# Patient Record
Sex: Male | Born: 1960 | Race: White | Hispanic: No | Marital: Married | Smoking: Never smoker
Health system: Southern US, Community
[De-identification: ages and names within clinical notes are randomized; demographics above are authoritative.]

## PROBLEM LIST (undated history)

## (undated) DIAGNOSIS — I10 Essential (primary) hypertension: Secondary | ICD-10-CM

## (undated) DIAGNOSIS — N200 Calculus of kidney: Secondary | ICD-10-CM

## (undated) DIAGNOSIS — C4491 Basal cell carcinoma of skin, unspecified: Secondary | ICD-10-CM

## (undated) HISTORY — DX: Basal cell carcinoma of skin, unspecified: C44.91

## (undated) HISTORY — PX: ROTATOR CUFF REPAIR: SHX139

---

## 2008-08-04 ENCOUNTER — Ambulatory Visit: Payer: Self-pay | Admitting: Orthopedic Surgery

## 2008-08-04 ENCOUNTER — Emergency Department (HOSPITAL_COMMUNITY): Admission: EM | Admit: 2008-08-04 | Discharge: 2008-08-04 | Payer: Self-pay | Admitting: Orthopedic Surgery

## 2008-08-08 ENCOUNTER — Ambulatory Visit: Payer: Self-pay | Admitting: Orthopedic Surgery

## 2008-08-08 DIAGNOSIS — S61409A Unspecified open wound of unspecified hand, initial encounter: Secondary | ICD-10-CM | POA: Insufficient documentation

## 2008-08-11 ENCOUNTER — Encounter: Payer: Self-pay | Admitting: Orthopedic Surgery

## 2008-08-22 ENCOUNTER — Telehealth: Payer: Self-pay | Admitting: Orthopedic Surgery

## 2008-08-30 ENCOUNTER — Encounter: Payer: Self-pay | Admitting: Orthopedic Surgery

## 2008-09-01 ENCOUNTER — Encounter: Payer: Self-pay | Admitting: Orthopedic Surgery

## 2011-02-11 NOTE — Consult Note (Signed)
NAME:  Steve Smith, Steve Smith NO.:  [id number]   MEDICAL RECORD NO.:  [id number]          PATIENT TYPE:  EMS   LOCATION:  ED                            FACILITY:  [facility]   PHYSICIAN:  Vickki Hearing, M.D.DATE OF BIRTH:  1961-05-02   DATE OF CONSULTATION:  08/04/2008  DATE OF DISCHARGE:  08/04/2008                                 CONSULTATION   CHIEF COMPLAINT:  Laceration right hand.   HISTORY:  This is a 50 year old male who was referred to me by Dr.  Regino Schultze.  The patient's wife works for Dr. Regino Schultze. He lacerated his  hand working with a printer at work and presented to Dr. Regino Schultze and Dr.  Regino Schultze  felt the laceration was too deep for closed in the office.  He  called me and I did not have office hours so I sent the patient to the  emergency room for me to evaluate.   He is essentially healthy with no major medical problems.  No recent  surgery.   REVIEW OF SYSTEMS:  Negative.   ALLERGIES:  No known drug allergies.   PHYSICAL EXAMINATION:  VITAL SIGNS: Stable.  GENERAL:  Appearance was normal.  CARDIOVASCULAR:  Intact.  SKIN:  Was noted for a laceration over the right thenar eminence.  It  was approximately 5 cm.  It was jagged.  It was angular in a triangular  shape.  MUSCULOSKELETAL: It did not appear to violate any tendons,  just muscle.  He had intact opposition and flexion at the IP joint of the right thumb.  There were no sensory deficits.  NEUROLOGIC: The patient was awake and alert, oriented x3.  Mood and  affect were normal.   IMPRESSION:  Laceration, right hand, thenar eminence.   PLAN:  Irrigation and closure.   The patient's tetanus was updated. Antibiotics were given.      Vickki Hearing, M.D.  Electronically Signed     SEH/MEDQ  D:  09/01/2008  T:  09/01/2008  Job:  [id number]

## 2011-02-14 NOTE — Op Note (Signed)
NAME:  LUCCA, BALLO NO.:  [id number]   MEDICAL RECORD NO.:  [id number]          PATIENT TYPE:  EMS   LOCATION:  ED                            FACILITY:  [facility]   PHYSICIAN:  Vickki Hearing, M.D.DATE OF BIRTH:  September 17, 1961   DATE OF PROCEDURE:  09/01/2008  DATE OF DISCHARGE:  08/04/2008                               OPERATIVE REPORT   PREPROCEDURE DIAGNOSIS:  Laceration of right hand.   POSTPROCEDURE DIAGNOSIS:  Laceration of right hand.   PROCEDURE:  Closure and irrigation or right hand laceration.   SURGEON:  Vickki Hearing, M.D.   ASSISTANT:  None.   ANESTHESIA:  Local 1% lidocaine block.   DESCRIPTION OF PROCEDURE:  Informed consent was obtained verbally.  The  wound was irrigated with saline and Betadine.  The hand was prepped, the  skin edges were injected with 1% lidocaine plain and then 3-0 nylon  suture was used to close the wound.  It was dressed sterilely, the  patient was discharged home on p.o. antibiotics with a followup  scheduled in my office to check the wound.      Vickki Hearing, M.D.  Electronically Signed     SEH/MEDQ  D:  09/01/2008  T:  09/01/2008  Job:  [id number]

## 2012-01-03 ENCOUNTER — Encounter (HOSPITAL_COMMUNITY): Payer: Self-pay | Admitting: Emergency Medicine

## 2012-01-03 ENCOUNTER — Emergency Department (HOSPITAL_COMMUNITY)
Admission: EM | Admit: 2012-01-03 | Discharge: 2012-01-03 | Disposition: A | Discharge status: Home / Self Care | Payer: 59 | Attending: Emergency Medicine | Admitting: Emergency Medicine

## 2012-01-03 ENCOUNTER — Emergency Department (HOSPITAL_COMMUNITY): Payer: 59

## 2012-01-03 DIAGNOSIS — R109 Unspecified abdominal pain: Secondary | ICD-10-CM | POA: Insufficient documentation

## 2012-01-03 DIAGNOSIS — N200 Calculus of kidney: Secondary | ICD-10-CM | POA: Insufficient documentation

## 2012-01-03 DIAGNOSIS — I10 Essential (primary) hypertension: Secondary | ICD-10-CM | POA: Insufficient documentation

## 2012-01-03 DIAGNOSIS — R112 Nausea with vomiting, unspecified: Secondary | ICD-10-CM | POA: Insufficient documentation

## 2012-01-03 HISTORY — DX: Calculus of kidney: N20.0

## 2012-01-03 HISTORY — DX: Essential (primary) hypertension: I10

## 2012-01-03 LAB — CBC
Hemoglobin: 15.4 g/dL (ref 13.0–17.0)
MCH: 30.7 pg (ref 26.0–34.0)
MCHC: 34.3 g/dL (ref 30.0–36.0)
MCV: 89.4 fL (ref 78.0–100.0)
Platelets: 227 10*3/uL (ref 150–400)

## 2012-01-03 LAB — BASIC METABOLIC PANEL
BUN: 19 mg/dL (ref 6–23)
Calcium: 9.5 mg/dL (ref 8.4–10.5)
GFR calc Af Amer: 70 mL/min — ABNORMAL LOW (ref >90)
GFR calc non Af Amer: 61 mL/min — ABNORMAL LOW (ref >90)
Potassium: 4.1 mEq/L (ref 3.5–5.1)

## 2012-01-03 LAB — URINALYSIS, ROUTINE W REFLEX MICROSCOPIC
Bilirubin Urine: NEGATIVE
Ketones, ur: NEGATIVE mg/dL
Nitrite: NEGATIVE
Urobilinogen, UA: 0.2 mg/dL (ref 0.0–1.0)

## 2012-01-03 LAB — DIFFERENTIAL
Basophils Relative: 0 % (ref 0–1)
Eosinophils Absolute: 0 10*3/uL (ref 0.0–0.7)
Eosinophils Relative: 1 % (ref 0–5)
Monocytes Relative: 5 % (ref 3–12)
Neutrophils Relative %: 78 % — ABNORMAL HIGH (ref 43–77)

## 2012-01-03 LAB — URINE MICROSCOPIC-ADD ON

## 2012-01-03 MED ORDER — OXYCODONE-ACETAMINOPHEN 5-325 MG PO TABS: 1.0000 | ORAL_TABLET | ORAL | Status: AC | PRN

## 2012-01-03 MED ORDER — HYDROMORPHONE HCL PF 1 MG/ML IJ SOLN
1.0000 mg | Freq: Once | INTRAMUSCULAR | Status: AC
  Administered 2012-01-03: 1 mg via INTRAVENOUS
  Filled 2012-01-03: qty 1

## 2012-01-03 MED ORDER — ONDANSETRON HCL 4 MG/2ML IJ SOLN
4.0000 mg | Freq: Once | INTRAMUSCULAR | Status: AC
  Administered 2012-01-03: 4 mg via INTRAVENOUS
  Filled 2012-01-03: qty 2

## 2012-01-03 MED ORDER — ONDANSETRON HCL 4 MG PO TABS: 4.0000 mg | ORAL_TABLET | Freq: Four times a day (QID) | ORAL | Status: AC

## 2012-01-03 NOTE — ED Provider Notes (Signed)
History     CSN: [id number]  Arrival date & time 01/03/12  4098   First MD Initiated Contact with Patient 01/03/12 1005      Chief Complaint  Patient presents with  . Flank Pain    (Consider location/radiation/quality/duration/timing/severity/associated sxs/prior treatment) HPI Comments: Patient complains of sudden onset of left flank pain that began earlier this morning. States that the pain is sharp and radiates to his left groin. He also reports nausea this morning and 1 episode of vomiting. Patient states that he has had previous kidney stones in his pain this morning feels similar to previous stones. He denies difficulty urinating or hematuria. He also denies fever and abdominal pain.  Patient is a 51 y.o. male presenting with flank pain. The history is provided by the patient.  Flank Pain This is a new problem. The current episode started today. The problem occurs constantly. The problem has been unchanged. Associated symptoms include nausea and vomiting. Pertinent negatives include no abdominal pain, arthralgias, chest pain, coughing, fever, headaches, neck pain, numbness, urinary symptoms or weakness. The symptoms are aggravated by nothing. He has tried nothing for the symptoms. The treatment provided no relief.    Past Medical History  Diagnosis Date  . Hypertension   . Kidney stone     Past Surgical History  Procedure Date  . Rotator cuff repair     Left    History reviewed. No pertinent family history.  History  Substance Use Topics  . Smoking status: Never Smoker   . Smokeless tobacco: Not on file  . Alcohol Use: No      Review of Systems  Constitutional: Negative for fever, activity change and appetite change.  HENT: Negative for neck pain.   Respiratory: Negative for cough and shortness of breath.   Cardiovascular: Negative for chest pain.  Gastrointestinal: Positive for nausea and vomiting. Negative for abdominal pain and diarrhea.  Genitourinary:  Positive for flank pain. Negative for dysuria, hematuria, decreased urine volume, scrotal swelling and difficulty urinating.  Musculoskeletal: Negative for arthralgias.  Skin: Negative.   Neurological: Negative for weakness, numbness and headaches.  All other systems reviewed and are negative.    Allergies  Review of patient's allergies indicates no known allergies.  Home Medications  No current outpatient prescriptions on file.  BP 157/88  Pulse 65  Temp(Src) 98.4 F (36.9 C) (Oral)  Resp 18  Ht 6\' 4"  (1.93 m)  Wt 260 lb (117.935 kg)  BMI 31.65 kg/m2  SpO2 98%  Physical Exam  Nursing note and vitals reviewed. Constitutional: He is oriented to person, place, and time. He appears well-developed and well-nourished. No distress.  HENT:  Head: Normocephalic and atraumatic.  Mouth/Throat: Oropharynx is clear and moist.  Cardiovascular: Normal rate, regular rhythm, normal heart sounds and intact distal pulses.   No murmur heard. Pulmonary/Chest: Effort normal and breath sounds normal. No respiratory distress.  Abdominal: Soft. Normal appearance. He exhibits no distension and no mass. There is tenderness. There is CVA tenderness. There is no rebound and no guarding.  Musculoskeletal: Normal range of motion. He exhibits no edema and no tenderness.  Neurological: He is alert and oriented to person, place, and time. He exhibits normal muscle tone. Coordination normal.  Skin: Skin is warm and dry.    ED Course  Procedures (including critical care time)  Labs Reviewed  URINALYSIS, ROUTINE W REFLEX MICROSCOPIC - Abnormal; Notable for the following:    APPearance HAZY (*)    Specific Gravity, Urine >1.030 (*)  Hgb urine dipstick LARGE (*)    Protein, ur TRACE (*)    All other components within normal limits  DIFFERENTIAL - Abnormal; Notable for the following:    Neutrophils Relative 78 (*)    All other components within normal limits  BASIC METABOLIC PANEL - Abnormal; Notable  for the following:    Glucose, Bld 136 (*)    GFR calc non Af Amer 61 (*)    GFR calc Af Amer 70 (*)    All other components within normal limits  URINE MICROSCOPIC-ADD ON - Abnormal; Notable for the following:    Bacteria, UA FEW (*)    All other components within normal limits  CBC    Ct Abdomen Pelvis Wo Contrast  01/03/2012  *RADIOLOGY REPORT*  Clinical Data: Left-sided flank pain and hematuria.  Nausea and vomiting.  History of renal calculi.  CT ABDOMEN AND PELVIS WITHOUT CONTRAST  Technique:  Multidetector CT imaging of the abdomen and pelvis was performed following the standard protocol without intravenous contrast.  Comparison: None.  Findings: Mild left hydronephrosis and hydroureter present.  There is a calculus located either at the UVJ or just beyond the UVJ into the bladder.  This small calculus measures approximately 3 mm in diameter.  No other renal calculi.  Calcified granuloma noted at the posterior left lung base.  Several small calcified granulomata are also present in the spleen.  Other unenhanced solid organs are unremarkable.  Bowel loops are normal in caliber and show no evidence of obstruction or inflammation. The appendix is normal in appearance.  Small benign mesenteric calcification noted in the anterior pelvis.  No evidence of hernias, masses or enlarged lymph nodes.  Mild degenerative changes of the lower thoracic spine.  IMPRESSION: Mild left hydronephrosis and hydroureter due to a 3 mm calculus located either at the ureterovesicle junction or just in the lumen of the bladder.  Original Report Authenticated By: Reola Calkins, M.D.      MDM   Vital signs are stable. Patient appears uncomfortable. Left flank pain that began suddenly at 3 AM this morning patient has a history of kidney stones and states his pain is similar to previous.  I will treat with IV Dilaudid and Zofran.  Patient has received IVF's, Dilaudid, and zofran.  He is feeling better.  Vitals remain  stable, pain improved.  No vomiting during ed stay.  Pt has a 3mm  Stone at UVJ that he will likely pass.  I will also give referal for urology , he also agrees to return here if sx's do not improve  Patient / Family / Caregiver understand and agree with initial ED impression and plan with expectations set for ED visit. Pt stable in ED with no significant deterioration in condition. Pt feels improved after observation and/or treatment in ED.       Adlyn Fife L. [place], [place] 01/06/12 1633

## 2012-01-03 NOTE — ED Notes (Signed)
Patient with c/o left flank pain starting 0300 today. History of Kidney Stones. +nausea/vomiting

## 2012-01-03 NOTE — Discharge Instructions (Signed)

## 2012-01-06 NOTE — ED Provider Notes (Signed)
Medical screening examination/treatment/procedure(s) were performed by non-physician practitioner and as supervising physician I was immediately available for consultation/collaboration.   Wilhemenia Camba W Arcadia Gorgas, MD 01/06/12 2246 

## 2016-02-13 ENCOUNTER — Encounter (HOSPITAL_COMMUNITY): Payer: Self-pay | Admitting: *Deleted

## 2016-02-13 ENCOUNTER — Emergency Department (HOSPITAL_COMMUNITY)
Admission: EM | Admit: 2016-02-13 | Discharge: 2016-02-13 | Disposition: A | Discharge status: Home / Self Care | Payer: 59 | Attending: Emergency Medicine | Admitting: Emergency Medicine

## 2016-02-13 DIAGNOSIS — R109 Unspecified abdominal pain: Secondary | ICD-10-CM | POA: Diagnosis present

## 2016-02-13 DIAGNOSIS — N200 Calculus of kidney: Secondary | ICD-10-CM

## 2016-02-13 DIAGNOSIS — I10 Essential (primary) hypertension: Secondary | ICD-10-CM | POA: Insufficient documentation

## 2016-02-13 DIAGNOSIS — R112 Nausea with vomiting, unspecified: Secondary | ICD-10-CM | POA: Insufficient documentation

## 2016-02-13 LAB — URINALYSIS, ROUTINE W REFLEX MICROSCOPIC
BILIRUBIN URINE: NEGATIVE
Glucose, UA: NEGATIVE mg/dL
KETONES UR: NEGATIVE mg/dL
LEUKOCYTES UA: NEGATIVE
NITRITE: NEGATIVE
PROTEIN: NEGATIVE mg/dL
Specific Gravity, Urine: 1.025 (ref 1.005–1.030)
pH: 5.5 (ref 5.0–8.0)

## 2016-02-13 LAB — BASIC METABOLIC PANEL
Anion gap: 9 (ref 5–15)
BUN: 24 mg/dL — ABNORMAL HIGH (ref 6–20)
CALCIUM: 9 mg/dL (ref 8.9–10.3)
CHLORIDE: 105 mmol/L (ref 101–111)
CO2: 24 mmol/L (ref 22–32)
CREATININE: 1.33 mg/dL — AB (ref 0.61–1.24)
GFR, EST NON AFRICAN AMERICAN: 60 mL/min — AB (ref >60)
Glucose, Bld: 149 mg/dL — ABNORMAL HIGH (ref 65–99)
Potassium: 3.8 mmol/L (ref 3.5–5.1)
Sodium: 138 mmol/L (ref 135–145)

## 2016-02-13 LAB — URINE MICROSCOPIC-ADD ON
Squamous Epithelial / LPF: NONE SEEN
WBC UA: NONE SEEN WBC/hpf (ref 0–5)

## 2016-02-13 LAB — CBC WITH DIFFERENTIAL/PLATELET
BASOS PCT: 0 %
Basophils Absolute: 0 10*3/uL (ref 0.0–0.1)
Eosinophils Absolute: 0.1 10*3/uL (ref 0.0–0.7)
Eosinophils Relative: 1 %
HEMATOCRIT: 43.9 % (ref 39.0–52.0)
HEMOGLOBIN: 14.8 g/dL (ref 13.0–17.0)
LYMPHS ABS: 2 10*3/uL (ref 0.7–4.0)
LYMPHS PCT: 26 %
MCH: 30.3 pg (ref 26.0–34.0)
MCHC: 33.7 g/dL (ref 30.0–36.0)
MCV: 90 fL (ref 78.0–100.0)
MONOS PCT: 5 %
Monocytes Absolute: 0.4 10*3/uL (ref 0.1–1.0)
NEUTROS ABS: 5.3 10*3/uL (ref 1.7–7.7)
NEUTROS PCT: 68 %
Platelets: 209 10*3/uL (ref 150–400)
RBC: 4.88 MIL/uL (ref 4.22–5.81)
RDW: 13.4 % (ref 11.5–15.5)
WBC: 7.7 10*3/uL (ref 4.0–10.5)

## 2016-02-13 MED ORDER — KETOROLAC TROMETHAMINE 30 MG/ML IJ SOLN
30.0000 mg | Freq: Once | INTRAMUSCULAR | Status: AC
  Administered 2016-02-13: 30 mg via INTRAVENOUS
  Filled 2016-02-13: qty 1

## 2016-02-13 MED ORDER — ONDANSETRON HCL 4 MG/2ML IJ SOLN
4.0000 mg | Freq: Once | INTRAMUSCULAR | Status: AC
  Administered 2016-02-13: 4 mg via INTRAVENOUS

## 2016-02-13 MED ORDER — HYDROMORPHONE HCL 1 MG/ML IJ SOLN
1.0000 mg | Freq: Once | INTRAMUSCULAR | Status: AC
  Administered 2016-02-13: 1 mg via INTRAVENOUS

## 2016-02-13 MED ORDER — ONDANSETRON 4 MG PO TBDP: 4.0000 mg | ORAL_TABLET | Freq: Three times a day (TID) | ORAL | Status: DC | PRN

## 2016-02-13 MED ORDER — OXYCODONE-ACETAMINOPHEN 5-325 MG PO TABS
1.0000 | ORAL_TABLET | Freq: Once | ORAL | Status: AC
  Administered 2016-02-13: 1 via ORAL
  Filled 2016-02-13: qty 1

## 2016-02-13 MED ORDER — TAMSULOSIN HCL 0.4 MG PO CAPS: 0.4000 mg | ORAL_CAPSULE | Freq: Every day | ORAL | Status: DC

## 2016-02-13 MED ORDER — OXYCODONE-ACETAMINOPHEN 5-325 MG PO TABS: 1.0000 | ORAL_TABLET | Freq: Four times a day (QID) | ORAL | Status: DC | PRN

## 2016-02-13 NOTE — ED Provider Notes (Signed)
CSN: [id number]     Arrival date & time 02/13/16  0115 History   None    Chief Complaint  Patient presents with  . Flank Pain     (Consider location/radiation/quality/duration/timing/severity/associated sxs/prior Treatment) HPI  This is a 55 year old male with a history of hypertension and kidney stones who presents with right flank pain. Onset of symptoms tonight. He reports pain that originates in his right flank and radiates into his right groin. He states that it is sharp. It is currently 10 out of 10. Nothing seems to make it better or worse. He denies any hematuria or dysuria. This feels like his prior kidney stones. Denies any fevers. Does report vomiting. No diarrhea or other symptoms.  Past Medical History  Diagnosis Date  . Hypertension   . Kidney stone    Past Surgical History  Procedure Laterality Date  . Rotator cuff repair      Left   History reviewed. No pertinent family history. Social History  Substance Use Topics  . Smoking status: Never Smoker   . Smokeless tobacco: None  . Alcohol Use: No    Review of Systems  Constitutional: Negative.  Negative for fever.  Respiratory: Negative.  Negative for chest tightness and shortness of breath.   Cardiovascular: Negative.  Negative for chest pain.  Gastrointestinal: Positive for nausea and vomiting. Negative for abdominal pain.  Genitourinary: Positive for flank pain. Negative for dysuria and hematuria.  All other systems reviewed and are negative.     Allergies  Review of patient's allergies indicates no known allergies.  Home Medications   Prior to Admission medications   Medication Sig Start Date End Date Taking? Authorizing Provider  fish oil-omega-3 fatty acids 1000 MG capsule Take 1 g by mouth daily.    Historical Provider, MD  olmesartan (BENICAR) 20 MG tablet Take 20 mg by mouth daily.    Historical Provider, MD  ondansetron (ZOFRAN ODT) 4 MG disintegrating tablet Take 1 tablet (4 mg total) by  mouth every 8 (eight) hours as needed for nausea or vomiting. 02/13/16   Merryl Hacker, MD  oxyCODONE-acetaminophen (PERCOCET/ROXICET) 5-325 MG tablet Take 1 tablet by mouth every 6 (six) hours as needed for severe pain. 02/13/16   Merryl Hacker, MD  tamsulosin (FLOMAX) 0.4 MG CAPS capsule Take 1 capsule (0.4 mg total) by mouth daily. 02/13/16   Merryl Hacker, MD   BP 153/91 mmHg  Pulse 53  Temp(Src) 97.7 F (36.5 C) (Oral)  Resp 20  SpO2 98% Physical Exam  Constitutional: He is oriented to person, place, and time.  Uncomfortable appearing, diaphoretic  HENT:  Head: Normocephalic and atraumatic.  Cardiovascular: Normal rate, regular rhythm and normal heart sounds.   No murmur heard. Pulmonary/Chest: Effort normal and breath sounds normal. No respiratory distress. He has no wheezes.  Abdominal: Soft. Bowel sounds are normal. There is no tenderness. There is no rebound.  Musculoskeletal: He exhibits no edema.  Neurological: He is alert and oriented to person, place, and time.  Skin: Skin is warm and dry.  Psychiatric: He has a normal mood and affect.  Nursing note and vitals reviewed.   ED Course  Procedures (including critical care time) Labs Review Labs Reviewed  BASIC METABOLIC PANEL - Abnormal; Notable for the following:    Glucose, Bld 149 (*)    BUN 24 (*)    Creatinine, Ser 1.33 (*)    GFR calc non Af Amer 60 (*)    All other components within normal  limits  CBC WITH DIFFERENTIAL/PLATELET  URINALYSIS, ROUTINE W REFLEX MICROSCOPIC (NOT AT [facility])    Imaging Review No results found. I have personally reviewed and evaluated these images and lab results as part of my medical decision-making.   EKG Interpretation None      MDM   Final diagnoses:  Kidney stone    This 55 year old male who presents with right flank pain. Uncomfortable appearing on exam but nontoxic. ABCs intact. Vital signs reassuring. No CVA tenderness. Exam is otherwise unremarkable.  History of stones. Patient was given fluids and pain medication. Lab work is largely reassuring. We'll defer imaging at this time given known history of stones. On recheck, patient is more comfortable appearing after one dose of Toradol. Will discharge home with expectant management and urology follow-up.  After history, exam, and medical workup I feel the patient has been appropriately medically screened and is safe for discharge home. Pertinent diagnoses were discussed with the patient. Patient was given return precautions.     Merryl Hacker, MD 02/13/16 [phone]

## 2016-02-13 NOTE — Discharge Instructions (Signed)
You were seen today and likely have a kidney stone. You will be treated for a kidney stone. If you develop fever, worsening symptoms she needs to be reevaluated and may need further imaging.  Kidney Stones Kidney stones (urolithiasis) are deposits that form inside your kidneys. The intense pain is caused by the stone moving through the urinary tract. When the stone moves, the ureter goes into spasm around the stone. The stone is usually passed in the urine.  CAUSES   A disorder that makes certain neck glands produce too much parathyroid hormone (primary hyperparathyroidism).  A buildup of uric acid crystals, similar to gout in your joints.  Narrowing (stricture) of the ureter.  A kidney obstruction present at birth (congenital obstruction).  Previous surgery on the kidney or ureters.  Numerous kidney infections. SYMPTOMS   Feeling sick to your stomach (nauseous).  Throwing up (vomiting).  Blood in the urine (hematuria).  Pain that usually spreads (radiates) to the groin.  Frequency or urgency of urination. DIAGNOSIS   Taking a history and physical exam.  Blood or urine tests.  CT scan.  Occasionally, an examination of the inside of the urinary bladder (cystoscopy) is performed. TREATMENT   Observation.  Increasing your fluid intake.  Extracorporeal shock wave lithotripsy--This is a noninvasive procedure that uses shock waves to break up kidney stones.  Surgery may be needed if you have severe pain or persistent obstruction. There are various surgical procedures. Most of the procedures are performed with the use of small instruments. Only small incisions are needed to accommodate these instruments, so recovery time is minimized. The size, location, and chemical composition are all important variables that will determine the proper choice of action for you. Talk to your health care provider to better understand your situation so that you will minimize the risk of injury to  yourself and your kidney.  HOME CARE INSTRUCTIONS   Drink enough water and fluids to keep your urine clear or pale yellow. This will help you to pass the stone or stone fragments.  Strain all urine through the provided strainer. Keep all particulate matter and stones for your health care provider to see. The stone causing the pain may be as small as a grain of salt. It is very important to use the strainer each and every time you pass your urine. The collection of your stone will allow your health care provider to analyze it and verify that a stone has actually passed. The stone analysis will often identify what you can do to reduce the incidence of recurrences.  Only take over-the-counter or prescription medicines for pain, discomfort, or fever as directed by your health care provider.  Keep all follow-up visits as told by your health care provider. This is important.  Get follow-up X-rays if required. The absence of pain does not always mean that the stone has passed. It may have only stopped moving. If the urine remains completely obstructed, it can cause loss of kidney function or even complete destruction of the kidney. It is your responsibility to make sure X-rays and follow-ups are completed. Ultrasounds of the kidney can show blockages and the status of the kidney. Ultrasounds are not associated with any radiation and can be performed easily in a matter of minutes.  Make changes to your daily diet as told by your health care provider. You may be told to:  Limit the amount of salt that you eat.  Eat 5 or more servings of fruits and vegetables each day.  Limit the amount of meat, poultry, fish, and eggs that you eat.  Collect a 24-hour urine sample as told by your health care provider.You may need to collect another urine sample every 6-12 months. SEEK MEDICAL CARE IF:  You experience pain that is progressive and unresponsive to any pain medicine you have been prescribed. SEEK  IMMEDIATE MEDICAL CARE IF:   Pain cannot be controlled with the prescribed medicine.  You have a fever or shaking chills.  The severity or intensity of pain increases over 18 hours and is not relieved by pain medicine.  You develop a new onset of abdominal pain.  You feel faint or pass out.  You are unable to urinate.   This information is not intended to replace advice given to you by your health care provider. Make sure you discuss any questions you have with your health care provider.   Document Released: 09/15/2005 Document Revised: 06/06/2015 Document Reviewed: 02/16/2013 Elsevier Interactive Patient Education [employer].

## 2016-02-13 NOTE — ED Notes (Signed)
Pt c/o severe right flank pain with vomiting; pt has hx of kidney stones

## 2016-10-10 DIAGNOSIS — D1801 Hemangioma of skin and subcutaneous tissue: Secondary | ICD-10-CM | POA: Diagnosis not present

## 2016-10-10 DIAGNOSIS — Z85828 Personal history of other malignant neoplasm of skin: Secondary | ICD-10-CM | POA: Diagnosis not present

## 2016-10-10 DIAGNOSIS — D2261 Melanocytic nevi of right upper limb, including shoulder: Secondary | ICD-10-CM | POA: Diagnosis not present

## 2017-03-20 DIAGNOSIS — Z23 Encounter for immunization: Secondary | ICD-10-CM | POA: Diagnosis not present

## 2017-03-20 DIAGNOSIS — S91341A Puncture wound with foreign body, right foot, initial encounter: Secondary | ICD-10-CM | POA: Diagnosis not present

## 2017-03-27 DIAGNOSIS — Z1211 Encounter for screening for malignant neoplasm of colon: Secondary | ICD-10-CM | POA: Diagnosis not present

## 2017-07-03 DIAGNOSIS — I10 Essential (primary) hypertension: Secondary | ICD-10-CM | POA: Diagnosis not present

## 2017-11-06 DIAGNOSIS — C4441 Basal cell carcinoma of skin of scalp and neck: Secondary | ICD-10-CM | POA: Diagnosis not present

## 2017-11-06 DIAGNOSIS — D225 Melanocytic nevi of trunk: Secondary | ICD-10-CM | POA: Diagnosis not present

## 2017-11-06 DIAGNOSIS — L821 Other seborrheic keratosis: Secondary | ICD-10-CM | POA: Diagnosis not present

## 2017-11-06 DIAGNOSIS — L570 Actinic keratosis: Secondary | ICD-10-CM | POA: Diagnosis not present

## 2017-11-06 DIAGNOSIS — Z85828 Personal history of other malignant neoplasm of skin: Secondary | ICD-10-CM | POA: Diagnosis not present

## 2018-06-04 DIAGNOSIS — N401 Enlarged prostate with lower urinary tract symptoms: Secondary | ICD-10-CM | POA: Diagnosis not present

## 2018-06-04 DIAGNOSIS — E6609 Other obesity due to excess calories: Secondary | ICD-10-CM | POA: Diagnosis not present

## 2018-06-04 DIAGNOSIS — Z0001 Encounter for general adult medical examination with abnormal findings: Secondary | ICD-10-CM | POA: Diagnosis not present

## 2018-10-22 DIAGNOSIS — Z6835 Body mass index (BMI) 35.0-35.9, adult: Secondary | ICD-10-CM | POA: Diagnosis not present

## 2018-10-22 DIAGNOSIS — E6609 Other obesity due to excess calories: Secondary | ICD-10-CM | POA: Diagnosis not present

## 2018-10-22 DIAGNOSIS — J209 Acute bronchitis, unspecified: Secondary | ICD-10-CM | POA: Diagnosis not present

## 2018-11-12 DIAGNOSIS — Z85828 Personal history of other malignant neoplasm of skin: Secondary | ICD-10-CM | POA: Diagnosis not present

## 2018-11-12 DIAGNOSIS — D2262 Melanocytic nevi of left upper limb, including shoulder: Secondary | ICD-10-CM | POA: Diagnosis not present

## 2018-11-12 DIAGNOSIS — L570 Actinic keratosis: Secondary | ICD-10-CM | POA: Diagnosis not present

## 2018-11-12 DIAGNOSIS — C44319 Basal cell carcinoma of skin of other parts of face: Secondary | ICD-10-CM | POA: Diagnosis not present

## 2018-11-12 DIAGNOSIS — D2261 Melanocytic nevi of right upper limb, including shoulder: Secondary | ICD-10-CM | POA: Diagnosis not present

## 2020-12-14 ENCOUNTER — Other Ambulatory Visit: Payer: Self-pay | Admitting: Internal Medicine

## 2020-12-14 DIAGNOSIS — R1904 Left lower quadrant abdominal swelling, mass and lump: Secondary | ICD-10-CM

## 2020-12-20 ENCOUNTER — Other Ambulatory Visit: Payer: Self-pay

## 2020-12-20 ENCOUNTER — Encounter: Payer: Self-pay | Admitting: General Surgery

## 2020-12-20 ENCOUNTER — Ambulatory Visit: Payer: 59 | Admitting: General Surgery

## 2020-12-20 ENCOUNTER — Encounter (INDEPENDENT_AMBULATORY_CARE_PROVIDER_SITE_OTHER): Payer: Self-pay

## 2020-12-20 VITALS — BP 142/90 | HR 84 | Temp 96.9°F | Resp 12 | Ht 75.0 in | Wt 267.0 lb

## 2020-12-20 DIAGNOSIS — R590 Localized enlarged lymph nodes: Secondary | ICD-10-CM | POA: Insufficient documentation

## 2020-12-20 NOTE — Patient Instructions (Signed)
Open Lymph Node Biopsy An open lymph node biopsy is a procedure to remove a lymph node so that it can be checked for disease. Lymph nodes are part of the body's disease-fighting system (immune system). The immune system protects the body from infections, germs, and diseases. An open lymph node biopsy may be done to:  Look for germs or cancer cells in your lymph node.  Find out why your lymph node is swollen.  Find out more about a condition you have. Lymph nodes are found in many locations in the body. Biopsies are often done on lymph nodes in the head, neck, armpit, or groin. Tell a health care provider about:  Any allergies you have.  All medicines you are taking, including vitamins, herbs, eye drops, creams, and over-the-counter medicines.  Any problems you or family members have had with anesthetic medicines.  Any blood disorders you have.  Any surgeries you have had.  Any medical conditions you have or have had.  Whether you are pregnant or may be pregnant. What are the risks? Generally, this is a safe procedure. However, problems may occur, including:  Infection.  Bleeding.  Allergic reactions to medicines.  Damage to surrounding structures or organs, such as a nerve.  Scarring. What happens before the procedure? Medicines Ask your health care provider about:  Changing or stopping your regular medicines. This is especially important if you are taking diabetes medicines or blood thinners.  Taking medicines such as aspirin and ibuprofen. These medicines can thin your blood. Do not take these medicines before the procedure unless your health care provider tells you to take them.  Taking over-the-counter medicines, vitamins, herbs, and supplements. Surgery safety Ask your health care provider:  How your surgery site will be marked.  What steps will be taken to help prevent infection. These steps may include: ? Removing hair at the surgery site. ? Washing skin  with a germ-killing soap. ? Receiving antibiotic medicine. General instructions  Follow instructions from your health care provider about eating or drinking restrictions.  You may have an exam or testing.  You may have a blood or urine sample taken.  If you will be going home right after the procedure, plan to have a responsible adult care for you for the time you are told. This is important. What happens during the procedure?  An IV will be inserted into one of your veins.  You will be given one or more of the following: ? A medicine to help you relax (sedative). ? A medicine to numb the area (local anesthetic).  An incision will be made in the area where your lymph node is located.  Your lymph node will be removed.  Your incision will be closed with stitches (sutures).  An antibiotic ointment may be applied to your incision.  A bandage (dressing) will be placed over your incision. The procedure may vary among health care providers and hospitals.   What happens after the procedure?  Your blood pressure, heart rate, breathing rate, and blood oxygen level will be monitored until you leave the hospital or clinic.  Do not drive for 24 hours if you were given a sedative during your procedure.  It is up to you to get the results of your procedure. Ask your health care provider, or the department that is doing the procedure, when your results will be ready. Summary  An open lymph node biopsy is a procedure to remove a lymph node so that it can be checked for   disease.  Generally, this is a safe procedure. However, problems may occur, including bleeding, infection, allergic reaction to medicines, and damage to other structures or organs.  During the procedure, an incision will be made in the area of the lymph node, the lymph node will be removed, and the incision will be closed with sutures.  You will be monitored after the procedure. Do not drive for 24 hours if you were given a  sedative during your procedure. This information is not intended to replace advice given to you by your health care provider. Make sure you discuss any questions you have with your health care provider. Document Revised: 06/28/2020 Document Reviewed: 06/28/2020 Elsevier Patient Education  2021 Elsevier Inc.  

## 2020-12-20 NOTE — Progress Notes (Signed)
[facility] History and Physical  Reason for Referral: Left inguinal adenopathy  Referring Physician:  Redmond School, Steve Smith  Chief Complaint    New Patient (Initial Visit)      Steve Smith is a 60 y.o. male.  HPI:  Steve Smith is a 60 yo who has noticed a lump in the left groin since about January and it as getting larger. He first noted it while he was bathing. He has been worked up by his PCP and a CT was obtained that demonstrates a 3cm lymph node in addition to splenic changes concerning for lymphoma. He was sent for urgent lymph node excision.   He is otherwise healthy and takes minimal medications. He is here today with his wife.   He denies any night sweats or B type symptoms.   Past Medical History:  Diagnosis Date  . Basal cell carcinoma   . Hypertension   . Kidney stone     Past Surgical History:  Procedure Laterality Date  . ROTATOR CUFF REPAIR     Left    No family history on file.  Social History   Tobacco Use  . Smoking status: Never Smoker  Substance Use Topics  . Alcohol use: No  . Drug use: No    Medications: I have reviewed the patient's current medications. Allergies as of 12/20/2020   No Known Allergies     Medication List       Accurate as of December 20, 2020  1:30 PM. If you have any questions, ask your nurse or doctor.        STOP taking these medications   fish oil-omega-3 fatty acids 1000 MG capsule Stopped by: Steve Cagey, Steve Smith   ondansetron 4 MG disintegrating tablet Commonly known as: Zofran ODT Stopped by: Steve Cagey, Steve Smith   oxyCODONE-acetaminophen 5-325 MG tablet Commonly known as: PERCOCET/ROXICET Stopped by: Steve Cagey, Steve Smith   tamsulosin 0.4 MG Caps capsule Commonly known as: Flomax Stopped by: Steve Cagey, Steve Smith     TAKE these medications   olmesartan 20 MG tablet Commonly known as: BENICAR Take 20 mg by mouth daily.        ROS:  A comprehensive review of systems was  negative except for: left inguinal adenopathy  Blood pressure (!) 142/90, pulse 84, temperature (!) 96.9 F (36.1 C), temperature source Other (Comment), resp. rate 12, height 6\' 3"  (1.905 m), weight 267 lb (121.1 kg), SpO2 96 %. Physical Exam Constitutional:      Appearance: Normal appearance.  HENT:     Head: Normocephalic.     Nose: Nose normal.  Eyes:     Extraocular Movements: Extraocular movements intact.  Cardiovascular:     Rate and Rhythm: Normal rate and regular rhythm.  Pulmonary:     Effort: Pulmonary effort is normal.     Breath sounds: Normal breath sounds.  Abdominal:     General: There is no distension.     Palpations: Abdomen is soft.     Tenderness: There is no abdominal tenderness.  Musculoskeletal:        General: Normal range of motion.     Cervical back: Normal range of motion.  Lymphadenopathy:     Lower Body: Left inguinal adenopathy present.     Comments: Superficial above the inguinal crease towards mons area  Skin:    General: Skin is warm.  Neurological:     General: No focal deficit present.     Mental Status: He  is alert and oriented to person, place, and time.  Psychiatric:        Mood and Affect: Mood normal.        Behavior: Behavior normal.        Thought Content: Thought content normal.        Judgment: Judgment normal.     Results: OSH imaging     Assessment & Plan:  Steve Smith is a 60 y.o. male with left inguinal adenopathy confirmed on exam and CT. Plans for excisional biopsy. Discussed the risk of bleeding, infection, nondiagnostic findings. Discussed seroma risk. Discussed COVID testing preop.    All questions were answered to the satisfaction of the patient and family.     Steve Smith 12/20/2020, 1:30 PM

## 2020-12-24 NOTE — Patient Instructions (Signed)
Steve Smith  12/24/2020     @PREFPERIOPPHARMACY @   Your procedure is scheduled on  12/26/2020.   Report to [facility] at  0730  A.M.   Call this number if you have problems the morning of surgery:  [phone]   Remember:  Do not eat or drink after midnight.                         Take these medicines the morning of surgery with A SIP OF WATER  None.  Place clean sheets on your bed the night before your procedure and DO NOT sleep with pets this night,  Shower with CHG the night before and the morning of your procedure. DO NOT put CHG on your face, hair or genitals.  After each shower, dry off with a clean towel, put on clean, comfortable clothes and brush your teeth.      Do not wear jewelry, make-up or nail polish.  Do not wear lotions, powders, or perfumes, or deodorant.  Do not shave 48 hours prior to surgery.  Men may shave face and neck.  Do not bring valuables to the hospital.  [facility] is not responsible for any belongings or valuables.   Contacts, dentures or bridgework may not be worn into surgery.  Leave your suitcase in the car.  After surgery it may be brought to your room.  For patients admitted to the hospital, discharge time will be determined by your treatment team.  Patients discharged the day of surgery will not be allowed to drive home and must have someone with them for 24 hours.   Special instructions:  DO NOT smoke tobacco or vape the morning of your procedure.   Please read over the following fact sheets that you were given. Coughing and Deep Breathing, Surgical Site Infection Prevention, Anesthesia Post-op Instructions and Care and Recovery After Surgery       Open Lymph Node Biopsy, Care After The following information offers guidance on how to care for yourself after your procedure. Your health care provider may also give you more specific instructions. If you have problems or questions, contact your health care  provider. What can I expect after the procedure? After the procedure, it is common to have:  Bruising.  Soreness.  Mild swelling. Follow these instructions at home: Medicines  Take over-the-counter and prescription medicines only as told by your health care provider.  If you were prescribed an antibiotic medicine, take or use it as told by your health care provider. Do not stop taking the antibiotic even if you start to feel better.  Do not drive or use heavy machinery while taking prescription pain medicine. Incision care  Follow instructions from your health care provider about how to take care of your incision. Make sure you: ? Wash your hands with soap and water for at least 20 seconds before and after you change your bandage (dressing). If soap and water are not available, use hand sanitizer. ? Change your dressing as told by your health care provider. ? Leave stitches (sutures), skin glue, or adhesive strips in place. These skin closures may need to stay in place for 2 weeks or longer. If adhesive strip edges start to loosen and curl up, you may trim the loose edges. Do not remove adhesive strips completely unless your health care provider tells you to do that.  Check your incision area every day for signs of  infection. Check for: ? More redness, swelling, or pain. ? Fluid or blood. ? Warmth. ? Pus or a bad smell.   General instructions  Do not take baths, swim, or use a hot tub until your health care provider approves. Ask your health care provider if you may take showers. You may only be allowed to take sponge baths.  If you were given a sedative during the procedure, it can affect you for several hours. Do not drive or operate machinery until your health care provider says that it is safe.  Return to your normal activities as told by your health care provider. Ask your health care provider what activities are safe for you.  Keep all follow-up visits. This is  important. Contact a health care provider if:  You have a fever.  You have increased redness, swelling, or pain around your incision.  You have fluid or blood coming from your incision.  Your incision feels warm to the touch.  You have pus or a bad smell coming from your incision.  You have pain or numbness that gets worse or lasts longer than a few days. Summary  After a lymph node biopsy, it is common to have bruising, soreness, and mild swelling.  Follow your health care provider's instructions about taking care of yourself at home. You will be told how to take medicines, take care of your incision, and check for infection.  Return to your normal activities as told by your health care provider. Ask your health care provider what activities are safe for you.  Contact a health care provider if you have increased redness, swelling, or pain around your incision, you have a fever, or you have worsening pain or numbness. This information is not intended to replace advice given to you by your health care provider. Make sure you discuss any questions you have with your health care provider. Document Revised: 06/28/2020 Document Reviewed: 06/28/2020 Elsevier Patient Education  2021 [employer] Anesthesia, Adult, Care After This sheet gives you information about how to care for yourself after your procedure. Your health care provider may also give you more specific instructions. If you have problems or questions, contact your health care provider. What can I expect after the procedure? After the procedure, the following side effects are common:  Pain or discomfort at the IV site.  Nausea.  Vomiting.  Sore throat.  Trouble concentrating.  Feeling cold or chills.  Feeling weak or tired.  Sleepiness and fatigue.  Soreness and body aches. These side effects can affect parts of the body that were not involved in surgery. Follow these instructions at home: For the time  period you were told by your health care provider:  Rest.  Do not participate in activities where you could fall or become injured.  Do not drive or use machinery.  Do not drink alcohol.  Do not take sleeping pills or medicines that cause drowsiness.  Do not make important decisions or sign legal documents.  Do not take care of children on your own.   Eating and drinking  Follow any instructions from your health care provider about eating or drinking restrictions.  When you feel hungry, start by eating small amounts of foods that are soft and easy to digest (bland), such as toast. Gradually return to your regular diet.  Drink enough fluid to keep your urine pale yellow.  If you vomit, rehydrate by drinking water, juice, or clear broth. General instructions  If you have sleep apnea,  surgery and certain medicines can increase your risk for breathing problems. Follow instructions from your health care provider about wearing your sleep device: ? Anytime you are sleeping, including during daytime naps. ? While taking prescription pain medicines, sleeping medicines, or medicines that make you drowsy.  Have a responsible adult stay with you for the time you are told. It is important to have someone help care for you until you are awake and alert.  Return to your normal activities as told by your health care provider. Ask your health care provider what activities are safe for you.  Take over-the-counter and prescription medicines only as told by your health care provider.  If you smoke, do not smoke without supervision.  Keep all follow-up visits as told by your health care provider. This is important. Contact a health care provider if:  You have nausea or vomiting that does not get better with medicine.  You cannot eat or drink without vomiting.  You have pain that does not get better with medicine.  You are unable to pass urine.  You develop a skin rash.  You have a  fever.  You have redness around your IV site that gets worse. Get help right away if:  You have difficulty breathing.  You have chest pain.  You have blood in your urine or stool, or you vomit blood. Summary  After the procedure, it is common to have a sore throat or nausea. It is also common to feel tired.  Have a responsible adult stay with you for the time you are told. It is important to have someone help care for you until you are awake and alert.  When you feel hungry, start by eating small amounts of foods that are soft and easy to digest (bland), such as toast. Gradually return to your regular diet.  Drink enough fluid to keep your urine pale yellow.  Return to your normal activities as told by your health care provider. Ask your health care provider what activities are safe for you. This information is not intended to replace advice given to you by your health care provider. Make sure you discuss any questions you have with your health care provider. Document Revised: 05/31/2020 Document Reviewed: 12/29/2019 Elsevier Patient Education  2021 [employer].

## 2020-12-24 NOTE — H&P (Signed)
[facility] History and Physical  Reason for Referral: Left inguinal adenopathy  Referring Physician:  Redmond School, MD  Chief Complaint    New Patient (Initial Visit)      Steve Smith is a 60 y.o. male.  HPI:  Steve Smith is a 60 yo who has noticed a lump in the left groin since about January and it as getting larger. He first noted it while he was bathing. He has been worked up by his PCP and a CT was obtained that demonstrates a 3cm lymph node in addition to splenic changes concerning for lymphoma. He was sent for urgent lymph node excision.   He is otherwise healthy and takes minimal medications. He is here today with his wife.   He denies any night sweats or B type symptoms.   Past Medical History:  Diagnosis Date  . Basal cell carcinoma   . Hypertension   . Kidney stone     Past Surgical History:  Procedure Laterality Date  . ROTATOR CUFF REPAIR     Left    No family history on file.  Social History   Tobacco Use  . Smoking status: Never Smoker  Substance Use Topics  . Alcohol use: No  . Drug use: No    Medications: I have reviewed the patient's current medications. Allergies as of 12/20/2020   No Known Allergies     Medication List       Accurate as of December 20, 2020  1:30 PM. If you have any questions, ask your nurse or doctor.        STOP taking these medications   fish oil-omega-3 fatty acids 1000 MG capsule Stopped by: Virl Cagey, MD   ondansetron 4 MG disintegrating tablet Commonly known as: Zofran ODT Stopped by: Virl Cagey, MD   oxyCODONE-acetaminophen 5-325 MG tablet Commonly known as: PERCOCET/ROXICET Stopped by: Virl Cagey, MD   tamsulosin 0.4 MG Caps capsule Commonly known as: Flomax Stopped by: Virl Cagey, MD     TAKE these medications   olmesartan 20 MG tablet Commonly known as: BENICAR Take 20 mg by mouth daily.        ROS:  A comprehensive review of systems was  negative except for: left inguinal adenopathy  Blood pressure (!) 142/90, pulse 84, temperature (!) 96.9 F (36.1 C), temperature source Other (Comment), resp. rate 12, height 6\' 3"  (1.905 m), weight 267 lb (121.1 kg), SpO2 96 %. Physical Exam Constitutional:      Appearance: Normal appearance.  HENT:     Head: Normocephalic.     Nose: Nose normal.  Eyes:     Extraocular Movements: Extraocular movements intact.  Cardiovascular:     Rate and Rhythm: Normal rate and regular rhythm.  Pulmonary:     Effort: Pulmonary effort is normal.     Breath sounds: Normal breath sounds.  Abdominal:     General: There is no distension.     Palpations: Abdomen is soft.     Tenderness: There is no abdominal tenderness.  Musculoskeletal:        General: Normal range of motion.     Cervical back: Normal range of motion.  Lymphadenopathy:     Lower Body: Left inguinal adenopathy present.     Comments: Superficial above the inguinal crease towards mons area  Skin:    General: Skin is warm.  Neurological:     General: No focal deficit present.     Mental Status: He  is alert and oriented to person, place, and time.  Psychiatric:        Mood and Affect: Mood normal.        Behavior: Behavior normal.        Thought Content: Thought content normal.        Judgment: Judgment normal.     Results: OSH imaging     Assessment & Plan:  [name] is a 60 y.o. male with left inguinal adenopathy confirmed on exam and CT. Plans for excisional biopsy. Discussed the risk of bleeding, infection, nondiagnostic findings. Discussed seroma risk. Discussed COVID testing preop.    All questions were answered to the satisfaction of the patient and family.     Virl Cagey 12/20/2020, 1:30 PM

## 2020-12-25 ENCOUNTER — Other Ambulatory Visit: Payer: Self-pay

## 2020-12-25 ENCOUNTER — Encounter (HOSPITAL_COMMUNITY)
Admission: RE | Admit: 2020-12-25 | Discharge: 2020-12-25 | Disposition: A | Discharge status: Home / Self Care | Payer: 59 | Source: Ambulatory Visit | Attending: General Surgery | Admitting: General Surgery

## 2020-12-25 ENCOUNTER — Other Ambulatory Visit (HOSPITAL_COMMUNITY)
Admission: RE | Admit: 2020-12-25 | Discharge: 2020-12-25 | Disposition: A | Discharge status: Home / Self Care | Payer: 59 | Source: Ambulatory Visit | Attending: General Surgery | Admitting: General Surgery

## 2020-12-25 ENCOUNTER — Encounter (HOSPITAL_COMMUNITY): Payer: Self-pay

## 2020-12-25 DIAGNOSIS — Z20822 Contact with and (suspected) exposure to covid-19: Secondary | ICD-10-CM | POA: Diagnosis not present

## 2020-12-25 DIAGNOSIS — Z01818 Encounter for other preprocedural examination: Secondary | ICD-10-CM | POA: Insufficient documentation

## 2020-12-25 LAB — CBC WITH DIFFERENTIAL/PLATELET
Abs Immature Granulocytes: 0.01 10*3/uL (ref 0.00–0.07)
Basophils Absolute: 0 10*3/uL (ref 0.0–0.1)
Basophils Relative: 1 %
Eosinophils Absolute: 0.3 10*3/uL (ref 0.0–0.5)
Eosinophils Relative: 5 %
HCT: 46.6 % (ref 39.0–52.0)
Hemoglobin: 15.6 g/dL (ref 13.0–17.0)
Immature Granulocytes: 0 %
Lymphocytes Relative: 30 %
Lymphs Abs: 1.5 10*3/uL (ref 0.7–4.0)
MCH: 30.2 pg (ref 26.0–34.0)
MCHC: 33.5 g/dL (ref 30.0–36.0)
MCV: 90.1 fL (ref 80.0–100.0)
Monocytes Absolute: 0.3 10*3/uL (ref 0.1–1.0)
Monocytes Relative: 6 %
Neutro Abs: 3 10*3/uL (ref 1.7–7.7)
Neutrophils Relative %: 58 %
Platelets: 210 10*3/uL (ref 150–400)
RBC: 5.17 MIL/uL (ref 4.22–5.81)
RDW: 12.6 % (ref 11.5–15.5)
WBC: 5.2 10*3/uL (ref 4.0–10.5)
nRBC: 0 % (ref 0.0–0.2)

## 2020-12-25 LAB — BASIC METABOLIC PANEL
Anion gap: 10 (ref 5–15)
BUN: 16 mg/dL (ref 6–20)
CO2: 22 mmol/L (ref 22–32)
Calcium: 9.3 mg/dL (ref 8.9–10.3)
Chloride: 108 mmol/L (ref 98–111)
Creatinine, Ser: 1.11 mg/dL (ref 0.61–1.24)
GFR, Estimated: >60 mL/min (ref >60)
Glucose, Bld: 97 mg/dL (ref 70–99)
Potassium: 3.9 mmol/L (ref 3.5–5.1)
Sodium: 140 mmol/L (ref 135–145)

## 2020-12-25 LAB — SARS CORONAVIRUS 2 (TAT 6-24 HRS): SARS Coronavirus 2: NEGATIVE

## 2020-12-26 ENCOUNTER — Ambulatory Visit (HOSPITAL_COMMUNITY)
Admission: RE | Admit: 2020-12-26 | Discharge: 2020-12-26 | Disposition: A | Discharge status: Home / Self Care | Payer: 59 | Attending: General Surgery | Admitting: General Surgery

## 2020-12-26 ENCOUNTER — Ambulatory Visit (HOSPITAL_COMMUNITY): Payer: 59 | Admitting: Anesthesiology

## 2020-12-26 ENCOUNTER — Encounter (HOSPITAL_COMMUNITY)
Admission: RE | Disposition: A | Discharge status: Home / Self Care | Payer: Self-pay | Source: Home / Self Care | Attending: General Surgery

## 2020-12-26 ENCOUNTER — Encounter (HOSPITAL_COMMUNITY): Payer: Self-pay | Admitting: General Surgery

## 2020-12-26 DIAGNOSIS — I10 Essential (primary) hypertension: Secondary | ICD-10-CM | POA: Insufficient documentation

## 2020-12-26 DIAGNOSIS — R590 Localized enlarged lymph nodes: Secondary | ICD-10-CM | POA: Diagnosis not present

## 2020-12-26 DIAGNOSIS — Z85828 Personal history of other malignant neoplasm of skin: Secondary | ICD-10-CM | POA: Diagnosis not present

## 2020-12-26 DIAGNOSIS — C8335 Diffuse large B-cell lymphoma, lymph nodes of inguinal region and lower limb: Secondary | ICD-10-CM | POA: Insufficient documentation

## 2020-12-26 DIAGNOSIS — Z87442 Personal history of urinary calculi: Secondary | ICD-10-CM | POA: Insufficient documentation

## 2020-12-26 HISTORY — PX: INGUINAL LYMPH NODE BIOPSY: SHX5865

## 2020-12-26 SURGERY — BIOPSY, LYMPH NODE, INGUINAL, OPEN
Anesthesia: General | Site: Inguinal | Laterality: Left

## 2020-12-26 MED ORDER — CEFAZOLIN SODIUM-DEXTROSE 2-4 GM/100ML-% IV SOLN
INTRAVENOUS | Status: AC
  Filled 2020-12-26: qty 100

## 2020-12-26 MED ORDER — BUPIVACAINE HCL (PF) 0.5 % IJ SOLN
INTRAMUSCULAR | Status: AC
  Filled 2020-12-26: qty 30

## 2020-12-26 MED ORDER — CHLORHEXIDINE GLUCONATE 0.12 % MT SOLN
15.0000 mL | Freq: Once | OROMUCOSAL | Status: AC
  Administered 2020-12-26: 15 mL via OROMUCOSAL

## 2020-12-26 MED ORDER — 0.9 % SODIUM CHLORIDE (POUR BTL) OPTIME
TOPICAL | Status: DC | PRN
  Administered 2020-12-26: 1000 mL

## 2020-12-26 MED ORDER — LIDOCAINE HCL (CARDIAC) PF 50 MG/5ML IV SOSY
PREFILLED_SYRINGE | INTRAVENOUS | Status: DC | PRN
  Administered 2020-12-26: 80 mg via INTRAVENOUS

## 2020-12-26 MED ORDER — MIDAZOLAM HCL 5 MG/5ML IJ SOLN
INTRAMUSCULAR | Status: DC | PRN
  Administered 2020-12-26: 2 mg via INTRAVENOUS

## 2020-12-26 MED ORDER — BUPIVACAINE HCL (PF) 0.5 % IJ SOLN: INTRAMUSCULAR | Status: DC | PRN

## 2020-12-26 MED ORDER — FENTANYL CITRATE (PF) 100 MCG/2ML IJ SOLN
INTRAMUSCULAR | Status: DC | PRN
  Administered 2020-12-26 (×2): 25 ug via INTRAVENOUS
  Administered 2020-12-26: 50 ug via INTRAVENOUS
  Administered 2020-12-26: 25 ug via INTRAVENOUS

## 2020-12-26 MED ORDER — ONDANSETRON HCL 4 MG PO TABS: 4.0000 mg | ORAL_TABLET | Freq: Three times a day (TID) | ORAL | 0 refills | Status: DC | PRN

## 2020-12-26 MED ORDER — EPHEDRINE 5 MG/ML INJ
INTRAVENOUS | Status: AC
  Filled 2020-12-26: qty 10

## 2020-12-26 MED ORDER — ONDANSETRON HCL 4 MG/2ML IJ SOLN: 4.0000 mg | Freq: Once | INTRAMUSCULAR | Status: DC | PRN

## 2020-12-26 MED ORDER — LACTATED RINGERS IV SOLN: INTRAVENOUS | Status: DC

## 2020-12-26 MED ORDER — KETOROLAC TROMETHAMINE 30 MG/ML IJ SOLN
30.0000 mg | Freq: Once | INTRAMUSCULAR | Status: AC
  Administered 2020-12-26: 30 mg via INTRAVENOUS
  Filled 2020-12-26: qty 1

## 2020-12-26 MED ORDER — FENTANYL CITRATE (PF) 250 MCG/5ML IJ SOLN
INTRAMUSCULAR | Status: AC
  Filled 2020-12-26: qty 5

## 2020-12-26 MED ORDER — CHLORHEXIDINE GLUCONATE CLOTH 2 % EX PADS: 6.0000 | MEDICATED_PAD | Freq: Once | CUTANEOUS | Status: DC

## 2020-12-26 MED ORDER — MIDAZOLAM HCL 2 MG/2ML IJ SOLN
INTRAMUSCULAR | Status: AC
  Filled 2020-12-26: qty 2

## 2020-12-26 MED ORDER — PROPOFOL 10 MG/ML IV BOLUS
INTRAVENOUS | Status: AC
  Filled 2020-12-26: qty 20

## 2020-12-26 MED ORDER — BUPIVACAINE HCL (PF) 0.5 % IJ SOLN
INTRAMUSCULAR | Status: DC | PRN
  Administered 2020-12-26: 10 mL

## 2020-12-26 MED ORDER — MEPERIDINE HCL 50 MG/ML IJ SOLN: 6.2500 mg | INTRAMUSCULAR | Status: DC | PRN

## 2020-12-26 MED ORDER — CEFAZOLIN SODIUM-DEXTROSE 2-4 GM/100ML-% IV SOLN
2.0000 g | INTRAVENOUS | Status: AC
  Administered 2020-12-26: 2 g via INTRAVENOUS

## 2020-12-26 MED ORDER — BUPIVACAINE-MELOXICAM ER 200-6 MG/7ML IJ SOLN
INTRAMUSCULAR | Status: DC | PRN
  Administered 2020-12-26: 2 mL
  Administered 2020-12-26: 1.5 mL

## 2020-12-26 MED ORDER — FENTANYL CITRATE (PF) 100 MCG/2ML IJ SOLN: 25.0000 ug | INTRAMUSCULAR | Status: DC | PRN

## 2020-12-26 MED ORDER — ONDANSETRON HCL 4 MG/2ML IJ SOLN
INTRAMUSCULAR | Status: DC | PRN
  Administered 2020-12-26: 4 mg via INTRAVENOUS

## 2020-12-26 MED ORDER — OXYCODONE HCL 5 MG PO TABS: 5.0000 mg | ORAL_TABLET | ORAL | 0 refills | Status: DC | PRN

## 2020-12-26 MED ORDER — EPHEDRINE SULFATE 50 MG/ML IJ SOLN
INTRAMUSCULAR | Status: DC | PRN
  Administered 2020-12-26: 10 mg via INTRAVENOUS
  Administered 2020-12-26: 5 mg via INTRAVENOUS

## 2020-12-26 MED ORDER — PROPOFOL 10 MG/ML IV BOLUS
INTRAVENOUS | Status: DC | PRN
  Administered 2020-12-26: 200 mg via INTRAVENOUS

## 2020-12-26 MED ORDER — ORAL CARE MOUTH RINSE: 15.0000 mL | Freq: Once | OROMUCOSAL | Status: AC

## 2020-12-26 SURGICAL SUPPLY — 28 items
ADH SKN CLS APL DERMABOND .7 (GAUZE/BANDAGES/DRESSINGS) ×1
CLOTH BEACON ORANGE TIMEOUT ST (SAFETY) ×2 IMPLANT
CNTNR URN SCR LID CUP LEK RST (MISCELLANEOUS) ×1 IMPLANT
CONT SPEC 4OZ STRL OR WHT (MISCELLANEOUS) ×2
COVER LIGHT HANDLE STERIS (MISCELLANEOUS) ×4 IMPLANT
COVER WAND RF STERILE (DRAPES) ×2 IMPLANT
DECANTER SPIKE VIAL GLASS SM (MISCELLANEOUS) ×2 IMPLANT
DERMABOND ADVANCED (GAUZE/BANDAGES/DRESSINGS) ×1
DERMABOND ADVANCED .7 DNX12 (GAUZE/BANDAGES/DRESSINGS) ×1 IMPLANT
ELECT REM PT RETURN 9FT ADLT (ELECTROSURGICAL) ×2
ELECTRODE REM PT RTRN 9FT ADLT (ELECTROSURGICAL) ×1 IMPLANT
GLOVE SURG ENC MOIS LTX SZ6.5 (GLOVE) ×2 IMPLANT
GLOVE SURG UNDER POLY LF SZ6.5 (GLOVE) ×2 IMPLANT
GLOVE SURG UNDER POLY LF SZ7 (GLOVE) ×4 IMPLANT
GOWN STRL REUS W/TWL LRG LVL3 (GOWN DISPOSABLE) ×4 IMPLANT
KIT TURNOVER KIT A (KITS) ×2 IMPLANT
MANIFOLD NEPTUNE II (INSTRUMENTS) ×2 IMPLANT
NDL HYPO 25X1 1.5 SAFETY (NEEDLE) ×1 IMPLANT
NEEDLE HYPO 25X1 1.5 SAFETY (NEEDLE) ×2 IMPLANT
NS IRRIG 1000ML POUR BTL (IV SOLUTION) ×2 IMPLANT
PACK MINOR (CUSTOM PROCEDURE TRAY) ×2 IMPLANT
PAD ARMBOARD 7.5X6 YLW CONV (MISCELLANEOUS) ×2 IMPLANT
SET BASIN LINEN APH (SET/KITS/TRAYS/PACK) ×2 IMPLANT
SHEARS HARMONIC 9CM CVD (BLADE) ×1 IMPLANT
SUT MNCRL AB 4-0 PS2 18 (SUTURE) ×2 IMPLANT
SUT VIC AB 3-0 SH 27 (SUTURE) ×2
SUT VIC AB 3-0 SH 27X BRD (SUTURE) ×1 IMPLANT
SYR CONTROL 10ML LL (SYRINGE) ×2 IMPLANT

## 2020-12-26 NOTE — Anesthesia Postprocedure Evaluation (Addendum)
Anesthesia Post Note  Patient: Steve Smith  Procedure(s) Performed: LEFT INGUINAL EXCISIONAL  LYMPH NODE BIOPSY (Left Inguinal)  Patient location during evaluation: PACU Anesthesia Type: General Level of consciousness: awake and alert and oriented Pain management: pain level controlled Vital Signs Assessment: post-procedure vital signs reviewed and stable Respiratory status: spontaneous breathing Cardiovascular status: blood pressure returned to baseline and stable Postop Assessment: no apparent nausea or vomiting Anesthetic complications: no Comments: Late entry   No complications documented.   Last Vitals:  Vitals:   12/26/20 1130 12/26/20 1145  BP: 128/80 (!) 140/96  Pulse: 69 69  Resp: 12 16  Temp:  36.4 C  SpO2: 93% 97%    Last Pain:  Vitals:   12/26/20 1145  TempSrc: Axillary  PainSc: 3                  Jernard Reiber

## 2020-12-26 NOTE — Anesthesia Preprocedure Evaluation (Signed)
Anesthesia Evaluation  Patient identified by MRN, date of birth, ID band Patient awake    Reviewed: Allergy & Precautions, NPO status , Patient's Chart, lab work & pertinent test results  History of Anesthesia Complications Negative for: history of anesthetic complications  Airway Mallampati: II  TM Distance: >3 FB Neck ROM: Full    Dental  (+) Dental Advisory Given, Teeth Intact   Pulmonary neg pulmonary ROS,    Pulmonary exam normal breath sounds clear to auscultation       Cardiovascular Exercise Tolerance: Good hypertension, Pt. on medications Normal cardiovascular exam Rhythm:Regular Rate:Normal     Neuro/Psych negative neurological ROS  negative psych ROS   GI/Hepatic negative GI ROS, Neg liver ROS,   Endo/Other  negative endocrine ROS  Renal/GU Renal disease (stone)     Musculoskeletal negative musculoskeletal ROS (+)   Abdominal   Peds  Hematology negative hematology ROS (+)   Anesthesia Other Findings Basal cell carcinoma   Reproductive/Obstetrics negative OB ROS                             Anesthesia Physical Anesthesia Plan  ASA: II  Anesthesia Plan: General   Post-op Pain Management:    Induction: Intravenous  PONV Risk Score and Plan: 3 and Ondansetron  Airway Management Planned: LMA  Additional Equipment:   Intra-op Plan:   Post-operative Plan: Extubation in OR  Informed Consent: I have reviewed the patients History and Physical, chart, labs and discussed the procedure including the risks, benefits and alternatives for the proposed anesthesia with the patient or authorized representative who has indicated his/her understanding and acceptance.     Dental advisory given  Plan Discussed with: CRNA and Surgeon  Anesthesia Plan Comments:         Anesthesia Quick Evaluation

## 2020-12-26 NOTE — Transfer of Care (Signed)
Immediate Anesthesia Transfer of Care Note  Patient: Steve Smith  Procedure(s) Performed: LEFT INGUINAL EXCISIONAL  LYMPH NODE BIOPSY (Left Inguinal)  Patient Location: PACU  Anesthesia Type:General  Level of Consciousness: awake  Airway & Oxygen Therapy: Patient Spontanous Breathing  Post-op Assessment: Report given to RN  Post vital signs: Reviewed and stable  Last Vitals:  Vitals Value Taken Time  BP 152/84 12/26/20 1058  Temp 36.6 C 12/26/20 1100  Pulse 89 12/26/20 1100  Resp 18 12/26/20 1100  SpO2 96 % 12/26/20 1100  Vitals shown include unvalidated device data.  Last Pain:  Vitals:   12/26/20 0747  TempSrc: Oral  PainSc: 0-No pain      Patients Stated Pain Goal: 5 (93/40/68 [id number])  Complications: No complications documented.

## 2020-12-26 NOTE — Anesthesia Procedure Notes (Signed)
Procedure Name: LMA Insertion Date/Time: 12/26/2020 9:35 AM Performed by: Ollen Bowl, CRNA Pre-anesthesia Checklist: Patient identified, Patient being monitored, Emergency Drugs available, Timeout performed and Suction available Patient Re-evaluated:Patient Re-evaluated prior to induction Oxygen Delivery Method: Circle System Utilized Preoxygenation: Pre-oxygenation with 100% oxygen Induction Type: IV induction Ventilation: Mask ventilation without difficulty LMA: LMA inserted LMA Size: 5.0 Number of attempts: 1 Placement Confirmation: positive ETCO2 and breath sounds checked- equal and bilateral

## 2020-12-26 NOTE — Discharge Instructions (Signed)
Discharge Instructions:  Common Complaints: Pain at the incision site is common.  Some nausea is common and poor appetite after anesthesia. The main goal is to stay hydrated the first few days after surgery.   Diet/ Activity: Diet as tolerated. You may not have an appetite, but it is important to stay hydrated. Drink 64 ounces of water a day. Your appetite will return with time.  Shower per your regular routine daily.  Do not take hot showers. Take warm showers that are less than 10 minutes. Do not submerge for at least 3- 4 weeks.  Walk everyday for at least 15-20 minutes. Deep cough and move around every 1-2 hours in the first few days after surgery.  Limit excessive movement, lifting > 10 lbs, stretching with the limb if there is an incision on your arm/armpit or leg.   Limit stretching, pulling on your incision if it is located on other parts of your body.  Do not pick at the dermabond glue on your incision sites. This glue film will remain in place for 1-2 weeks and will start to peel off.  Do not place lotions or balms on your incision unless instructed to specifically by Dr. Constance Haw.   Medication: Take tylenol and ibuprofen as needed for pain control, alternating every 4-6 hours.  Example:  Tylenol 1000mg  @ 6am, 12noon, 6pm, 54midnight (Do not exceed 4000mg  of tylenol a day). Ibuprofen 800mg  @ 9am, 3pm, 9pm, 3am (Do not exceed 3600mg  of ibuprofen a day).  Take Roxicodone for breakthrough pain every 4 hours.  Take Colace for constipation related to narcotic pain medication. If you do not have a bowel movement in 2 days, take Miralax over the counter.  Drink plenty of water to also prevent constipation.   Contact Information: If you have questions or concerns, please call our office, [phone], Monday- Thursday 8AM-5PM and Friday 8AM-12Noon.  If it is after hours or on the weekend, please call [facility], [phone], and ask to speak to the surgeon on call for Dr. Constance Haw  at [facility]    Open Lymph Node Biopsy, Care After The following information offers guidance on how to care for yourself after your procedure. Your health care provider may also give you more specific instructions. If you have problems or questions, contact your health care provider. What can I expect after the procedure? After the procedure, it is common to have:  Bruising.  Soreness.  Mild swelling. Follow these instructions at home: Medicines  Take over-the-counter and prescription medicines only as told by your health care provider.  If you were prescribed an antibiotic medicine, take or use it as told by your health care provider. Do not stop taking the antibiotic even if you start to feel better.  Do not drive or use heavy machinery while taking prescription pain medicine. Incision care  Follow instructions from your health care provider about how to take care of your incision. Make sure you: ? Wash your hands with soap and water for at least 20 seconds before and after you change your bandage (dressing). If soap and water are not available, use hand sanitizer. ? Change your dressing as told by your health care provider. ? Leave stitches (sutures), skin glue, or adhesive strips in place. These skin closures may need to stay in place for 2 weeks or longer. If adhesive strip edges start to loosen and curl up, you may trim the loose edges. Do not remove adhesive strips completely unless your health care provider  tells you to do that.  Check your incision area every day for signs of infection. Check for: ? More redness, swelling, or pain. ? Fluid or blood. ? Warmth. ? Pus or a bad smell.   General instructions  Do not take baths, swim, or use a hot tub until your health care provider approves. Ask your health care provider if you may take showers. You may only be allowed to take sponge baths.  If you were given a sedative during the procedure, it can affect you for several  hours. Do not drive or operate machinery until your health care provider says that it is safe.  Return to your normal activities as told by your health care provider. Ask your health care provider what activities are safe for you.  Keep all follow-up visits. This is important. Contact a health care provider if:  You have a fever.  You have increased redness, swelling, or pain around your incision.  You have fluid or blood coming from your incision.  Your incision feels warm to the touch.  You have pus or a bad smell coming from your incision.  You have pain or numbness that gets worse or lasts longer than a few days. Summary  After a lymph node biopsy, it is common to have bruising, soreness, and mild swelling.  Follow your health care provider's instructions about taking care of yourself at home. You will be told how to take medicines, take care of your incision, and check for infection.  Return to your normal activities as told by your health care provider. Ask your health care provider what activities are safe for you.  Contact a health care provider if you have increased redness, swelling, or pain around your incision, you have a fever, or you have worsening pain or numbness. This information is not intended to replace advice given to you by your health care provider. Make sure you discuss any questions you have with your health care provider. Document Revised: 06/28/2020 Document Reviewed: 06/28/2020 Elsevier Patient Education  2021 [place] Anesthesia, Adult, Care After This sheet gives you information about how to care for yourself after your procedure. Your health care provider may also give you more specific instructions. If you have problems or questions, contact your health care provider. What can I expect after the procedure? After the procedure, the following side effects are common:  Pain or discomfort at the IV  site.  Nausea.  Vomiting.  Sore throat.  Trouble concentrating.  Feeling cold or chills.  Feeling weak or tired.  Sleepiness and fatigue.  Soreness and body aches. These side effects can affect parts of the body that were not involved in surgery. Follow these instructions at home: For the time period you were told by your health care provider:  Rest.  Do not participate in activities where you could fall or become injured.  Do not drive or use machinery.  Do not drink alcohol.  Do not take sleeping pills or medicines that cause drowsiness.  Do not make important decisions or sign legal documents.  Do not take care of children on your own.   Eating and drinking  Follow any instructions from your health care provider about eating or drinking restrictions.  When you feel hungry, start by eating small amounts of foods that are soft and easy to digest (bland), such as toast. Gradually return to your regular diet.  Drink enough fluid to keep your urine pale yellow.  If you vomit, rehydrate by drinking water, juice, or clear broth. General instructions  If you have sleep apnea, surgery and certain medicines can increase your risk for breathing problems. Follow instructions from your health care provider about wearing your sleep device: ? Anytime you are sleeping, including during daytime naps. ? While taking prescription pain medicines, sleeping medicines, or medicines that make you drowsy.  Have a responsible adult stay with you for the time you are told. It is important to have someone help care for you until you are awake and alert.  Return to your normal activities as told by your health care provider. Ask your health care provider what activities are safe for you.  Take over-the-counter and prescription medicines only as told by your health care provider.  If you smoke, do not smoke without supervision.  Keep all follow-up visits as told by your health care  provider. This is important. Contact a health care provider if:  You have nausea or vomiting that does not get better with medicine.  You cannot eat or drink without vomiting.  You have pain that does not get better with medicine.  You are unable to pass urine.  You develop a skin rash.  You have a fever.  You have redness around your IV site that gets worse. Get help right away if:  You have difficulty breathing.  You have chest pain.  You have blood in your urine or stool, or you vomit blood. Summary  After the procedure, it is common to have a sore throat or nausea. It is also common to feel tired.  Have a responsible adult stay with you for the time you are told. It is important to have someone help care for you until you are awake and alert.  When you feel hungry, start by eating small amounts of foods that are soft and easy to digest (bland), such as toast. Gradually return to your regular diet.  Drink enough fluid to keep your urine pale yellow.  Return to your normal activities as told by your health care provider. Ask your health care provider what activities are safe for you. This information is not intended to replace advice given to you by your health care provider. Make sure you discuss any questions you have with your health care provider. Document Revised: 05/31/2020 Document Reviewed: 12/29/2019 Elsevier Patient Education  2021 [place] UNTIL Saturday December 29, 2020. DO NOT USE ADDITIONAL NUMBING MEDICATIONS WITHOUT CONSULTING A PHYSICIAN UNTIL AFTER SATURDAY

## 2020-12-26 NOTE — Op Note (Signed)
[facility] Operative Note  12/26/20  Preoperative Diagnosis:  Left inguinal adenopathy    Postoperative Diagnosis: Same   Procedure(s) Performed:  Excision lymph node biopsy, left inguinal (5cm)   Surgeon: Lanell Matar. Constance Haw, MD   Assistants: No qualified resident was available    Anesthesia: General endotracheal   Anesthesiologist: Denese Killings, MD    Specimens: Left inguinal node    Estimated Blood Loss: Minimal   Blood Replacement: None    Complications: None   Wound Class: Clean    Operative Indications:  Mr. Pettengill is a 60 yo with adenopathy in the groin on the left side. We discussed excisional biopsy and the risk of bleeding, infection, seroma, lymph leak, failure to diagnose with this specimen, and he opted to proceed.   Findings: 5cm left inguinal node inferior to the inguinal ligament    Procedure: The patient was taken to the operating room and placed supine. General endotracheal anesthesia was induced. Intravenous antibiotics were administered per protocol.  The left groin was prepared and draped in the usual sterile fashion.   An incision was made over the enlarged node which was on the mons area just below the inguinal ligament area.  This was carried down through the subcutaneous tissue to the lymph node and then then large 5cm + lymph node was excised using care and a Harmonic scalpel device. Care was taken to excise the node which was inferior to the inguinal ligament. Once the node was removed it was sent as a fresh specimen to pathology.    The cavity was made hemostatic. Marcaine 10cc was injected into the cavity and Zynrelef was placed into the cavity using the applicator.  The deep space was closed with 3-0 Vicryl interrupted, and additional Zynrelef was placed in the wound.  The dermis was closed with 3-0 Vicryl interrupted and the skin was closed with 4-0 Monocryl subcuticular and dermabond.   Final inspection revealed  acceptable hemostasis. All counts were correct at the end of the case. The patient was awakened from anesthesia and extubated without complication.  The patient went to the PACU in stable condition.   Curlene Labrum, MD [facility] [address] [place], [place] [address] [phone] (office)

## 2020-12-26 NOTE — Interval H&P Note (Signed)
History and Physical Interval Note:  12/26/2020 8:36 AM  Steve Smith  has presented today for surgery, with the diagnosis of Lymphadenopathy.  The various methods of treatment have been discussed with the patient and family. After consideration of risks, benefits and other options for treatment, the patient has consented to  Procedure(s): INGUINAL LYMPH NODE BIOPSY (Left) as a surgical intervention.  The patient's history has been reviewed, patient examined, no change in status, stable for surgery.  I have reviewed the patient's chart and labs.  Questions were answered to the patient's satisfaction.    Marked.   Virl Cagey

## 2020-12-26 NOTE — Progress Notes (Signed)
[facility]  Notified wife surgery completed. Lymph node sent. Will call with results. Will check on him in the 2 weeks.  Curlene Labrum, MD [facility] [address] [place], [place] [address] [phone] (office)

## 2020-12-27 ENCOUNTER — Encounter (HOSPITAL_COMMUNITY): Payer: Self-pay | Admitting: General Surgery

## 2020-12-31 ENCOUNTER — Telehealth (INDEPENDENT_AMBULATORY_CARE_PROVIDER_SITE_OTHER): Payer: 59 | Admitting: General Surgery

## 2020-12-31 ENCOUNTER — Telehealth: Payer: Self-pay | Admitting: Hematology and Oncology

## 2020-12-31 DIAGNOSIS — R590 Localized enlarged lymph nodes: Secondary | ICD-10-CM

## 2020-12-31 LAB — SURGICAL PATHOLOGY

## 2020-12-31 NOTE — Telephone Encounter (Signed)
[facility]  Notify patient of the pathology results.   FINAL MICROSCOPIC DIAGNOSIS:   A. LYMPH NODE, INGUINAL LEFT, BIOPSY:  - Large B-cell lymphoma, see comment  He reports his incision is doing well.   Can place port as soon as possible. Office will get the patient the Oncology appt at [facility].   Curlene Labrum, MD [facility] [address] [place], [place] [address] [phone] (office)

## 2020-12-31 NOTE — Telephone Encounter (Signed)
Received a new pt referral from Dr. Constance Haw for dx of large b-cell lymphoma. I received a call from [place] at [facility] to schedule an appt. Steve Smith has been scheduled to see Dr. Lorenso Courier on 4/8 at [place] will notify the pt of the appt date and time and for him to arrive 20 minutes early.

## 2021-01-03 ENCOUNTER — Other Ambulatory Visit: Payer: Self-pay

## 2021-01-03 ENCOUNTER — Ambulatory Visit (INDEPENDENT_AMBULATORY_CARE_PROVIDER_SITE_OTHER): Payer: 59 | Admitting: General Surgery

## 2021-01-03 ENCOUNTER — Encounter: Payer: Self-pay | Admitting: General Surgery

## 2021-01-03 VITALS — BP 117/81 | HR 82 | Temp 98.2°F | Resp 14 | Ht 75.0 in | Wt 264.0 lb

## 2021-01-03 DIAGNOSIS — T8131XA Disruption of external operation (surgical) wound, not elsewhere classified, initial encounter: Secondary | ICD-10-CM | POA: Insufficient documentation

## 2021-01-03 DIAGNOSIS — R590 Localized enlarged lymph nodes: Secondary | ICD-10-CM

## 2021-01-03 MED ORDER — SULFAMETHOXAZOLE-TRIMETHOPRIM 800-160 MG PO TABS: 1.0000 | ORAL_TABLET | Freq: Two times a day (BID) | ORAL | 0 refills | Status: AC

## 2021-01-03 NOTE — Patient Instructions (Signed)
Keep area dry. Take antibiotic. Let us know if it opens up or drains.

## 2021-01-04 ENCOUNTER — Inpatient Hospital Stay: Payer: 59 | Attending: Hematology and Oncology | Admitting: Hematology and Oncology

## 2021-01-04 ENCOUNTER — Inpatient Hospital Stay: Payer: 59

## 2021-01-04 VITALS — BP 136/93 | HR 80 | Temp 98.0°F | Ht 75.0 in | Wt 266.5 lb

## 2021-01-04 DIAGNOSIS — C8338 Diffuse large B-cell lymphoma, lymph nodes of multiple sites: Secondary | ICD-10-CM | POA: Diagnosis present

## 2021-01-04 DIAGNOSIS — Z79899 Other long term (current) drug therapy: Secondary | ICD-10-CM | POA: Diagnosis not present

## 2021-01-04 DIAGNOSIS — C8335 Diffuse large B-cell lymphoma, lymph nodes of inguinal region and lower limb: Secondary | ICD-10-CM | POA: Diagnosis not present

## 2021-01-04 DIAGNOSIS — I10 Essential (primary) hypertension: Secondary | ICD-10-CM | POA: Insufficient documentation

## 2021-01-04 DIAGNOSIS — Z5189 Encounter for other specified aftercare: Secondary | ICD-10-CM | POA: Diagnosis not present

## 2021-01-04 DIAGNOSIS — Z5111 Encounter for antineoplastic chemotherapy: Secondary | ICD-10-CM | POA: Insufficient documentation

## 2021-01-04 DIAGNOSIS — Z5112 Encounter for antineoplastic immunotherapy: Secondary | ICD-10-CM | POA: Diagnosis not present

## 2021-01-04 DIAGNOSIS — Z7952 Long term (current) use of systemic steroids: Secondary | ICD-10-CM | POA: Diagnosis not present

## 2021-01-04 LAB — CMP (CANCER CENTER ONLY)
ALT: 19 U/L (ref 0–44)
AST: 18 U/L (ref 15–41)
Albumin: 4.3 g/dL (ref 3.5–5.0)
Alkaline Phosphatase: 103 U/L (ref 38–126)
Anion gap: 15 (ref 5–15)
BUN: 19 mg/dL (ref 6–20)
CO2: 22 mmol/L (ref 22–32)
Calcium: 9.6 mg/dL (ref 8.9–10.3)
Chloride: 104 mmol/L (ref 98–111)
Creatinine: 1.47 mg/dL — ABNORMAL HIGH (ref 0.61–1.24)
GFR, Estimated: 55 mL/min — ABNORMAL LOW (ref >60)
Glucose, Bld: 114 mg/dL — ABNORMAL HIGH (ref 70–99)
Potassium: 4.1 mmol/L (ref 3.5–5.1)
Sodium: 141 mmol/L (ref 135–145)
Total Bilirubin: 0.8 mg/dL (ref 0.3–1.2)
Total Protein: 7.9 g/dL (ref 6.5–8.1)

## 2021-01-04 LAB — HEPATITIS B SURFACE ANTIBODY,QUALITATIVE: Hep B S Ab: NONREACTIVE

## 2021-01-04 LAB — CBC WITH DIFFERENTIAL (CANCER CENTER ONLY)
Abs Immature Granulocytes: 0.01 10*3/uL (ref 0.00–0.07)
Basophils Absolute: 0 10*3/uL (ref 0.0–0.1)
Basophils Relative: 1 %
Eosinophils Absolute: 0.4 10*3/uL (ref 0.0–0.5)
Eosinophils Relative: 7 %
HCT: 49.4 % (ref 39.0–52.0)
Hemoglobin: 16.9 g/dL (ref 13.0–17.0)
Immature Granulocytes: 0 %
Lymphocytes Relative: 24 %
Lymphs Abs: 1.5 10*3/uL (ref 0.7–4.0)
MCH: 30.1 pg (ref 26.0–34.0)
MCHC: 34.2 g/dL (ref 30.0–36.0)
MCV: 88.1 fL (ref 80.0–100.0)
Monocytes Absolute: 0.4 10*3/uL (ref 0.1–1.0)
Monocytes Relative: 6 %
Neutro Abs: 3.9 10*3/uL (ref 1.7–7.7)
Neutrophils Relative %: 62 %
Platelet Count: 239 10*3/uL (ref 150–400)
RBC: 5.61 MIL/uL (ref 4.22–5.81)
RDW: 12.9 % (ref 11.5–15.5)
WBC Count: 6.3 10*3/uL (ref 4.0–10.5)
nRBC: 0 % (ref 0.0–0.2)

## 2021-01-04 LAB — URIC ACID: Uric Acid, Serum: 6.8 mg/dL (ref 3.7–8.6)

## 2021-01-04 LAB — HEPATITIS B SURFACE ANTIGEN: Hepatitis B Surface Ag: NONREACTIVE

## 2021-01-04 LAB — HEPATITIS C ANTIBODY: HCV Ab: NONREACTIVE

## 2021-01-04 LAB — HIV ANTIBODY (ROUTINE TESTING W REFLEX): HIV Screen 4th Generation wRfx: NONREACTIVE

## 2021-01-04 LAB — HEPATITIS B CORE ANTIBODY, TOTAL: Hep B Core Total Ab: NONREACTIVE

## 2021-01-04 LAB — LACTATE DEHYDROGENASE: LDH: 212 U/L — ABNORMAL HIGH (ref 98–192)

## 2021-01-04 NOTE — Progress Notes (Signed)
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  01/04/2021: establish care with Dr. Lorenso Courier   CHIEF COMPLAINTS/PURPOSE OF CONSULTATION:  "Diffuse Large B Cell Lymphoma "  HISTORY OF PRESENTING ILLNESS:  Steve Smith 60 y.o. male with medical history significant for HTN and basal cell carcinoma who presents for evaluation of a new diagnosis of DLBCL.   On review of the previous records Mr. Steve Smith developed a lump in his groin in January 2022.  Reportedly underwent a CT scan which showed a 3 cm lymph node and splenic changes concerning for lymphoma.  He was referred to surgery for consideration of a lymph node excision.  The lymph node was excised on 12/26/2020 and the pathology confirmed a diffuse large B-cell lymphoma.  The patient was referred to oncology for further evaluation and management.  On exam today Steve Smith is accompanied by his wife.  He reports that his symptoms began in October 2021 at which time he began having some pain/burning in his left groin area.  They became an area of swelling which progressively got bigger.  He notes that he did not have any symptoms of fevers, chills, sweats and did not notice any bumps or lumps elsewhere on his body.  He notes that he did not have any swelling in his lower extremities and no changes in his skin.  On further discussion he notes that he is a never smoker and does not drink.  He notes that his mother has a history of basal cell cancer and his father unfortunately passed away of bile duct cancer.  He notes he has 2  children who are currently in good health.  He notes that his appetite has been good and he has not suffered from any weight loss.  A full 10 point ROS is listed below.  MEDICAL HISTORY:  Past Medical History:  Diagnosis Date  . Basal cell carcinoma   . Hypertension   . Kidney stone     SURGICAL HISTORY: Past Surgical History:  Procedure Laterality Date  . INGUINAL LYMPH NODE BIOPSY Left 12/26/2020   Procedure: LEFT INGUINAL EXCISIONAL  LYMPH NODE BIOPSY;  Surgeon: Virl Cagey, MD;  Location: [facility];  Service: General;  Laterality: Left;  . ROTATOR CUFF REPAIR     Left    SOCIAL HISTORY: Social History   Socioeconomic History  . Marital status: Married    Spouse name: Not on file  . Number of children: Not on file  . Years of education: Not on file  . Highest education level: Not on file  Occupational History  . Not on file  Tobacco Use  . Smoking status: Never Smoker  . Smokeless tobacco: Never Used  Substance and Sexual Activity  . Alcohol use: No  . Drug use: No  . Sexual activity: Not on file  Other Topics Concern  . Not on file  Social History Narrative  . Not on file   Social Determinants of Health   Financial Resource Strain: Not on file  Food Insecurity: Not on file  Transportation Needs: Not on file  Physical Activity: Not on file  Stress: Not  on file  Social Connections: Not on file  Intimate Partner Violence: Not on file    FAMILY HISTORY: No family history on file.  ALLERGIES:  has No Known Allergies.  MEDICATIONS:  Current Outpatient Medications  Medication Sig Dispense Refill  . allopurinol (ZYLOPRIM) 300 MG tablet Take 1 tablet (300 mg total) by mouth daily. 90 tablet 1  . hydrochlorothiazide (HYDRODIURIL) 25 MG tablet Take 25 mg by mouth daily.    Marland Kitchen lidocaine-prilocaine (EMLA) cream Apply 1 application topically as needed. 30 g 0  . prochlorperazine (COMPAZINE) 10 MG tablet Take 1 tablet (10 mg total) by mouth every 6 (six) hours  as needed for nausea or vomiting. 30 tablet 0  . ibuprofen (ADVIL) 200 MG tablet Take 400-600 mg by mouth every 6 (six) hours as needed for headache or moderate pain.    Marland Kitchen olmesartan (BENICAR) 20 MG tablet Take 20 mg by mouth daily.    . ondansetron (ZOFRAN) 4 MG tablet Take 1 tablet (4 mg total) by mouth every 8 (eight) hours as needed. 30 tablet 0  . oxyCODONE (ROXICODONE) 5 MG immediate release tablet Take 1 tablet (5 mg total) by mouth every 4 (four) hours as needed for severe pain or breakthrough pain. 5 tablet 0  . sulfamethoxazole-trimethoprim (BACTRIM DS) 800-160 MG tablet Take 1 tablet by mouth 2 (two) times daily for 7 days. 14 tablet 0   No current facility-administered medications for this visit.    REVIEW OF SYSTEMS:   Constitutional: ( - ) fevers, ( - )  chills , ( - ) night sweats Eyes: ( - ) blurriness of vision, ( - ) double vision, ( - ) watery eyes Ears, nose, mouth, throat, and face: ( - ) mucositis, ( - ) sore throat Respiratory: ( - ) cough, ( - ) dyspnea, ( - ) wheezes Cardiovascular: ( - ) palpitation, ( - ) chest discomfort, ( - ) lower extremity swelling Gastrointestinal:  ( - ) nausea, ( - ) heartburn, ( - ) change in bowel habits Skin: ( - ) abnormal skin rashes Lymphatics: ( - ) new lymphadenopathy, ( - ) easy bruising Neurological: ( - ) numbness, ( - ) tingling, ( - ) new weaknesses Behavioral/Psych: ( - ) mood change, ( - ) new changes  All other systems were reviewed with the patient and are negative.  PHYSICAL EXAMINATION: ECOG PERFORMANCE STATUS: 0 - Asymptomatic  Vitals:   01/04/21 0902  BP: (!) 136/93  Pulse: 80  Temp: 98 F (36.7 C)  SpO2: 98%   Filed Weights   01/04/21 0902  Weight: 266 lb 8 oz (120.9 kg)    GENERAL: well appearing middle aged Caucasian male in NAD  SKIN: skin color, texture, turgor are normal, no rashes or significant lesions EYES: conjunctiva are pink and non-injected, sclera clear LUNGS: clear to auscultation and  percussion with normal breathing effort HEART: regular rate & rhythm and no murmurs and no lower extremity edema Musculoskeletal: no cyanosis of digits and no clubbing  PSYCH: alert & oriented x 3, fluent speech NEURO: no focal motor/sensory deficits  LABORATORY DATA:  I have reviewed the data as listed CBC Latest Ref Rng & Units 01/04/2021 12/25/2020 02/13/2016  WBC 4.0 - 10.5 K/uL 6.3 5.2 7.7  Hemoglobin 13.0 - 17.0 g/dL 16.9 15.6 14.8  Hematocrit 39.0 - 52.0 % 49.4 46.6 43.9  Platelets 150 - 400 K/uL 239 210 209    CMP Latest Ref Rng & Units 01/04/2021 12/25/2020 02/13/2016  Glucose 70 - 99 mg/dL 114(H) 97 149(H)  BUN 6 - 20 mg/dL 19 16 24(H)  Creatinine 0.61 - 1.24 mg/dL 1.47(H) 1.11 1.33(H)  Sodium 135 - 145 mmol/L 141 140 138  Potassium 3.5 - 5.1 mmol/L 4.1 3.9 3.8  Chloride 98 - 111 mmol/L 104 108 105  CO2 22 - 32 mmol/L 22 22 24   Calcium 8.9 - 10.3 mg/dL 9.6 9.3 9.0  Total Protein 6.5 - 8.1 g/dL 7.9 - -  Total Bilirubin 0.3 - 1.2 mg/dL 0.8 - -  Alkaline Phos 38 - 126 U/L 103 - -  AST 15 - 41 U/L 18 - -  ALT 0 - 44 U/L 19 - -    RADIOGRAPHIC STUDIES: I have personally reviewed the radiological images as listed and agreed with the findings in the report. No results found.  ASSESSMENT & PLAN Steve Smith 60 y.o. male with medical history significant for HTN and basal cell carcinoma who presents for evaluation of a new diagnosis of DLBCL.   After review the labs, the records, schedule the patient the findings most consistent with a diffuse large B-cell lymphoma with staging in process.  We will complete the staging with a PET CT scan in order to assure there are no other sites of lymphadenopathy and active disease.  At this time his disease does appear as early stage and if that is the case after the PET CT scan we will plan for at least 3 cycles of R-CHOP followed by repeat scans and consideration for an additional round of R-CHOP versus radiation therapy.  In the event he is  found to have stage III or IV disease would recommend proceeding with 6 cycles of R-CHOP chemotherapy.  Today we discussed the risks and benefits of R-CHOP chemotherapy.  We noted the nature of diffuse large B-cell lymphoma and the possibility of achieving a complete remission with this chemotherapy regimen.  I also noted the side effects which include but are not limited to nausea, vomiting, hair loss, diarrhea, sedation, insomnia, hyperglycemia, high blood pressure, and cardiotoxicity.  The patient voices understanding of the risk and benefits and was agreeable to proceeding with treatment at this time.  The R-CHOP regimen consists of rituximab 375 mg per metered squared IV on day 1, cyclophosphamide 750 mg per metered squared on day 1, doxorubicin 50 mg/m on day 1, vincristine 1.4 mg per metered squared with a max dose of 2 mg on day 1, and prednisone 100 mg orally days 1 through 5.  A cycle lasts for 21 days and number of cycles planned will depend on the stage as determined by the PET CT scan.  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 --We will prepare the patient for R-CHOP chemotherapy.  Duration will be determined by the extent of disease --We will obtain an echocardiogram in order to assure good baseline cardiac function --Baseline labs to include CBC, CMP, hepatitis B, hepatitis C, HIV, and LDH. --Plan for a tentative start date of cycle 1 day 1 to be on 01/21/2021 --Have the patient return to clinic for cycle 1 day 1 on 01/21/2021  #Supportive Care --chemotherapy education to be scheduled  --zofran 8mg  q8H PRN and compazine 10mg  PO q6H for nausea --port placement to be scheduled. -- allopurinol 300mg  PO daily for TLS prophylaxis -- EMLA cream for port --  no pain medication required at this time.    Orders Placed This  Encounter  Procedures  . NM PET Image Initial (PI) Skull Base To Thigh    Standing Status:   Future    Standing Expiration Date:   01/04/2022    Order Specific Question:   If indicated for the ordered procedure, I authorize the administration of a radiopharmaceutical per Radiology protocol    Answer:   Yes    Order Specific Question:   Preferred imaging location?    Answer:   [facility]  . IR Perc Tun Perit Cath W/Port    Standing Status:   Future    Standing Expiration Date:   01/04/2022    Order Specific Question:   Reason for exam:    Answer:   DLBCL, required for chemotherapy    Order Specific Question:   Preferred Imaging Location?    Answer:   [facility]  . CBC with Differential ([place] Only)    Standing Status:   Future    Number of Occurrences:   1    Standing Expiration Date:   01/04/2022  . CMP ([place] only)    Standing Status:   Future    Number of Occurrences:   1    Standing Expiration Date:   01/04/2022  . Lactate dehydrogenase (LDH)    Standing Status:   Future    Number of Occurrences:   1    Standing Expiration Date:   01/04/2022  . Hepatitis B surface antigen    Standing Status:   Future    Number of Occurrences:   1    Standing Expiration Date:   01/04/2022  . Hepatitis B surface antibody    Standing Status:   Future    Number of Occurrences:   1    Standing Expiration Date:   01/04/2022  . Hepatitis B core antibody, total    Standing Status:   Future    Number of Occurrences:   1    Standing Expiration Date:   01/04/2022  . Hepatitis C antibody    Standing Status:   Future    Number of Occurrences:   1    Standing Expiration Date:   01/04/2022  . HIV antibody (with reflex)    Standing Status:   Future    Number of Occurrences:   1    Standing Expiration Date:   01/04/2022  . Uric acid    Standing Status:   Future    Number of Occurrences:   1    Standing Expiration Date:   01/04/2022  . ECHOCARDIOGRAM COMPLETE    Standing Status:   Future     Standing Expiration Date:   01/04/2022    Scheduling Instructions:     Please scheduled as soon as possible, this is required prior to start of chemotherapy.    Order Specific Question:   Where should this test be performed    Answer:   [facility]    Order Specific Question:   Perflutren DEFINITY (image enhancing agent) should be administered unless hypersensitivity or allergy exist    Answer:   Administer Perflutren    Order Specific Question:   Is a special reader required? (athlete or structural heart)    Answer:   No    Order Specific Question:   Does this study need to be read by the Structural team/Level 3 readers?    Answer:   No    Order  Specific Question:   Reason for exam-Echo    Answer:   Chemo  Z09    All questions were answered. The patient knows to call the clinic with any problems, questions or concerns.  A total of more than 60 minutes were spent on this encounter and over half of that time was spent on counseling and coordination of care as outlined above.   Ledell Peoples, MD Department of Hematology/Oncology [place] [facility] Phone: [phone] Pager: [phone] Email: [email or web address]  01/08/2021 11:04 AM

## 2021-01-04 NOTE — Progress Notes (Signed)
[facility] Note   HPI:  60 y.o. Male presents to clinic for post-op follow-up evaluation of his left groin incision. Patient reports it as a little warm this Am and it worried him. No drainage.  Review of Systems:  No fevers No drainage Incision warm All other review of systems: otherwise negative   Vital Signs:  BP 117/81   Pulse 82   Temp 98.2 F (36.8 C) (Other (Comment))   Resp 14   Ht 6\' 3"  (1.905 m)   Wt 264 lb (119.7 kg)   SpO2 96%   BMI 33.00 kg/m    Physical Exam:  Physical Exam Vitals reviewed.  Cardiovascular:     Rate and Rhythm: Normal rate.  Pulmonary:     Effort: Pulmonary effort is normal.  Abdominal:     General: There is no distension.     Palpations: Abdomen is soft.     Tenderness: There is no abdominal tenderness.     Comments: Left groin incision lateral edge with some erythema and area of fibrinous tissue, possible area of pending dehiscence  Neurological:     Mental Status: He is alert.     Assessment:  60 y.o. yo Male with left groin incision after biopsy for lymphadenopathy. Lymphoma on pathology. Will treat like a local infection given pain and warmth.   Plan:  Keep area dry. Take bactrim for infection  Let us know if it opens up or drains.  Will check on next week  Happy to place port in upcoming weeks as needed   Future Appointments  Date Time Provider [place]  01/08/2021 10:00 AM Virl Cagey, MD [facility] None  01/09/2021  1:00 PM [facility] PET CT 1 [facility] [place]  01/17/2021  2:30 PM [facility] 1 [facility] [place], MD [facility] [address] [place], [place] [address] [phone] (office)

## 2021-01-07 ENCOUNTER — Telehealth: Payer: Self-pay | Admitting: *Deleted

## 2021-01-07 NOTE — Telephone Encounter (Signed)
Received call from pt's wife regarding upcoming schedule. Reviewed it with her and advised that her husband's cardiac echo has been schedule. We are just waiting on Patient education appt.  We are hoping to start chemo around the 25th of April. Advised that Dr. Lorenso Courier will call with results of upcoming PET scan.  Wife voiced understanding

## 2021-01-08 ENCOUNTER — Telehealth: Payer: Self-pay | Admitting: Hematology and Oncology

## 2021-01-08 ENCOUNTER — Ambulatory Visit (INDEPENDENT_AMBULATORY_CARE_PROVIDER_SITE_OTHER): Payer: 59 | Admitting: General Surgery

## 2021-01-08 ENCOUNTER — Encounter: Payer: Self-pay | Admitting: General Surgery

## 2021-01-08 ENCOUNTER — Encounter: Payer: Self-pay | Admitting: Hematology and Oncology

## 2021-01-08 ENCOUNTER — Other Ambulatory Visit: Payer: Self-pay | Admitting: Hematology and Oncology

## 2021-01-08 ENCOUNTER — Other Ambulatory Visit: Payer: Self-pay

## 2021-01-08 VITALS — BP 117/76 | HR 77 | Temp 97.3°F | Resp 14 | Ht 75.0 in | Wt 267.0 lb

## 2021-01-08 DIAGNOSIS — C8330 Diffuse large B-cell lymphoma, unspecified site: Secondary | ICD-10-CM | POA: Insufficient documentation

## 2021-01-08 DIAGNOSIS — C8335 Diffuse large B-cell lymphoma, lymph nodes of inguinal region and lower limb: Secondary | ICD-10-CM

## 2021-01-08 DIAGNOSIS — T8131XA Disruption of external operation (surgical) wound, not elsewhere classified, initial encounter: Secondary | ICD-10-CM

## 2021-01-08 DIAGNOSIS — R590 Localized enlarged lymph nodes: Secondary | ICD-10-CM

## 2021-01-08 MED ORDER — ALLOPURINOL 300 MG PO TABS: 300.0000 mg | ORAL_TABLET | Freq: Every day | ORAL | 1 refills | Status: DC

## 2021-01-08 MED ORDER — PROCHLORPERAZINE MALEATE 10 MG PO TABS: 10.0000 mg | ORAL_TABLET | Freq: Four times a day (QID) | ORAL | 0 refills | Status: DC | PRN

## 2021-01-08 MED ORDER — LIDOCAINE-PRILOCAINE 2.5-2.5 % EX CREA: 1.0000 "application " | TOPICAL_CREAM | CUTANEOUS | 0 refills | Status: DC | PRN

## 2021-01-08 NOTE — Progress Notes (Signed)
START ON PATHWAY REGIMEN - Lymphoma and CLL     A cycle is every 21 days:     Prednisone      Rituximab-xxxx      Cyclophosphamide      Doxorubicin      Vincristine   **Always confirm dose/schedule in your pharmacy ordering system**  Patient Characteristics: Diffuse Large B-Cell Lymphoma or Follicular Lymphoma, Grade 3B, First Line, Stage I and II, No Bulk Disease Type: Not Applicable Disease Type: Diffuse Large B-Cell Lymphoma Disease Type: Not Applicable Line of therapy: First Line Disease Characteristics: No Bulk Intent of Therapy: Curative Intent, Discussed with Patient 

## 2021-01-08 NOTE — Telephone Encounter (Signed)
Scheduled appt per 4/11 sch msg. Called pt, no answer. Left msg with appt date and time.

## 2021-01-09 ENCOUNTER — Ambulatory Visit (HOSPITAL_COMMUNITY): Payer: 59

## 2021-01-09 ENCOUNTER — Ambulatory Visit (HOSPITAL_COMMUNITY)
Admission: RE | Admit: 2021-01-09 | Discharge: 2021-01-09 | Disposition: A | Discharge status: Home / Self Care | Payer: 59 | Source: Ambulatory Visit | Attending: Hematology and Oncology | Admitting: Hematology and Oncology

## 2021-01-09 DIAGNOSIS — C8335 Diffuse large B-cell lymphoma, lymph nodes of inguinal region and lower limb: Secondary | ICD-10-CM | POA: Diagnosis present

## 2021-01-09 LAB — GLUCOSE, CAPILLARY: Glucose-Capillary: 95 mg/dL (ref 70–99)

## 2021-01-09 MED ORDER — FLUDEOXYGLUCOSE F - 18 (FDG) INJECTION
13.8000 | Freq: Once | INTRAVENOUS | Status: AC | PRN
  Administered 2021-01-09: 13.4 via INTRAVENOUS

## 2021-01-09 NOTE — Progress Notes (Signed)
[facility] Note   HPI:  60 y.o. Male presents to clinic for post-op follow-up evaluation of his left groin wound. He had no pain but had some minor drainage and wound dehiscence of the lateral edge. Prescribed bactrim pending worsening.   Review of Systems:  No drainage All other review of systems: otherwise negative   Vital Signs:  BP 117/76   Pulse 77   Temp (!) 97.3 F (36.3 C) (Other (Comment))   Resp 14   Ht 6\' 3"  (1.905 m)   Wt 267 lb (121.1 kg)   SpO2 96%   BMI 33.37 kg/m    Physical Exam:  Physical Exam Nursing note reviewed.  Cardiovascular:     Rate and Rhythm: Normal rate.  Pulmonary:     Effort: Pulmonary effort is normal.  Genitourinary:    Comments: Left groin incision intact, edge lateral with fibrinous tissue and scab, no erythema and non tender Neurological:     Mental Status: He is alert.       Assessment:  60 y.o. yo Male with area of dehiscence but minimal and not worsening. No signs of worsening infection.   Plan:  - He gets a Port at [facility] in upcoming weeks - He will start treatment - PRN Follow up, and he will left [place] know if there are issues   Curlene Labrum, MD [facility] [place], [place] [address] [phone] (office)

## 2021-01-11 ENCOUNTER — Telehealth: Payer: Self-pay | Admitting: Hematology and Oncology

## 2021-01-11 NOTE — Telephone Encounter (Signed)
Scheduled appts per 4/12 sch msg. Pt aware.  

## 2021-01-14 ENCOUNTER — Other Ambulatory Visit: Payer: 59

## 2021-01-14 NOTE — Progress Notes (Signed)
Pharmacist Chemotherapy Monitoring - Initial Assessment    Anticipated start date: 01/21/21   Regimen:  . Are orders appropriate based on the patient's diagnosis, regimen, and cycle? Yes . Does the plan date match the patient's scheduled date? Yes . Is the sequencing of drugs appropriate? Yes . Are the premedications appropriate for the patient's regimen? Yes . Prior Authorization for treatment is: pending o If applicable, is the correct biosimilar selected based on the patient's insurance? yes  Organ Function and Labs: Marland Kitchen Are dose adjustments needed based on the patient's renal function, hepatic function, or hematologic function? Yes . Are appropriate labs ordered prior to the start of patient's treatment? Yes . Other organ system assessment, if indicated: N/A . The following baseline labs, if indicated, have been ordered: rituximab: baseline Hepatitis B labs  Dose Assessment: . Are the drug doses appropriate? Yes . Are the following correct: o Drug concentrations Yes o IV fluid compatible with drug Yes o Administration routes Yes o Timing of therapy Yes . If applicable, does the patient have documented access for treatment and/or plans for port-a-cath placement? yes . If applicable, have lifetime cumulative doses been properly documented and assessed? no Lifetime Dose Tracking  No doses have been documented on this patient for the following tracked chemicals: Doxorubicin, Epirubicin, Idarubicin, Daunorubicin, Mitoxantrone, Bleomycin, Oxaliplatin, Carboplatin, Liposomal Doxorubicin  o   Toxicity Monitoring/Prevention: . The patient has the following take home antiemetics prescribed: Ondansetron and Prochlorperazine . The patient has the following take home medications prescribed: N/A . Medication allergies and previous infusion related reactions, if applicable, have been reviewed and addressed. No . The patient's current medication list has been assessed for drug-drug interactions  with their chemotherapy regimen. no significant drug-drug interactions were identified on review.  Order Review: . Are the treatment plan orders signed? No . Is the patient scheduled to see a provider prior to their treatment? Yes  I verify that I have reviewed each item in the above checklist and answered each question accordingly.  Larene Beach, [place], 01/14/2021  3:58 PM

## 2021-01-15 ENCOUNTER — Ambulatory Visit (HOSPITAL_COMMUNITY)
Admission: RE | Admit: 2021-01-15 | Discharge: 2021-01-15 | Disposition: A | Discharge status: Home / Self Care | Payer: 59 | Source: Ambulatory Visit | Attending: Hematology and Oncology | Admitting: Hematology and Oncology

## 2021-01-15 ENCOUNTER — Inpatient Hospital Stay: Payer: 59

## 2021-01-15 ENCOUNTER — Other Ambulatory Visit: Payer: Self-pay

## 2021-01-15 DIAGNOSIS — I10 Essential (primary) hypertension: Secondary | ICD-10-CM | POA: Insufficient documentation

## 2021-01-15 DIAGNOSIS — C8335 Diffuse large B-cell lymphoma, lymph nodes of inguinal region and lower limb: Secondary | ICD-10-CM | POA: Diagnosis not present

## 2021-01-15 DIAGNOSIS — Z0189 Encounter for other specified special examinations: Secondary | ICD-10-CM | POA: Diagnosis not present

## 2021-01-15 LAB — ECHOCARDIOGRAM COMPLETE
Area-P 1/2: 2.48 cm2
S' Lateral: 2.6 cm

## 2021-01-15 NOTE — Progress Notes (Signed)
  Echocardiogram 2D Echocardiogram has been performed.  Steve Smith 01/15/2021, 8:44 AM

## 2021-01-16 ENCOUNTER — Other Ambulatory Visit (HOSPITAL_COMMUNITY): Payer: Self-pay | Admitting: Physician Assistant

## 2021-01-17 ENCOUNTER — Other Ambulatory Visit: Payer: Self-pay

## 2021-01-17 ENCOUNTER — Other Ambulatory Visit: Payer: Self-pay | Admitting: Hematology and Oncology

## 2021-01-17 ENCOUNTER — Encounter (HOSPITAL_COMMUNITY): Payer: Self-pay

## 2021-01-17 ENCOUNTER — Ambulatory Visit (HOSPITAL_COMMUNITY)
Admission: RE | Admit: 2021-01-17 | Discharge: 2021-01-17 | Disposition: A | Discharge status: Home / Self Care | Payer: 59 | Source: Ambulatory Visit | Attending: Hematology and Oncology | Admitting: Hematology and Oncology

## 2021-01-17 DIAGNOSIS — I10 Essential (primary) hypertension: Secondary | ICD-10-CM | POA: Insufficient documentation

## 2021-01-17 DIAGNOSIS — Z85828 Personal history of other malignant neoplasm of skin: Secondary | ICD-10-CM | POA: Diagnosis not present

## 2021-01-17 DIAGNOSIS — Z79899 Other long term (current) drug therapy: Secondary | ICD-10-CM | POA: Insufficient documentation

## 2021-01-17 DIAGNOSIS — Z800 Family history of malignant neoplasm of digestive organs: Secondary | ICD-10-CM | POA: Insufficient documentation

## 2021-01-17 DIAGNOSIS — C8335 Diffuse large B-cell lymphoma, lymph nodes of inguinal region and lower limb: Secondary | ICD-10-CM

## 2021-01-17 DIAGNOSIS — Z808 Family history of malignant neoplasm of other organs or systems: Secondary | ICD-10-CM | POA: Insufficient documentation

## 2021-01-17 HISTORY — PX: IR IMAGING GUIDED PORT INSERTION: IMG5740

## 2021-01-17 MED ORDER — LIDOCAINE-EPINEPHRINE 1 %-1:100000 IJ SOLN
INTRAMUSCULAR | Status: AC
  Filled 2021-01-17: qty 1

## 2021-01-17 MED ORDER — MIDAZOLAM HCL 2 MG/2ML IJ SOLN
INTRAMUSCULAR | Status: AC
  Filled 2021-01-17: qty 2

## 2021-01-17 MED ORDER — FENTANYL CITRATE (PF) 100 MCG/2ML IJ SOLN
INTRAMUSCULAR | Status: AC | PRN
  Administered 2021-01-17: 50 ug via INTRAVENOUS

## 2021-01-17 MED ORDER — SODIUM CHLORIDE 0.9 % IV SOLN: INTRAVENOUS | Status: DC

## 2021-01-17 MED ORDER — FENTANYL CITRATE (PF) 100 MCG/2ML IJ SOLN
INTRAMUSCULAR | Status: AC
  Filled 2021-01-17: qty 2

## 2021-01-17 MED ORDER — MIDAZOLAM HCL 2 MG/2ML IJ SOLN
INTRAMUSCULAR | Status: AC | PRN
  Administered 2021-01-17 (×2): 1 mg via INTRAVENOUS

## 2021-01-17 MED ORDER — HEPARIN SOD (PORK) LOCK FLUSH 100 UNIT/ML IV SOLN
INTRAVENOUS | Status: AC
  Filled 2021-01-17: qty 5

## 2021-01-17 NOTE — H&P (Signed)
Chief Complaint: Large B-Cell Lymphoma  Referring Physician(s): Narda Rutherford  Supervising Physician: Mir, Sharen Heck  Patient Status: [facility] - Out-pt  History of Present Illness: Steve Smith is a 60 y.o. male with new diagnosis of Large B-cell lymphoma.  He underwent a lymph node biopsy by General Surgery on 12/26/2020. Pathology confirmed diagnosis.  He is here today for placement of a tunneled catheter with port.  He is NPO. No nausea/vomiting. No Fever/chills. ROS negative.   Past Medical History:  Diagnosis Date  . Basal cell carcinoma   . Hypertension   . Kidney stone     Past Surgical History:  Procedure Laterality Date  . INGUINAL LYMPH NODE BIOPSY Left 12/26/2020   Procedure: LEFT INGUINAL EXCISIONAL  LYMPH NODE BIOPSY;  Surgeon: Virl Cagey, MD;  Location: [facility];  Service: General;  Laterality: Left;  . ROTATOR CUFF REPAIR     Left    Allergies: Patient has no known allergies.  Medications: Prior to Admission medications   Medication Sig Start Date End Date Taking? Authorizing Provider  allopurinol (ZYLOPRIM) 300 MG tablet Take 1 tablet (300 mg total) by mouth daily. 01/08/21   Orson Slick, MD  hydrochlorothiazide (HYDRODIURIL) 25 MG tablet Take 25 mg by mouth daily.    [provider]  ibuprofen (ADVIL) 200 MG tablet Take 400-600 mg by mouth every 6 (six) hours as needed for headache or moderate pain.    [provider]  lidocaine-prilocaine (EMLA) cream Apply 1 application topically as needed. 01/08/21   Orson Slick, MD  olmesartan (BENICAR) 20 MG tablet Take 20 mg by mouth daily.    [provider]  ondansetron (ZOFRAN) 4 MG tablet Take 1 tablet (4 mg total) by mouth every 8 (eight) hours as needed. 12/26/20 12/26/21  Virl Cagey, MD  oxyCODONE (ROXICODONE) 5 MG immediate release tablet Take 1 tablet (5 mg total) by mouth every 4 (four) hours as needed for severe pain or breakthrough pain. 12/26/20 12/26/21   Virl Cagey, MD  prochlorperazine (COMPAZINE) 10 MG tablet Take 1 tablet (10 mg total) by mouth every 6 (six) hours as needed for nausea or vomiting. 01/08/21   Orson Slick, MD     Family History  Problem Relation Age of Onset  . Basal cell carcinoma Mother   . Liver cancer Father     Social History   Socioeconomic History  . Marital status: Married    Spouse name: Not on file  . Number of children: Not on file  . Years of education: Not on file  . Highest education level: Not on file  Occupational History  . Not on file  Tobacco Use  . Smoking status: Never Smoker  . Smokeless tobacco: Never Used  Substance and Sexual Activity  . Alcohol use: No  . Drug use: No  . Sexual activity: Not on file  Other Topics Concern  . Not on file  Social History Narrative  . Not on file   Social Determinants of Health   Financial Resource Strain: Not on file  Food Insecurity: Not on file  Transportation Needs: Not on file  Physical Activity: Not on file  Stress: Not on file  Social Connections: Not on file     Review of Systems: A 12 point ROS discussed and pertinent positives are indicated in the HPI above.  All other systems are negative.  Review of Systems  Vital Signs: BP 133/86  Pulse 72   Temp 98.5 F (36.9 C) (Oral)   Resp 18   SpO2 97%   Physical Exam Vitals reviewed.  Constitutional:      Appearance: Normal appearance.  HENT:     Head: Normocephalic and atraumatic.  Eyes:     Extraocular Movements: Extraocular movements intact.  Cardiovascular:     Rate and Rhythm: Normal rate and regular rhythm.  Pulmonary:     Effort: Pulmonary effort is normal. No respiratory distress.     Breath sounds: Normal breath sounds.  Abdominal:     General: There is no distension.     Palpations: Abdomen is soft.     Tenderness: There is no abdominal tenderness.  Musculoskeletal:        General: Normal range of motion.     Cervical back: Normal range of  motion.  Skin:    General: Skin is warm and dry.  Neurological:     General: No focal deficit present.     Mental Status: He is alert and oriented to person, place, and time.  Psychiatric:        Mood and Affect: Mood normal.        Behavior: Behavior normal.        Thought Content: Thought content normal.        Judgment: Judgment normal.     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 Left inguinal nodal biopsy 12/26/2020. EXAM: NUCLEAR MEDICINE PET SKULL BASE TO THIGH TECHNIQUE: 13.4 mCi F-18 FDG was injected intravenously. Full-ring PET imaging was performed from the skull base to thigh after the radiotracer. CT data was obtained and used for attenuation correction and anatomic localization. Fasting blood glucose: 95 mg/dl COMPARISON:  Abdominopelvic CT 01/03/2012.  No recent imaging. FINDINGS: Mediastinal blood pool activity: SUV max 3.0 Liver activity: SUV max 3.6 NECK: No hypermetabolic cervical lymph nodes are identified.There is mild asymmetric metabolic activity in the left palatine tonsil (SUV max 5.7) without obvious mucosal abnormality on CT images. The orbital activity is prominent, but symmetric and likely physiologic as well. Incidental CT findings: none CHEST: There are no hypermetabolic mediastinal, hilar or axillary lymph nodes. No hypermetabolic pulmonary activity or suspicious nodularity. Incidental CT findings: There are calcified mediastinal and left hilar lymph nodes as well as calcified left lung granulomas. Mild underlying emphysematous changes. ABDOMEN/PELVIS: There is diffuse hepatic steatosis. There are 2 focal hypermetabolic lesions within the right hepatic lobe which are not visible on the CT images. The largest lesion is located posteriorly in segment 7 and has an SUV max of 11.4. There is a smaller lesion more anteriorly in the right  lobe which has an SUV max of 7.1. The spleen is normal in size, although demonstrates multiple hypermetabolic masses which are faintly visible on the CT images as low-density lesions. The largest is located superiorly, measuring approximately 3.8 cm on image 98/4 and has an SUV max of 31.8. There are at least 7 splenic lesions. There is a postoperative fluid collection in the left groin from previous lymphadenectomy, measuring approximately 5.9 x 5.3 cm on image 206/4 which demonstrates mild peripheral metabolic activity. There are no hypermetabolic lymph nodes in the abdomen or pelvis. Nonspecific hypermetabolic activity centrally within the prostate gland which may reflect urine, especially if the patient has had previous trans urethral resection. This has an SUV max of 14.6. Incidental CT findings:  Underlying hepatic steatosis. There are scattered calcified splenic granulomas. Tiny nonobstructing left renal calculus and mild aortic atherosclerosis. Small bilateral scrotal hydroceles. SKELETON: There is no hypermetabolic activity to suggest osseous metastatic disease. Incidental CT findings: none IMPRESSION: 1. Multiple hypermetabolic splenic lesions compatible with lymphoma ([place] 5). The spleen is not significantly enlarged. 2. Two hypermetabolic liver lesions are nonspecific, but likely related to lymphoma as well. 3. No hypermetabolic lymph nodes in the neck, chest, abdomen or pelvis status post left inguinal lymphadenectomy. Minimal asymmetric activity in the left palatine tonsil, within physiologic limits. 4. Nonspecific central activity in the prostate gland. This could reflect urine in the prosthetic urethra, especially if the patient has undergone previous TURP. 5. Incidental findings including sequela of prior granulomatous disease, hepatic steatosis and a nonobstructing left renal calculus. Electronically Signed   By: Richardean Sale M.D.   On: 01/11/2021 10:03   ECHOCARDIOGRAM COMPLETE  Result  Date: 01/15/2021    ECHOCARDIOGRAM REPORT   Patient Name:   Steve Smith Date of Exam: 01/15/2021 Medical Rec #:  [id number]      Height:       75.0 in Accession #:    [id number]     Weight:       267.0 lb Date of Birth:  01-15-1961      BSA:          2.481 m Patient Age:    88 years       BP:           185/99 mmHg Patient Gender: M              HR:           67 bpm. Exam Location:  Outpatient Procedure: 2D Echo, Color Doppler, Cardiac Doppler and Strain Analysis Indications:    Chemo Z09  History:        Patient has no prior history of Echocardiogram examinations.                 Risk Factors:Hypertension.  Sonographer:    Bernadene Person RDCS Referring Phys: [id number] [facility]  1. Left ventricular ejection fraction, by estimation, is 65 to 70%. The left ventricle has normal function. The left ventricle has no regional wall motion abnormalities. Left ventricular diastolic parameters were normal. The average left ventricular global longitudinal strain is -26.0 %. The global longitudinal strain is normal.  2. Right ventricular systolic function is normal. The right ventricular size is normal.  3. The mitral valve is grossly normal. No evidence of mitral valve regurgitation.  4. The aortic valve is tricuspid. Aortic valve regurgitation is not visualized.  5. The inferior vena cava is normal in size with greater than 50% respiratory variability, suggesting right atrial pressure of 3 mmHg. Comparison(s): No prior Echocardiogram. Conclusion(s)/Recommendation(s): Normal biventricular function without evidence of hemodynamically significant valvular heart disease. FINDINGS  Left Ventricle: Left ventricular ejection fraction, by estimation, is 65 to 70%. The left ventricle has normal function. The left ventricle has no regional wall motion abnormalities. The average left ventricular global longitudinal strain is -26.0 %. The global longitudinal strain is normal. The left ventricular internal cavity size was  normal in size. There is borderline left ventricular hypertrophy. Left ventricular diastolic parameters were normal. Right Ventricle: The right ventricular size is normal. No increase in right ventricular wall thickness. Right ventricular systolic function is normal. Left Atrium: Left atrial size was normal in size. Right Atrium: Right atrial size was normal in size.  Pericardium: There is no evidence of pericardial effusion. Mitral Valve: The mitral valve is grossly normal. No evidence of mitral valve regurgitation. Tricuspid Valve: The tricuspid valve is normal in structure. Tricuspid valve regurgitation is not demonstrated. Aortic Valve: The aortic valve is tricuspid. Aortic valve regurgitation is not visualized. Pulmonic Valve: The pulmonic valve was grossly normal. Pulmonic valve regurgitation is not visualized. No evidence of pulmonic stenosis. Aorta: The aortic root, ascending aorta and aortic arch are all structurally normal, with no evidence of dilitation or obstruction. Venous: The inferior vena cava is normal in size with greater than 50% respiratory variability, suggesting right atrial pressure of 3 mmHg. IAS/Shunts: The atrial septum is grossly normal.  LEFT VENTRICLE PLAX 2D LVIDd:         4.60 cm  Diastology LVIDs:         2.60 cm  LV e' medial:    6.22 cm/s LV PW:         1.10 cm  LV E/e' medial:  10.7 LV IVS:        1.00 cm  LV e' lateral:   11.40 cm/s LVOT diam:     2.20 cm  LV E/e' lateral: 5.8 LV SV:         77 LV SV Index:   31       2D Longitudinal Strain LVOT Area:     3.80 cm 2D Strain GLS (A2C):   -23.0 %                         2D Strain GLS (A3C):   -23.7 %                         2D Strain GLS (A4C):   -31.5 %                         2D Strain GLS Avg:     -26.0 % RIGHT VENTRICLE RV S prime:     11.80 cm/s TAPSE (M-mode): 1.9 cm LEFT ATRIUM             Index       RIGHT ATRIUM           Index LA diam:        3.60 cm 1.45 cm/m  RA Area:     16.10 cm LA Vol (A2C):   42.5 ml 17.13 ml/m RA  Volume:   40.90 ml  16.48 ml/m LA Vol (A4C):   42.1 ml 16.97 ml/m LA Biplane Vol: 43.3 ml 17.45 ml/m  AORTIC VALVE LVOT Vmax:   91.80 cm/s LVOT Vmean:  64.500 cm/s LVOT VTI:    0.203 m  AORTA Ao Root diam: 3.20 cm Ao Asc diam:  3.20 cm MITRAL VALVE MV Area (PHT): 2.48 cm    SHUNTS MV Decel Time: 306 msec    Systemic VTI:  0.20 m MV E velocity: 66.40 cm/s  Systemic Diam: 2.20 cm MV A velocity: 67.90 cm/s MV E/A ratio:  0.98 Rudean Haskell MD Electronically signed by Rudean Haskell MD Signature Date/Time: 01/15/2021/9:02:57 AM    Final     Labs:  CBC: Recent Labs    12/25/20 1158 01/04/21 1016  WBC 5.2 6.3  HGB 15.6 16.9  HCT 46.6 49.4  PLT 210 239    COAGS: No results for input(s): INR, APTT in the last 8760 hours.  BMP: Recent Labs  12/25/20 1158 01/04/21 1016  NA 140 141  K 3.9 4.1  CL 108 104  CO2 22 22  GLUCOSE 97 114*  BUN 16 19  CALCIUM 9.3 9.6  CREATININE 1.11 1.47*  GFRNONAA >60 55*    LIVER FUNCTION TESTS: Recent Labs    01/04/21 1016  BILITOT 0.8  AST 18  ALT 19  ALKPHOS 103  PROT 7.9  ALBUMIN 4.3    TUMOR MARKERS: No results for input(s): AFPTM, CEA, CA199, CHROMGRNA in the last 8760 hours.  Assessment and Plan:  Large B-Cell Lymphoma  Will proceed with image guided placement of a tunneled catheter with port today by Dr. Dwaine Gale.  Risks and benefits of image guided port-a-catheter placement was discussed with the patient including, but not limited to bleeding, infection, pneumothorax, or fibrin sheath development and need for additional procedures.  All of the patient's questions were answered, patient is agreeable to proceed. Consent signed and in chart.  Thank you for this interesting consult.  I greatly enjoyed meeting Steve Smith and look forward to participating in their care.  A copy of this report was sent to the requesting provider on this date.  Electronically Signed: Murrell Redden, PA-C   01/17/2021, 1:42  PM      I spent a total of  15 Minutes in face to face in clinical consultation, greater than 50% of which was counseling/coordinating care for port a cath placement.

## 2021-01-17 NOTE — Procedures (Signed)
Interventional Radiology Procedure Note  Procedure: Chest port  Indication: Lymphoma  Findings: Please refer to procedural dictation for full description.  Complications: None  EBL: < 10 mL  Aveya Beal, MD [phone]   

## 2021-01-17 NOTE — Discharge Instructions (Signed)
Please call Interventional Radiology clinic [phone] with any questions or concerns.  You may remove your dressing and shower tomorrow.  DO NOT use EMLA cream for 2 weeks after port placement as this cream will remove surgical glue on your incision.   Implanted Port Insertion, Care After This sheet gives you information about how to care for yourself after your procedure. Your health care provider may also give you more specific instructions. If you have problems or questions, contact your health care provider. What can I expect after the procedure? After the procedure, it is common to have:  Discomfort at the port insertion site.  Bruising on the skin over the port. This should improve over 3-4 days. Follow these instructions at home: Port care  After your port is placed, you will get a manufacturer's information card. The card has information about your port. Keep this card with you at all times.  Take care of the port as told by your health care provider. Ask your health care provider if you or a family member can get training for taking care of the port at home. A home health care nurse may also take care of the port.  Make sure to remember what type of port you have. Incision care  Follow instructions from your health care provider about how to take care of your port insertion site. Make sure you: ? Wash your hands with soap and water before and after you change your bandage (dressing). If soap and water are not available, use hand sanitizer. ? Change your dressing as told by your health care provider. ? Leave stitches (sutures), skin glue, or adhesive strips in place. These skin closures may need to stay in place for 2 weeks or longer. If adhesive strip edges start to loosen and curl up, you may trim the loose edges. Do not remove adhesive strips completely unless your health care provider tells you to do that.  Check your port insertion site every day for signs of infection.  Check for: ? Redness, swelling, or pain. ? Fluid or blood. ? Warmth. ? Pus or a bad smell.      Activity  Return to your normal activities as told by your health care provider. Ask your health care provider what activities are safe for you.  Do not lift anything that is heavier than 10 lb (4.5 kg), or the limit that you are told, until your health care provider says that it is safe. General instructions  Take over-the-counter and prescription medicines only as told by your health care provider.  Do not take baths, swim, or use a hot tub until your health care provider approves. Ask your health care provider if you may take showers. You may only be allowed to take sponge baths.  Do not drive for 24 hours if you were given a sedative during your procedure.  Wear a medical alert bracelet in case of an emergency. This will tell any health care providers that you have a port.  Keep all follow-up visits as told by your health care provider. This is important. Contact a health care provider if:  You cannot flush your port with saline as directed, or you cannot draw blood from the port.  You have a fever or chills.  You have redness, swelling, or pain around your port insertion site.  You have fluid or blood coming from your port insertion site.  Your port insertion site feels warm to the touch.  You have pus or a   bad smell coming from the port insertion site. Get help right away if:  You have chest pain or shortness of breath.  You have bleeding from your port that you cannot control. Summary  Take care of the port as told by your health care provider. Keep the manufacturer's information card with you at all times.  Change your dressing as told by your health care provider.  Contact a health care provider if you have a fever or chills or if you have redness, swelling, or pain around your port insertion site.  Keep all follow-up visits as told by your health care  provider. This information is not intended to replace advice given to you by your health care provider. Make sure you discuss any questions you have with your health care provider. Document Revised: 04/13/2018 Document Reviewed: 04/13/2018 Elsevier Patient Education  2021 [employer]    Moderate Conscious Sedation, Adult, Care After This sheet gives you information about how to care for yourself after your procedure. Your health care provider may also give you more specific instructions. If you have problems or questions, contact your health care provider. What can I expect after the procedure? After the procedure, it is common to have:  Sleepiness for several hours.  Impaired judgment for several hours.  Difficulty with balance.  Vomiting if you eat too soon. Follow these instructions at home: For the time period you were told by your health care provider:  Rest.  Do not participate in activities where you could fall or become injured.  Do not drive or use machinery.  Do not drink alcohol.  Do not take sleeping pills or medicines that cause drowsiness.  Do not make important decisions or sign legal documents.  Do not take care of children on your own.      Eating and drinking  Follow the diet recommended by your health care provider.  Drink enough fluid to keep your urine pale yellow.  If you vomit: ? Drink water, juice, or soup when you can drink without vomiting. ? Make sure you have little or no nausea before eating solid foods.   General instructions  Take over-the-counter and prescription medicines only as told by your health care provider.  Have a responsible adult stay with you for the time you are told. It is important to have someone help care for you until you are awake and alert.  Do not smoke.  Keep all follow-up visits as told by your health care provider. This is important. Contact a health care provider if:  You are still sleepy or having  trouble with balance after 24 hours.  You feel light-headed.  You keep feeling nauseous or you keep vomiting.  You develop a rash.  You have a fever.  You have redness or swelling around the IV site. Get help right away if:  You have trouble breathing.  You have new-onset confusion at home. Summary  After the procedure, it is common to feel sleepy, have impaired judgment, or feel nauseous if you eat too soon.  Rest after you get home. Know the things you should not do after the procedure.  Follow the diet recommended by your health care provider and drink enough fluid to keep your urine pale yellow.  Get help right away if you have trouble breathing or new-onset confusion at home. This information is not intended to replace advice given to you by your health care provider. Make sure you discuss any questions you have with your health   care provider. Document Revised: 01/13/2020 Document Reviewed: 08/11/2019 Elsevier Patient Education  2021 [employer] Inc.  

## 2021-01-20 ENCOUNTER — Other Ambulatory Visit: Payer: Self-pay | Admitting: Hematology and Oncology

## 2021-01-20 DIAGNOSIS — C8335 Diffuse large B-cell lymphoma, lymph nodes of inguinal region and lower limb: Secondary | ICD-10-CM

## 2021-01-20 NOTE — Progress Notes (Signed)
[place] [phone]   [phone]  PROGRESS NOTE  Patient Care Team: [employer], MD as PCP - General (Internal Medicine)  Hematological/Oncological History # Diffuse Large B Cell Lymphoma, Stage IV 12/17/2020: CT scan at [facility], results show left inguinal adenopathy and multiple solid splenic lesions probably of lymphoid origin  12/26/2020: excisional biopsy of left inguinal lymph node shows DLBCL.  01/04/2021: establish care with Dr. Lorenso Courier  01/09/2021: PET CT scan shows multiple hypermetabolic splenic lesions compatible with lymphoma.  There are also 2 hypermetabolic liver lesions also likely related to lymphoma.  No evidence of hypermetabolic activity in the neck, chest, abdomen or pelvis elsewhere. 01/21/2021: Cycle 1 Day 1 of R-CHOP chemotherapy  Interval History:  Steve Smith 60 y.o. male with medical history significant for Stage IV DLBCL who presents for a follow up visit. The patient's last visit was on 01/04/2021. In the interim since the last visit Steve Smith has completed a PET CT scan which shows concern for splenic and hepatic involvement of his diffuse large B-cell lymphoma.  On exam today Steve Smith is accompanied by his wife.  He reports that for the last week or so he has been breaking out in periodic sweats.  He notes that otherwise his appetite has been good and he has been having no issues with nausea, vomiting, or diarrhea.  He has no focal infectious symptoms.  A full 10 point ROS is listed below.  Today the bulk of our discussion focused on ensuring he had everything he needed to start cycle 1 day one of his chemotherapy.  MEDICAL HISTORY:  Past Medical History:  Diagnosis Date  . Basal cell carcinoma   . Hypertension   . Kidney stone     SURGICAL HISTORY: Past Surgical History:  Procedure Laterality Date  . INGUINAL LYMPH NODE BIOPSY Left 12/26/2020   Procedure: LEFT INGUINAL EXCISIONAL  LYMPH NODE BIOPSY;  Surgeon:  Virl Cagey, MD;  Location: [facility];  Service: General;  Laterality: Left;  . IR IMAGING GUIDED PORT INSERTION  01/17/2021  . ROTATOR CUFF REPAIR     Left    SOCIAL HISTORY: Social History   Socioeconomic History  . Marital status: Married    Spouse name: Not on file  . Number of children: Not on file  . Years of education: Not on file  . Highest education level: Not on file  Occupational History  . Not on file  Tobacco Use  . Smoking status: Never Smoker  . Smokeless tobacco: Never Used  Substance and Sexual Activity  . Alcohol use: No  . Drug use: No  . Sexual activity: Not on file  Other Topics Concern  . Not on file  Social History Narrative  . Not on file   Social Determinants of Health   Financial Resource Strain: Not on file  Food Insecurity: Not on file  Transportation Needs: Not on file  Physical Activity: Not on file  Stress: Not on file  Social Connections: Not on file  Intimate Partner Violence: Not on file    FAMILY HISTORY: Family History  Problem Relation Age of Onset  . Basal cell carcinoma Mother   . Liver cancer Father     ALLERGIES:  has No Known Allergies.  MEDICATIONS:  Current Outpatient Medications  Medication Sig Dispense Refill  . olmesartan (BENICAR) 20 MG tablet Take 20 mg by mouth daily.    . pantoprazole (PROTONIX) 40 MG tablet Take 1 tablet (40 mg total)  by mouth daily. 90 each 1  . predniSONE (DELTASONE) 20 MG tablet Take 3 tablets (60 mg total) by mouth as directed. Take 3 tablets in the morning on Day 1 of chemotherapy. Take 3 tablets in the morning for the next 4 days. 15 tablet 5  . prochlorperazine (COMPAZINE) 10 MG tablet Take 1 tablet (10 mg total) by mouth every 6 (six) hours as needed for nausea or vomiting. 30 tablet 0  . allopurinol (ZYLOPRIM) 300 MG tablet Take 1 tablet (300 mg total) by mouth daily. 90 tablet 1  . hydrochlorothiazide (HYDRODIURIL) 25 MG tablet Take 25 mg by mouth daily.    Marland Kitchen  lidocaine-prilocaine (EMLA) cream Apply 1 application topically as needed. 30 g 0  . ondansetron (ZOFRAN) 4 MG tablet Take 1 tablet (4 mg total) by mouth every 8 (eight) hours as needed. 30 tablet 0  . oxyCODONE (ROXICODONE) 5 MG immediate release tablet Take 1 tablet (5 mg total) by mouth every 4 (four) hours as needed for severe pain or breakthrough pain. (Patient not taking: Reported on 01/21/2021) 5 tablet 0   No current facility-administered medications for this visit.    REVIEW OF SYSTEMS:   Constitutional: ( - ) fevers, ( - )  chills , ( - ) night sweats Eyes: ( - ) blurriness of vision, ( - ) double vision, ( - ) watery eyes Ears, nose, mouth, throat, and face: ( - ) mucositis, ( - ) sore throat Respiratory: ( - ) cough, ( - ) dyspnea, ( - ) wheezes Cardiovascular: ( - ) palpitation, ( - ) chest discomfort, ( - ) lower extremity swelling Gastrointestinal:  ( - ) nausea, ( - ) heartburn, ( - ) change in bowel habits Skin: ( - ) abnormal skin rashes Lymphatics: ( - ) new lymphadenopathy, ( - ) easy bruising Neurological: ( - ) numbness, ( - ) tingling, ( - ) new weaknesses Behavioral/Psych: ( - ) mood change, ( - ) new changes  All other systems were reviewed with the patient and are negative.  PHYSICAL EXAMINATION: ECOG PERFORMANCE STATUS: 1 - Symptomatic but completely ambulatory  Vitals:   01/21/21 0823  BP: 133/87  Pulse: 87  Resp: 18  Temp: 97.6 F (36.4 C)  SpO2: 99%   Filed Weights   01/21/21 0823  Weight: 267 lb 3.2 oz (121.2 kg)    GENERAL: well appearing middle aged Caucasian male. alert, no distress and comfortable SKIN: skin color, texture, turgor are normal, no rashes or significant lesions EYES: conjunctiva are pink and non-injected, sclera clear LUNGS: clear to auscultation and percussion with normal breathing effort HEART: regular rate & rhythm and no murmurs and no lower extremity edema Musculoskeletal: no cyanosis of digits and no clubbing  PSYCH:  alert & oriented x 3, fluent speech NEURO: no focal motor/sensory deficits  LABORATORY DATA:  I have reviewed the data as listed CBC Latest Ref Rng & Units 01/21/2021 01/04/2021 12/25/2020  WBC 4.0 - 10.5 K/uL 6.0 6.3 5.2  Hemoglobin 13.0 - 17.0 g/dL 15.4 16.9 15.6  Hematocrit 39.0 - 52.0 % 46.1 49.4 46.6  Platelets 150 - 400 K/uL 187 239 210    CMP Latest Ref Rng & Units 01/21/2021 01/04/2021 12/25/2020  Glucose 70 - 99 mg/dL 115(H) 114(H) 97  BUN 6 - 20 mg/dL 17 19 16   Creatinine 0.61 - 1.24 mg/dL 1.28(H) 1.47(H) 1.11  Sodium 135 - 145 mmol/L 140 141 140  Potassium 3.5 - 5.1 mmol/L 3.8 4.1 3.9  Chloride 98 - 111 mmol/L 102 104 108  CO2 22 - 32 mmol/L 27 22 22   Calcium 8.9 - 10.3 mg/dL 9.4 9.6 9.3  Total Protein 6.5 - 8.1 g/dL 7.0 7.9 -  Total Bilirubin 0.3 - 1.2 mg/dL 0.5 0.8 -  Alkaline Phos 38 - 126 U/L 90 103 -  AST 15 - 41 U/L 14(L) 18 -  ALT 0 - 44 U/L 13 19 -    RADIOGRAPHIC STUDIES: I have personally reviewed the radiological images as listed and agreed with the findings in the report: Hypermetabolic lesions in the spleen and liver consistent with metastatic disease.  NM PET Image Initial (PI) Skull Base To Thigh  Result Date: 01/11/2021 CLINICAL DATA:  Initial treatment strategy for diffuse large B-cell lymphoma of inguinal region. Left inguinal nodal biopsy 12/26/2020. EXAM: NUCLEAR MEDICINE PET SKULL BASE TO THIGH TECHNIQUE: 13.4 mCi F-18 FDG was injected intravenously. Full-ring PET imaging was performed from the skull base to thigh after the radiotracer. CT data was obtained and used for attenuation correction and anatomic localization. Fasting blood glucose: 95 mg/dl COMPARISON:  Abdominopelvic CT 01/03/2012.  No recent imaging. FINDINGS: Mediastinal blood pool activity: SUV max 3.0 Liver activity: SUV max 3.6 NECK: No hypermetabolic cervical lymph nodes are identified.There is mild asymmetric metabolic activity in the left palatine tonsil (SUV max 5.7) without obvious mucosal  abnormality on CT images. The orbital activity is prominent, but symmetric and likely physiologic as well. Incidental CT findings: none CHEST: There are no hypermetabolic mediastinal, hilar or axillary lymph nodes. No hypermetabolic pulmonary activity or suspicious nodularity. Incidental CT findings: There are calcified mediastinal and left hilar lymph nodes as well as calcified left lung granulomas. Mild underlying emphysematous changes. ABDOMEN/PELVIS: There is diffuse hepatic steatosis. There are 2 focal hypermetabolic lesions within the right hepatic lobe which are not visible on the CT images. The largest lesion is located posteriorly in segment 7 and has an SUV max of 11.4. There is a smaller lesion more anteriorly in the right lobe which has an SUV max of 7.1. The spleen is normal in size, although demonstrates multiple hypermetabolic masses which are faintly visible on the CT images as low-density lesions. The largest is located superiorly, measuring approximately 3.8 cm on image 98/4 and has an SUV max of 31.8. There are at least 7 splenic lesions. There is a postoperative fluid collection in the left groin from previous lymphadenectomy, measuring approximately 5.9 x 5.3 cm on image 206/4 which demonstrates mild peripheral metabolic activity. There are no hypermetabolic lymph nodes in the abdomen or pelvis. Nonspecific hypermetabolic activity centrally within the prostate gland which may reflect urine, especially if the patient has had previous trans urethral resection. This has an SUV max of 14.6. Incidental CT findings: Underlying hepatic steatosis. There are scattered calcified splenic granulomas. Tiny nonobstructing left renal calculus and mild aortic atherosclerosis. Small bilateral scrotal hydroceles. SKELETON: There is no hypermetabolic activity to suggest osseous metastatic disease. Incidental CT findings: none IMPRESSION: 1. Multiple hypermetabolic splenic lesions compatible with lymphoma  ([place] 5). The spleen is not significantly enlarged. 2. Two hypermetabolic liver lesions are nonspecific, but likely related to lymphoma as well. 3. No hypermetabolic lymph nodes in the neck, chest, abdomen or pelvis status post left inguinal lymphadenectomy. Minimal asymmetric activity in the left palatine tonsil, within physiologic limits. 4. Nonspecific central activity in the prostate gland. This could reflect urine in the prosthetic urethra, especially if the patient has undergone previous TURP. 5. Incidental findings including sequela of  prior granulomatous disease, hepatic steatosis and a nonobstructing left renal calculus. Electronically Signed   By: Richardean Sale M.D.   On: 01/11/2021 10:03   ECHOCARDIOGRAM COMPLETE  Result Date: 01/15/2021    ECHOCARDIOGRAM REPORT   Patient Name:   Steve Smith Date of Exam: 01/15/2021 Medical Rec #:  [id number]      Height:       75.0 in Accession #:    [id number]     Weight:       267.0 lb Date of Birth:  12-11-1960      BSA:          2.481 m Patient Age:    85 years       BP:           185/99 mmHg Patient Gender: M              HR:           67 bpm. Exam Location:  Outpatient Procedure: 2D Echo, Color Doppler, Cardiac Doppler and Strain Analysis Indications:    Chemo Z09  History:        Patient has no prior history of Echocardiogram examinations.                 Risk Factors:Hypertension.  Sonographer:    Bernadene Person RDCS Referring Phys: [id number] [place]  1. Left ventricular ejection fraction, by estimation, is 65 to 70%. The left ventricle has normal function. The left ventricle has no regional wall motion abnormalities. Left ventricular diastolic parameters were normal. The average left ventricular global longitudinal strain is -26.0 %. The global longitudinal strain is normal.  2. Right ventricular systolic function is normal. The right ventricular size is normal.  3. The mitral valve is grossly normal. No evidence of mitral valve  regurgitation.  4. The aortic valve is tricuspid. Aortic valve regurgitation is not visualized.  5. The inferior vena cava is normal in size with greater than 50% respiratory variability, suggesting right atrial pressure of 3 mmHg. Comparison(s): No prior Echocardiogram. Conclusion(s)/Recommendation(s): Normal biventricular function without evidence of hemodynamically significant valvular heart disease. FINDINGS  Left Ventricle: Left ventricular ejection fraction, by estimation, is 65 to 70%. The left ventricle has normal function. The left ventricle has no regional wall motion abnormalities. The average left ventricular global longitudinal strain is -26.0 %. The global longitudinal strain is normal. The left ventricular internal cavity size was normal in size. There is borderline left ventricular hypertrophy. Left ventricular diastolic parameters were normal. Right Ventricle: The right ventricular size is normal. No increase in right ventricular wall thickness. Right ventricular systolic function is normal. Left Atrium: Left atrial size was normal in size. Right Atrium: Right atrial size was normal in size. Pericardium: There is no evidence of pericardial effusion. Mitral Valve: The mitral valve is grossly normal. No evidence of mitral valve regurgitation. Tricuspid Valve: The tricuspid valve is normal in structure. Tricuspid valve regurgitation is not demonstrated. Aortic Valve: The aortic valve is tricuspid. Aortic valve regurgitation is not visualized. Pulmonic Valve: The pulmonic valve was grossly normal. Pulmonic valve regurgitation is not visualized. No evidence of pulmonic stenosis. Aorta: The aortic root, ascending aorta and aortic arch are all structurally normal, with no evidence of dilitation or obstruction. Venous: The inferior vena cava is normal in size with greater than 50% respiratory variability, suggesting right atrial pressure of 3 mmHg. IAS/Shunts: The atrial septum is grossly normal.  LEFT  VENTRICLE PLAX 2D LVIDd:  4.60 cm  Diastology LVIDs:         2.60 cm  LV e' medial:    6.22 cm/s LV PW:         1.10 cm  LV E/e' medial:  10.7 LV IVS:        1.00 cm  LV e' lateral:   11.40 cm/s LVOT diam:     2.20 cm  LV E/e' lateral: 5.8 LV SV:         77 LV SV Index:   31       2D Longitudinal Strain LVOT Area:     3.80 cm 2D Strain GLS (A2C):   -23.0 %                         2D Strain GLS (A3C):   -23.7 %                         2D Strain GLS (A4C):   -31.5 %                         2D Strain GLS Avg:     -26.0 % RIGHT VENTRICLE RV S prime:     11.80 cm/s TAPSE (M-mode): 1.9 cm LEFT ATRIUM             Index       RIGHT ATRIUM           Index LA diam:        3.60 cm 1.45 cm/m  RA Area:     16.10 cm LA Vol (A2C):   42.5 ml 17.13 ml/m RA Volume:   40.90 ml  16.48 ml/m LA Vol (A4C):   42.1 ml 16.97 ml/m LA Biplane Vol: 43.3 ml 17.45 ml/m  AORTIC VALVE LVOT Vmax:   91.80 cm/s LVOT Vmean:  64.500 cm/s LVOT VTI:    0.203 m  AORTA Ao Root diam: 3.20 cm Ao Asc diam:  3.20 cm MITRAL VALVE MV Area (PHT): 2.48 cm    SHUNTS MV Decel Time: 306 msec    Systemic VTI:  0.20 m MV E velocity: 66.40 cm/s  Systemic Diam: 2.20 cm MV A velocity: 67.90 cm/s MV E/A ratio:  0.98 Rudean Haskell MD Electronically signed by Rudean Haskell MD Signature Date/Time: 01/15/2021/9:02:57 AM    Final    IR IMAGING GUIDED PORT INSERTION  Result Date: 01/17/2021 INDICATION: Diffuse large B-cell lymphoma EXAM: IMPLANTED PORT A CATH PLACEMENT WITH ULTRASOUND AND FLUOROSCOPIC GUIDANCE MEDICATIONS: None ANESTHESIA/SEDATION: Moderate (conscious) sedation was employed during this procedure. A total of Versed 2 mg and Fentanyl 50 mcg was administered intravenously. Moderate Sedation Time: 15 minutes. The patient's level of consciousness and vital signs were monitored continuously by radiology nursing throughout the procedure under my direct supervision. FLUOROSCOPY TIME:  0 minutes, 30 seconds (8 mGy) COMPLICATIONS: None  immediate. PROCEDURE: The procedure, risks, benefits, and alternatives were explained to the patient. Questions regarding the procedure were encouraged and answered. The patient understands and consents to the procedure. A timeout was performed prior to the initiation of the procedure. Patient positioned supine on the angiography table. Right neck and anterior upper chest prepped and draped in the usual sterile fashion. All elements of maximal sterile barrier were utilized including, cap, mask, sterile gown, sterile gloves, large sterile drape, hand scrubbing and 2% Chlorhexidine for skin cleaning. The right internal jugular vein was evaluated with ultrasound  and shown to be patent. A permanent ultrasound image was obtained and placed in the patient's medical record. Local anesthesia was provided with 1% lidocaine with epinephrine. Using sterile gel and a sterile probe cover, the right internal jugular vein was entered with a 21 ga needle during real time ultrasound guidance. 0.018 inch guidewire placed and 21 ga needle exchanged for transitional dilator set. Utilizing fluoroscopy, 0.035 inch guidewire advanced through the needle without difficulty. Attention then turned to the right anterior upper chest. Following local lidocaine administration, a port pocket was created. The catheter was connected to the port and brought from the pocket to the venotomy site through a subcutaneous tunnel. The catheter was cut to size and inserted through the peel-away sheath. The catheter tip was positioned at the cavoatrial junction using fluoroscopic guidance. The port aspirated and flushed well. The port pocket was closed with deep and superficial absorbable suture. The port pocket incision and venotomy sites were also sealed with Dermabond. IMPRESSION: Successful placement of a right internal jugular approach power injectable Port-A-Cath. The catheter is ready for immediate use. Electronically Signed   By: Miachel Roux M.D.    On: 01/17/2021 15:28    ASSESSMENT & PLAN Steve Smith 60 y.o. male with medical history significant for Stage IV DLBCL who presents for a follow up visit.  After review the labs, the records, schedule the patient the findings most consistent with a diffuse large B-cell lymphoma with staging in process.  We completed the staging with a PET CT scan which showed involvement of the liver and spleen.  At this time his disease does appear as late stage and we will plan for at least 6 cycles of R-CHOP followed by repeat scans.  Previously we discussed the risks and benefits of R-CHOP chemotherapy.  We noted the nature of diffuse large B-cell lymphoma and the possibility of achieving a complete remission with this chemotherapy regimen.  I also noted the side effects which include but are not limited to nausea, vomiting, hair loss, diarrhea, sedation, insomnia, hyperglycemia, high blood pressure, and cardiotoxicity.  The patient voices understanding of the risk and benefits and was agreeable to proceeding with treatment at this time.  The R-CHOP regimen consists of rituximab 375 mg per metered squared IV on day 1, cyclophosphamide 750 mg per metered squared on day 1, doxorubicin 50 mg/m on day 1, vincristine 1.4 mg per metered squared with a max dose of 2 mg on day 1, and prednisone 100 mg orally days 1 through 5.  A cycle lasts for 21 days and number of cycles planned is 6.   IPI Score: 3 points. (53% progression free survival).   # Diffuse Large B Cell Lymphoma, Stage IV --Findings at this time are most consistent with a Stage IV diffuse large B-cell lymphoma  He is Stage IV, will require 6 cycles of therapy.  --Baseline labs to include CBC, CMP and LDH. --Today is Cycle 1 Day 1 of R-CHOP --Have the patient return to clinic for Cycle 2 Day 1 on 02/11/2021 with interval weekly labs.   #Supportive Care --chemotherapy education completed --port in place.  --zofran 8mg  q8H PRN and compazine 10mg  PO  q6H for nausea --port placement to be scheduled. -- allopurinol 300mg  PO daily for TLS prophylaxis -- EMLA cream for port -- no pain medication required at this time.   No orders of the defined types were placed in this encounter.   All questions were answered. The patient knows to call the  clinic with any problems, questions or concerns.  A total of more than 30 minutes were spent on this encounter and over half of that time was spent on counseling and coordination of care as outlined above.   Ledell Peoples, MD Department of Hematology/Oncology [place] [facility] Phone: [phone] Pager: [phone] Email: [email or web address]  01/22/2021 11:05 AM

## 2021-01-21 ENCOUNTER — Inpatient Hospital Stay: Payer: 59 | Admitting: Hematology and Oncology

## 2021-01-21 ENCOUNTER — Inpatient Hospital Stay: Payer: 59

## 2021-01-21 ENCOUNTER — Other Ambulatory Visit: Payer: Self-pay

## 2021-01-21 VITALS — BP 133/87 | HR 87 | Temp 97.6°F | Resp 18 | Wt 267.2 lb

## 2021-01-21 VITALS — BP 111/63 | HR 84 | Temp 97.9°F | Resp 18

## 2021-01-21 DIAGNOSIS — C8335 Diffuse large B-cell lymphoma, lymph nodes of inguinal region and lower limb: Secondary | ICD-10-CM | POA: Diagnosis not present

## 2021-01-21 DIAGNOSIS — Z5112 Encounter for antineoplastic immunotherapy: Secondary | ICD-10-CM | POA: Diagnosis not present

## 2021-01-21 LAB — CBC WITH DIFFERENTIAL (CANCER CENTER ONLY)
Abs Immature Granulocytes: 0.01 10*3/uL (ref 0.00–0.07)
Basophils Absolute: 0.1 10*3/uL (ref 0.0–0.1)
Basophils Relative: 1 %
Eosinophils Absolute: 0.3 10*3/uL (ref 0.0–0.5)
Eosinophils Relative: 5 %
HCT: 46.1 % (ref 39.0–52.0)
Hemoglobin: 15.4 g/dL (ref 13.0–17.0)
Immature Granulocytes: 0 %
Lymphocytes Relative: 25 %
Lymphs Abs: 1.5 10*3/uL (ref 0.7–4.0)
MCH: 29.8 pg (ref 26.0–34.0)
MCHC: 33.4 g/dL (ref 30.0–36.0)
MCV: 89.2 fL (ref 80.0–100.0)
Monocytes Absolute: 0.5 10*3/uL (ref 0.1–1.0)
Monocytes Relative: 8 %
Neutro Abs: 3.7 10*3/uL (ref 1.7–7.7)
Neutrophils Relative %: 61 %
Platelet Count: 187 10*3/uL (ref 150–400)
RBC: 5.17 MIL/uL (ref 4.22–5.81)
RDW: 12.8 % (ref 11.5–15.5)
WBC Count: 6 10*3/uL (ref 4.0–10.5)
nRBC: 0 % (ref 0.0–0.2)

## 2021-01-21 LAB — CMP (CANCER CENTER ONLY)
ALT: 13 U/L (ref 0–44)
AST: 14 U/L — ABNORMAL LOW (ref 15–41)
Albumin: 3.8 g/dL (ref 3.5–5.0)
Alkaline Phosphatase: 90 U/L (ref 38–126)
Anion gap: 11 (ref 5–15)
BUN: 17 mg/dL (ref 6–20)
CO2: 27 mmol/L (ref 22–32)
Calcium: 9.4 mg/dL (ref 8.9–10.3)
Chloride: 102 mmol/L (ref 98–111)
Creatinine: 1.28 mg/dL — ABNORMAL HIGH (ref 0.61–1.24)
GFR, Estimated: >60 mL/min (ref >60)
Glucose, Bld: 115 mg/dL — ABNORMAL HIGH (ref 70–99)
Potassium: 3.8 mmol/L (ref 3.5–5.1)
Sodium: 140 mmol/L (ref 135–145)
Total Bilirubin: 0.5 mg/dL (ref 0.3–1.2)
Total Protein: 7 g/dL (ref 6.5–8.1)

## 2021-01-21 LAB — URIC ACID: Uric Acid, Serum: 6.6 mg/dL (ref 3.7–8.6)

## 2021-01-21 LAB — LACTATE DEHYDROGENASE: LDH: 209 U/L — ABNORMAL HIGH (ref 98–192)

## 2021-01-21 MED ORDER — PEGFILGRASTIM 6 MG/0.6ML ~~LOC~~ PSKT
6.0000 mg | PREFILLED_SYRINGE | Freq: Once | SUBCUTANEOUS | Status: AC
  Administered 2021-01-21: 6 mg via SUBCUTANEOUS

## 2021-01-21 MED ORDER — DIPHENHYDRAMINE HCL 25 MG PO CAPS
50.0000 mg | ORAL_CAPSULE | Freq: Once | ORAL | Status: AC
  Administered 2021-01-21: 50 mg via ORAL

## 2021-01-21 MED ORDER — RITUXIMAB-PVVR CHEMO 500 MG/50ML IV SOLN
375.0000 mg/m2 | Freq: Once | INTRAVENOUS | Status: AC
  Administered 2021-01-21: 900 mg via INTRAVENOUS
  Filled 2021-01-21: qty 40

## 2021-01-21 MED ORDER — PALONOSETRON HCL INJECTION 0.25 MG/5ML
0.2500 mg | Freq: Once | INTRAVENOUS | Status: AC
  Administered 2021-01-21: 0.25 mg via INTRAVENOUS

## 2021-01-21 MED ORDER — SODIUM CHLORIDE 0.9 % IV SOLN
Freq: Once | INTRAVENOUS | Status: AC
  Filled 2021-01-21: qty 250

## 2021-01-21 MED ORDER — SODIUM CHLORIDE 0.9 % IV SOLN
150.0000 mg | Freq: Once | INTRAVENOUS | Status: AC
  Administered 2021-01-21: 150 mg via INTRAVENOUS
  Filled 2021-01-21: qty 150

## 2021-01-21 MED ORDER — DOXORUBICIN HCL CHEMO IV INJECTION 2 MG/ML
50.0000 mg/m2 | Freq: Once | INTRAVENOUS | Status: AC
  Administered 2021-01-21: 126 mg via INTRAVENOUS
  Filled 2021-01-21: qty 63

## 2021-01-21 MED ORDER — SODIUM CHLORIDE 0.9% FLUSH
10.0000 mL | INTRAVENOUS | Status: DC | PRN
  Administered 2021-01-21: 10 mL
  Filled 2021-01-21: qty 10

## 2021-01-21 MED ORDER — PREDNISONE 20 MG PO TABS
60.0000 mg | ORAL_TABLET | Freq: Once | ORAL | Status: AC
Start: 2021-01-21 — End: 2021-01-21
  Administered 2021-01-21: 60 mg via ORAL

## 2021-01-21 MED ORDER — VINCRISTINE SULFATE CHEMO INJECTION 1 MG/ML
2.0000 mg | Freq: Once | INTRAVENOUS | Status: AC
  Administered 2021-01-21: 2 mg via INTRAVENOUS
  Filled 2021-01-21: qty 2

## 2021-01-21 MED ORDER — SODIUM CHLORIDE 0.9 % IV SOLN
750.0000 mg/m2 | Freq: Once | INTRAVENOUS | Status: AC
  Administered 2021-01-21: 1900 mg via INTRAVENOUS
  Filled 2021-01-21: qty 95

## 2021-01-21 MED ORDER — SODIUM CHLORIDE 0.9 % IV SOLN
10.0000 mg | Freq: Once | INTRAVENOUS | Status: AC
  Administered 2021-01-21: 10 mg via INTRAVENOUS
  Filled 2021-01-21: qty 10

## 2021-01-21 MED ORDER — ACETAMINOPHEN 325 MG PO TABS
ORAL_TABLET | ORAL | Status: AC
  Filled 2021-01-21: qty 2

## 2021-01-21 MED ORDER — PALONOSETRON HCL INJECTION 0.25 MG/5ML
INTRAVENOUS | Status: AC
  Filled 2021-01-21: qty 5

## 2021-01-21 MED ORDER — PEGFILGRASTIM 6 MG/0.6ML ~~LOC~~ PSKT
PREFILLED_SYRINGE | SUBCUTANEOUS | Status: AC
  Filled 2021-01-21: qty 0.6

## 2021-01-21 MED ORDER — ACETAMINOPHEN 325 MG PO TABS
650.0000 mg | ORAL_TABLET | Freq: Once | ORAL | Status: AC
  Administered 2021-01-21: 650 mg via ORAL

## 2021-01-21 MED ORDER — DIPHENHYDRAMINE HCL 25 MG PO CAPS
ORAL_CAPSULE | ORAL | Status: AC
  Filled 2021-01-21: qty 2

## 2021-01-21 MED ORDER — PREDNISONE 20 MG PO TABS: 60.0000 mg | ORAL_TABLET | ORAL | 5 refills | Status: DC

## 2021-01-21 MED ORDER — PANTOPRAZOLE SODIUM 40 MG PO TBEC: 40.0000 mg | DELAYED_RELEASE_TABLET | Freq: Every day | ORAL | 1 refills | Status: DC

## 2021-01-21 MED ORDER — HEPARIN SOD (PORK) LOCK FLUSH 100 UNIT/ML IV SOLN
500.0000 [IU] | Freq: Once | INTRAVENOUS | Status: AC | PRN
  Administered 2021-01-21: 500 [IU]
  Filled 2021-01-21: qty 5

## 2021-01-21 NOTE — Patient Instructions (Signed)

## 2021-01-21 NOTE — Patient Instructions (Signed)
[facility]  Discharge Instructions: Thank you for choosing [facility] to provide your oncology and hematology care.   If you have a lab appointment with the [facility], please go directly to the [facility] and check in at the registration area.   Wear comfortable clothing and clothing appropriate for easy access to any Portacath or PICC line.   We strive to give you quality time with your provider. You may need to reschedule your appointment if you arrive late (15 or more minutes).  Arriving late affects you and other patients whose appointments are after yours.  Also, if you miss three or more appointments without notifying the office, you may be dismissed from the clinic at the provider's discretion.      For prescription refill requests, have your pharmacy contact our office and allow 72 hours for refills to be completed.    Today you received the following chemotherapy and/or immunotherapy agents: doxorubicin/vincristine/cyclophosphamide/rituximab.      To help prevent nausea and vomiting after your treatment, we encourage you to take your nausea medication as directed.  BELOW ARE SYMPTOMS THAT SHOULD BE REPORTED IMMEDIATELY: . *FEVER GREATER THAN 100.4 F (38 C) OR HIGHER . *CHILLS OR SWEATING . *NAUSEA AND VOMITING THAT IS NOT CONTROLLED WITH YOUR NAUSEA MEDICATION . *UNUSUAL SHORTNESS OF BREATH . *UNUSUAL BRUISING OR BLEEDING . *URINARY PROBLEMS (pain or burning when urinating, or frequent urination) . *BOWEL PROBLEMS (unusual diarrhea, constipation, pain near the anus) . TENDERNESS IN MOUTH AND THROAT WITH OR WITHOUT PRESENCE OF ULCERS (sore throat, sores in mouth, or a toothache) . UNUSUAL RASH, SWELLING OR PAIN  . UNUSUAL VAGINAL DISCHARGE OR ITCHING   Items with * indicate a potential emergency and should be followed up as soon as possible or go to the Emergency Department if any problems should occur.  Please show the  CHEMOTHERAPY ALERT CARD or IMMUNOTHERAPY ALERT CARD at check-in to the Emergency Department and triage nurse.  Should you have questions after your visit or need to cancel or reschedule your appointment, please contact [facility]  Dept: [phone]  and follow the prompts.  Office hours are 8:00 a.m. to 4:30 p.m. Monday - Friday. Please note that voicemails left after 4:00 p.m. may not be returned until the following business day.  We are closed weekends and major holidays. You have access to a nurse at all times for urgent questions. Please call the main number to the clinic Dept: [phone] and follow the prompts.   For any non-urgent questions, you may also contact your provider using MyChart. We now offer e-Visits for anyone 37 and older to request care online for non-urgent symptoms. For details visit [email or web address].   Also download the MyChart app! Go to the app store, search "MyChart", open the app, select [facility], and log in with your MyChart username and password.  Due to Covid, a mask is required upon entering the hospital/clinic. If you do not have a mask, one will be given to you upon arrival. For doctor visits, patients may have 1 support person aged 58 or older with them. For treatment visits, patients cannot have anyone with them due to current Covid guidelines and our immunocompromised population.   Doxorubicin injection What is this medicine? DOXORUBICIN (dox oh ROO bi sin) is a chemotherapy drug. It is used to treat many kinds of cancer like leukemia, lymphoma, neuroblastoma, sarcoma, and Wilms' tumor. It is also used to treat  bladder cancer, breast cancer, lung cancer, ovarian cancer, stomach cancer, and thyroid cancer. This medicine may be used for other purposes; ask your health care provider or pharmacist if you have questions. COMMON BRAND NAME(S): Adriamycin, Adriamycin PFS, Adriamycin RDF, Rubex What should I tell my health care  provider before I take this medicine? They need to know if you have any of these conditions:  heart disease  history of low blood counts caused by a medicine  liver disease  recent or ongoing radiation therapy  an unusual or allergic reaction to doxorubicin, other chemotherapy agents, other medicines, foods, dyes, or preservatives  pregnant or trying to get pregnant  breast-feeding How should I use this medicine? This drug is given as an infusion into a vein. It is administered in a hospital or clinic by a specially trained health care professional. If you have pain, swelling, burning or any unusual feeling around the site of your injection, tell your health care professional right away. Talk to your pediatrician regarding the use of this medicine in children. Special care may be needed. Overdosage: If you think you have taken too much of this medicine contact a poison control center or emergency room at once. NOTE: This medicine is only for you. Do not share this medicine with others. What if I miss a dose? It is important not to miss your dose. Call your doctor or health care professional if you are unable to keep an appointment. What may interact with this medicine? This medicine may interact with the following medications:  6-mercaptopurine  paclitaxel  phenytoin  St. John's Wort  trastuzumab  verapamil This list may not describe all possible interactions. Give your health care provider a list of all the medicines, herbs, non-prescription drugs, or dietary supplements you use. Also tell them if you smoke, drink alcohol, or use illegal drugs. Some items may interact with your medicine. What should I watch for while using this medicine? This drug may make you feel generally unwell. This is not uncommon, as chemotherapy can affect healthy cells as well as cancer cells. Report any side effects. Continue your course of treatment even though you feel ill unless your doctor tells  you to stop. There is a maximum amount of this medicine you should receive throughout your life. The amount depends on the medical condition being treated and your overall health. Your doctor will watch how much of this medicine you receive in your lifetime. Tell your doctor if you have taken this medicine before. You may need blood work done while you are taking this medicine. Your urine may turn red for a few days after your dose. This is not blood. If your urine is dark or brown, call your doctor. In some cases, you may be given additional medicines to help with side effects. Follow all directions for their use. Call your doctor or health care professional for advice if you get a fever, chills or sore throat, or other symptoms of a cold or flu. Do not treat yourself. This drug decreases your body's ability to fight infections. Try to avoid being around people who are sick. This medicine may increase your risk to bruise or bleed. Call your doctor or health care professional if you notice any unusual bleeding. Talk to your doctor about your risk of cancer. You may be more at risk for certain types of cancers if you take this medicine. Do not become pregnant while taking this medicine or for 6 months after stopping it. Women should  inform their doctor if they wish to become pregnant or think they might be pregnant. Men should not father a child while taking this medicine and for 6 months after stopping it. There is a potential for serious side effects to an unborn child. Talk to your health care professional or pharmacist for more information. Do not breast-feed an infant while taking this medicine. This medicine has caused ovarian failure in some women and reduced sperm counts in some men This medicine may interfere with the ability to have a child. Talk with your doctor or health care professional if you are concerned about your fertility. This medicine may cause a decrease in Co-Enzyme Q-10. You should  make sure that you get enough Co-Enzyme Q-10 while you are taking this medicine. Discuss the foods you eat and the vitamins you take with your health care professional. What side effects may I notice from receiving this medicine? Side effects that you should report to your doctor or health care professional as soon as possible:  allergic reactions like skin rash, itching or hives, swelling of the face, lips, or tongue  breathing problems  chest pain  fast or irregular heartbeat  low blood counts - this medicine may decrease the number of white blood cells, red blood cells and platelets. You may be at increased risk for infections and bleeding.  pain, redness, or irritation at site where injected  signs of infection - fever or chills, cough, sore throat, pain or difficulty passing urine  signs of decreased platelets or bleeding - bruising, pinpoint red spots on the skin, black, tarry stools, blood in the urine  swelling of the ankles, feet, hands  tiredness  weakness Side effects that usually do not require medical attention (report to your doctor or health care professional if they continue or are bothersome):  diarrhea  hair loss  mouth sores  nail discoloration or damage  nausea  red colored urine  vomiting This list may not describe all possible side effects. Call your doctor for medical advice about side effects. You may report side effects to FDA at 1-800-FDA-1088. Where should I keep my medicine? This drug is given in a hospital or clinic and will not be stored at home. NOTE: This sheet is a summary. It may not cover all possible information. If you have questions about this medicine, talk to your doctor, pharmacist, or health care provider.  2021 Elsevier/Gold Standard (2017-04-29 11:01:26)  Vincristine injection What is this medicine? VINCRISTINE (vin KRIS teen) is a chemotherapy drug. It slows the growth of cancer cells. This medicine is used to treat many  types of cancer like Hodgkin's disease, leukemia, non-Hodgkin's lymphoma, neuroblastoma (brain cancer), rhabdomyosarcoma, and Wilms' tumor. This medicine may be used for other purposes; ask your health care provider or pharmacist if you have questions. COMMON BRAND NAME(S): Oncovin, Vincasar PFS What should I tell my health care provider before I take this medicine? They need to know if you have any of these conditions:  blood disorders  gout  infection (especially chickenpox, cold sores, or herpes)  kidney disease  liver disease  lung disease  nervous system disease like Charcot-Marie-Tooth (CMT)  recent or ongoing radiation therapy  an unusual or allergic reaction to vincristine, other chemotherapy agents, other medicines, foods, dyes, or preservatives  pregnant or trying to get pregnant  breast-feeding How should I use this medicine? This drug is given as an infusion into a vein. It is administered in a hospital or clinic by a specially  trained health care professional. If you have pain, swelling, burning, or any unusual feeling around the site of your injection, tell your health care professional right away. Talk to your pediatrician regarding the use of this medicine in children. While this drug may be prescribed for selected conditions, precautions do apply. Overdosage: If you think you have taken too much of this medicine contact a poison control center or emergency room at once. NOTE: This medicine is only for you. Do not share this medicine with others. What if I miss a dose? It is important not to miss your dose. Call your doctor or health care professional if you are unable to keep an appointment. What may interact with this medicine?  certain medicines for fungal infections like itraconazole, ketoconazole, posaconazole, voriconazole  certain medicines for seizures like phenytoin This list may not describe all possible interactions. Give your health care provider a  list of all the medicines, herbs, non-prescription drugs, or dietary supplements you use. Also tell them if you smoke, drink alcohol, or use illegal drugs. Some items may interact with your medicine. What should I watch for while using this medicine? This drug may make you feel generally unwell. This is not uncommon, as chemotherapy can affect healthy cells as well as cancer cells. Report any side effects. Continue your course of treatment even though you feel ill unless your doctor tells you to stop. You may need blood work done while you are taking this medicine. This medicine will cause constipation. Try to have a bowel movement at least every 2 to 3 days. If you do not have a bowel movement for 3 days, call your doctor or health care professional. In some cases, you may be given additional medicines to help with side effects. Follow all directions for their use. Do not become pregnant while taking this medicine. Women should inform their doctor if they wish to become pregnant or think they might be pregnant. There is a potential for serious side effects to an unborn child. Talk to your health care professional or pharmacist for more information. Do not breast-feed an infant while taking this medicine. This medicine may make it more difficult to get pregnant or to father a child. Talk to your healthcare professional if you are concerned about your fertility. What side effects may I notice from receiving this medicine? Side effects that you should report to your doctor or health care professional as soon as possible:  allergic reactions like skin rash, itching or hives, swelling of the face, lips, or tongue  breathing problems  confusion or changes in emotions or moods  constipation  cough  mouth sores  muscle weakness  nausea and vomiting  pain, swelling, redness or irritation at the injection site  pain, tingling, numbness in the hands or feet  problems with balance, talking,  walking  seizures  stomach pain  trouble passing urine or change in the amount of urine Side effects that usually do not require medical attention (report to your doctor or health care professional if they continue or are bothersome):  diarrhea  hair loss  jaw pain  loss of appetite This list may not describe all possible side effects. Call your doctor for medical advice about side effects. You may report side effects to FDA at 1-800-FDA-1088. Where should I keep my medicine? This drug is given in a hospital or clinic and will not be stored at home. NOTE: This sheet is a summary. It may not cover all possible information. If  you have questions about this medicine, talk to your doctor, pharmacist, or health care provider.  2021 Elsevier/Gold Standard (2019-08-16 17:05:13)  Cyclophosphamide Injection What is this medicine? CYCLOPHOSPHAMIDE (sye kloe FOSS fa mide) is a chemotherapy drug. It slows the growth of cancer cells. This medicine is used to treat many types of cancer like lymphoma, myeloma, leukemia, breast cancer, and ovarian cancer, to name a few. This medicine may be used for other purposes; ask your health care provider or pharmacist if you have questions. COMMON BRAND NAME(S): Cytoxan, Neosar What should I tell my health care provider before I take this medicine? They need to know if you have any of these conditions:  heart disease  history of irregular heartbeat  infection  kidney disease  liver disease  low blood counts, like white cells, platelets, or red blood cells  on hemodialysis  recent or ongoing radiation therapy  scarring or thickening of the lungs  trouble passing urine  an unusual or allergic reaction to cyclophosphamide, other medicines, foods, dyes, or preservatives  pregnant or trying to get pregnant  breast-feeding How should I use this medicine? This drug is usually given as an injection into a vein or muscle or by infusion into a  vein. It is administered in a hospital or clinic by a specially trained health care professional. Talk to your pediatrician regarding the use of this medicine in children. Special care may be needed. Overdosage: If you think you have taken too much of this medicine contact a poison control center or emergency room at once. NOTE: This medicine is only for you. Do not share this medicine with others. What if I miss a dose? It is important not to miss your dose. Call your doctor or health care professional if you are unable to keep an appointment. What may interact with this medicine?  amphotericin B  azathioprine  certain antivirals for HIV or hepatitis  certain medicines for blood pressure, heart disease, irregular heart beat  certain medicines that treat or prevent blood clots like warfarin  certain other medicines for cancer  cyclosporine  etanercept  indomethacin  medicines that relax muscles for surgery  medicines to increase blood counts  metronidazole This list may not describe all possible interactions. Give your health care provider a list of all the medicines, herbs, non-prescription drugs, or dietary supplements you use. Also tell them if you smoke, drink alcohol, or use illegal drugs. Some items may interact with your medicine. What should I watch for while using this medicine? Your condition will be monitored carefully while you are receiving this medicine. You may need blood work done while you are taking this medicine. Drink water or other fluids as directed. Urinate often, even at night. Some products may contain alcohol. Ask your health care professional if this medicine contains alcohol. Be sure to tell all health care professionals you are taking this medicine. Certain medicines, like metronidazole and disulfiram, can cause an unpleasant reaction when taken with alcohol. The reaction includes flushing, headache, nausea, vomiting, sweating, and increased thirst.  The reaction can last from 30 minutes to several hours. Do not become pregnant while taking this medicine or for 1 year after stopping it. Women should inform their health care professional if they wish to become pregnant or think they might be pregnant. Men should not father a child while taking this medicine and for 4 months after stopping it. There is potential for serious side effects to an unborn child. Talk to your health care  professional for more information. Do not breast-feed an infant while taking this medicine or for 1 week after stopping it. This medicine has caused ovarian failure in some women. This medicine may make it more difficult to get pregnant. Talk to your health care professional if you are concerned about your fertility. This medicine has caused decreased sperm counts in some men. This may make it more difficult to father a child. Talk to your health care professional if you are concerned about your fertility. Call your health care professional for advice if you get a fever, chills, or sore throat, or other symptoms of a cold or flu. Do not treat yourself. This medicine decreases your body's ability to fight infections. Try to avoid being around people who are sick. Avoid taking medicines that contain aspirin, acetaminophen, ibuprofen, naproxen, or ketoprofen unless instructed by your health care professional. These medicines may hide a fever. Talk to your health care professional about your risk of cancer. You may be more at risk for certain types of cancer if you take this medicine. If you are going to need surgery or other procedure, tell your health care professional that you are using this medicine. Be careful brushing or flossing your teeth or using a toothpick because you may get an infection or bleed more easily. If you have any dental work done, tell your dentist you are receiving this medicine. What side effects may I notice from receiving this medicine? Side effects  that you should report to your doctor or health care professional as soon as possible:  allergic reactions like skin rash, itching or hives, swelling of the face, lips, or tongue  breathing problems  nausea, vomiting  signs and symptoms of bleeding such as bloody or black, tarry stools; red or dark brown urine; spitting up blood or brown material that looks like coffee grounds; red spots on the skin; unusual bruising or bleeding from the eyes, gums, or nose  signs and symptoms of heart failure like fast, irregular heartbeat, sudden weight gain; swelling of the ankles, feet, hands  signs and symptoms of infection like fever; chills; cough; sore throat; pain or trouble passing urine  signs and symptoms of kidney injury like trouble passing urine or change in the amount of urine  signs and symptoms of liver injury like dark yellow or brown urine; general ill feeling or flu-like symptoms; light-colored stools; loss of appetite; nausea; right upper belly pain; unusually weak or tired; yellowing of the eyes or skin Side effects that usually do not require medical attention (report to your doctor or health care professional if they continue or are bothersome):  confusion  decreased hearing  diarrhea  facial flushing  hair loss  headache  loss of appetite  missed menstrual periods  signs and symptoms of low red blood cells or anemia such as unusually weak or tired; feeling faint or lightheaded; falls  skin discoloration This list may not describe all possible side effects. Call your doctor for medical advice about side effects. You may report side effects to FDA at 1-800-FDA-1088. Where should I keep my medicine? This drug is given in a hospital or clinic and will not be stored at home. NOTE: This sheet is a summary. It may not cover all possible information. If you have questions about this medicine, talk to your doctor, pharmacist, or health care provider.  2021 Elsevier/Gold  Standard (2019-06-20 09:53:29)  Rituximab Injection What is this medicine? RITUXIMAB (ri TUX i mab) is a monoclonal antibody. It  is used to treat certain types of cancer like non-Hodgkin lymphoma and chronic lymphocytic leukemia. It is also used to treat rheumatoid arthritis, granulomatosis with polyangiitis, microscopic polyangiitis, and pemphigus vulgaris. This medicine may be used for other purposes; ask your health care provider or pharmacist if you have questions. COMMON BRAND NAME(S): RIABNI, Rituxan, RUXIENCE What should I tell my health care provider before I take this medicine? They need to know if you have any of these conditions:  chest pain  heart disease  infection especially a viral infection such as chickenpox, cold sores, hepatitis B, or herpes  immune system problems  irregular heartbeat or rhythm  kidney disease  low blood counts (white cells, platelets, or red cells)  lung disease  recent or upcoming vaccine  an unusual or allergic reaction to rituximab, other medicines, foods, dyes, or preservatives  pregnant or trying to get pregnant  breast-feeding How should I use this medicine? This medicine is injected into a vein. It is given by a health care provider in a hospital or clinic setting. A special MedGuide will be given to you before each treatment. Be sure to read this information carefully each time. Talk to your health care provider about the use of this medicine in children. While this drug may be prescribed for children as young as 2 years for selected conditions, precautions do apply. Overdosage: If you think you have taken too much of this medicine contact a poison control center or emergency room at once. NOTE: This medicine is only for you. Do not share this medicine with others. What if I miss a dose? Keep appointments for follow-up doses. It is important not to miss your dose. Call your health care provider if you are unable to keep an  appointment. What may interact with this medicine? Do not take this medicine with any of the following medicines:  live vaccines This medicine may also interact with the following medicines:  cisplatin This list may not describe all possible interactions. Give your health care provider a list of all the medicines, herbs, non-prescription drugs, or dietary supplements you use. Also tell them if you smoke, drink alcohol, or use illegal drugs. Some items may interact with your medicine. What should I watch for while using this medicine? Your condition will be monitored carefully while you are receiving this medicine. You may need blood work done while you are taking this medicine. This medicine can cause serious infusion reactions. To reduce the risk your health care provider may give you other medicines to take before receiving this one. Be sure to follow the directions from your health care provider. This medicine may increase your risk of getting an infection. Call your health care provider for advice if you get a fever, chills, sore throat, or other symptoms of a cold or flu. Do not treat yourself. Try to avoid being around people who are sick. Call your health care provider if you are around anyone with measles, chickenpox, or if you develop sores or blisters that do not heal properly. Avoid taking medicines that contain aspirin, acetaminophen, ibuprofen, naproxen, or ketoprofen unless instructed by your health care provider. These medicines may hide a fever. This medicine may cause serious skin reactions. They can happen weeks to months after starting the medicine. Contact your health care provider right away if you notice fevers or flu-like symptoms with a rash. The rash may be red or purple and then turn into blisters or peeling of the skin. Or, you might notice  a red rash with swelling of the face, lips or lymph nodes in your neck or under your arms. In some patients, this medicine may cause a  serious brain infection that may cause death. If you have any problems seeing, thinking, speaking, walking, or standing, tell your healthcare professional right away. If you cannot reach your healthcare professional, urgently seek other source of medical care. Do not become pregnant while taking this medicine or for at least 12 months after stopping it. Women should inform their health care provider if they wish to become pregnant or think they might be pregnant. There is potential for serious harm to an unborn child. Talk to your health care provider for more information. Women should use a reliable form of birth control while taking this medicine and for 12 months after stopping it. Do not breast-feed while taking this medicine or for at least 6 months after stopping it. What side effects may I notice from receiving this medicine? Side effects that you should report to your health care provider as soon as possible:  allergic reactions (skin rash, itching or hives; swelling of the face, lips, or tongue)  diarrhea  edema (sudden weight gain; swelling of the ankles, feet, hands or other unusual swelling; trouble breathing)  fast, irregular heartbeat  heart attack (trouble breathing; pain or tightness in the chest, neck, back or arms; unusually weak or tired)  infection (fever, chills, cough, sore throat, pain or trouble passing urine)  kidney injury (trouble passing urine or change in the amount of urine)  liver injury (dark yellow or brown urine; general ill feeling or flu-like symptoms; loss of appetite, right upper belly pain; unusually weak or tired, yellowing of the eyes or skin)  low blood pressure (dizziness; feeling faint or lightheaded, falls; unusually weak or tired)  low red blood cell counts (trouble breathing; feeling faint; lightheaded, falls; unusually weak or tired)  mouth sores  redness, blistering, peeling, or loosening of the skin, including inside the mouth  stomach  pain  unusual bruising or bleeding  wheezing (trouble breathing with loud or whistling sounds)  vomiting Side effects that usually do not require medical attention (report to your health care provider if they continue or are bothersome):  headache  joint pain  muscle cramps, pain  nausea This list may not describe all possible side effects. Call your doctor for medical advice about side effects. You may report side effects to FDA at 1-800-FDA-1088. Where should I keep my medicine? This medicine is given in a hospital or clinic. It will not be stored at home. NOTE: This sheet is a summary. It may not cover all possible information. If you have questions about this medicine, talk to your doctor, pharmacist, or health care provider.  2021 Elsevier/Gold Standard (2020-06-28 21:35:50)

## 2021-01-22 ENCOUNTER — Telehealth: Payer: Self-pay | Admitting: Hematology and Oncology

## 2021-01-22 ENCOUNTER — Encounter: Payer: Self-pay | Admitting: Hematology and Oncology

## 2021-01-22 ENCOUNTER — Telehealth: Payer: Self-pay | Admitting: *Deleted

## 2021-01-22 NOTE — Telephone Encounter (Signed)
Sch per 4/26 sch msg, Patient aware 

## 2021-01-23 ENCOUNTER — Encounter: Payer: Self-pay | Admitting: Hematology and Oncology

## 2021-01-23 ENCOUNTER — Telehealth: Payer: Self-pay | Admitting: Hematology and Oncology

## 2021-01-23 ENCOUNTER — Inpatient Hospital Stay: Payer: 59

## 2021-01-23 ENCOUNTER — Ambulatory Visit: Payer: 59

## 2021-01-23 NOTE — Progress Notes (Signed)
Called pt to introduce myself as his Arboriculturist, discuss copay assistance and the [employer].  Pt stated he has met his deductible so his treatment should be paid at 100% so copay assistance shouldn't be needed.  I informed him of the [employer] and the [employer] but pt stated he doesn't need that type of assistance right now so I will request for the registration staff give him my card in case he changes his mind and for any questions or concerns he may have in the future.

## 2021-01-23 NOTE — Telephone Encounter (Signed)
Scheduled per los. Called and left msg. Mailed printout  °

## 2021-01-28 ENCOUNTER — Other Ambulatory Visit: Payer: Self-pay | Admitting: *Deleted

## 2021-01-28 ENCOUNTER — Other Ambulatory Visit: Payer: Self-pay

## 2021-01-28 ENCOUNTER — Other Ambulatory Visit: Payer: 59

## 2021-01-28 ENCOUNTER — Telehealth: Payer: Self-pay | Admitting: *Deleted

## 2021-01-28 ENCOUNTER — Inpatient Hospital Stay: Payer: 59 | Attending: Hematology and Oncology

## 2021-01-28 DIAGNOSIS — C8335 Diffuse large B-cell lymphoma, lymph nodes of inguinal region and lower limb: Secondary | ICD-10-CM

## 2021-01-28 DIAGNOSIS — Z5111 Encounter for antineoplastic chemotherapy: Secondary | ICD-10-CM | POA: Diagnosis present

## 2021-01-28 DIAGNOSIS — Z800 Family history of malignant neoplasm of digestive organs: Secondary | ICD-10-CM | POA: Insufficient documentation

## 2021-01-28 DIAGNOSIS — Z5112 Encounter for antineoplastic immunotherapy: Secondary | ICD-10-CM | POA: Insufficient documentation

## 2021-01-28 DIAGNOSIS — Z5189 Encounter for other specified aftercare: Secondary | ICD-10-CM | POA: Insufficient documentation

## 2021-01-28 DIAGNOSIS — I10 Essential (primary) hypertension: Secondary | ICD-10-CM | POA: Insufficient documentation

## 2021-01-28 DIAGNOSIS — Z79899 Other long term (current) drug therapy: Secondary | ICD-10-CM | POA: Diagnosis not present

## 2021-01-28 DIAGNOSIS — C8338 Diffuse large B-cell lymphoma, lymph nodes of multiple sites: Secondary | ICD-10-CM | POA: Insufficient documentation

## 2021-01-28 DIAGNOSIS — Z7952 Long term (current) use of systemic steroids: Secondary | ICD-10-CM | POA: Diagnosis not present

## 2021-01-28 DIAGNOSIS — Z95828 Presence of other vascular implants and grafts: Secondary | ICD-10-CM

## 2021-01-28 LAB — URINALYSIS, COMPLETE (UACMP) WITH MICROSCOPIC
Bacteria, UA: NONE SEEN
Bilirubin Urine: NEGATIVE
Glucose, UA: NEGATIVE mg/dL
Ketones, ur: 5 mg/dL — AB
Leukocytes,Ua: NEGATIVE
Nitrite: NEGATIVE
Protein, ur: NEGATIVE mg/dL
Specific Gravity, Urine: 1.008 (ref 1.005–1.030)
pH: 7 (ref 5.0–8.0)

## 2021-01-28 LAB — CBC WITH DIFFERENTIAL (CANCER CENTER ONLY)
Abs Immature Granulocytes: 0 10*3/uL (ref 0.00–0.07)
Basophils Absolute: 0 10*3/uL (ref 0.0–0.1)
Basophils Relative: 2 %
Eosinophils Absolute: 0.1 10*3/uL (ref 0.0–0.5)
Eosinophils Relative: 10 %
HCT: 39.8 % (ref 39.0–52.0)
Hemoglobin: 13.6 g/dL (ref 13.0–17.0)
Immature Granulocytes: 0 %
Lymphocytes Relative: 78 %
Lymphs Abs: 0.5 10*3/uL — ABNORMAL LOW (ref 0.7–4.0)
MCH: 30 pg (ref 26.0–34.0)
MCHC: 34.2 g/dL (ref 30.0–36.0)
MCV: 87.9 fL (ref 80.0–100.0)
Monocytes Absolute: 0.1 10*3/uL (ref 0.1–1.0)
Monocytes Relative: 8 %
Neutro Abs: 0 10*3/uL — CL (ref 1.7–7.7)
Neutrophils Relative %: 2 %
Platelet Count: 64 10*3/uL — ABNORMAL LOW (ref 150–400)
RBC: 4.53 MIL/uL (ref 4.22–5.81)
RDW: 12.5 % (ref 11.5–15.5)
WBC Count: 0.7 10*3/uL — CL (ref 4.0–10.5)
nRBC: 0 % (ref 0.0–0.2)

## 2021-01-28 LAB — CMP (CANCER CENTER ONLY)
ALT: 9 U/L (ref 0–44)
AST: 10 U/L — ABNORMAL LOW (ref 15–41)
Albumin: 3.4 g/dL — ABNORMAL LOW (ref 3.5–5.0)
Alkaline Phosphatase: 74 U/L (ref 38–126)
Anion gap: 7 (ref 5–15)
BUN: 16 mg/dL (ref 6–20)
CO2: 27 mmol/L (ref 22–32)
Calcium: 8.6 mg/dL — ABNORMAL LOW (ref 8.9–10.3)
Chloride: 102 mmol/L (ref 98–111)
Creatinine: 0.96 mg/dL (ref 0.61–1.24)
GFR, Estimated: >60 mL/min (ref >60)
Glucose, Bld: 88 mg/dL (ref 70–99)
Potassium: 4.1 mmol/L (ref 3.5–5.1)
Sodium: 136 mmol/L (ref 135–145)
Total Bilirubin: 1.1 mg/dL (ref 0.3–1.2)
Total Protein: 6.1 g/dL — ABNORMAL LOW (ref 6.5–8.1)

## 2021-01-28 LAB — LACTATE DEHYDROGENASE: LDH: 123 U/L (ref 98–192)

## 2021-01-28 LAB — URIC ACID: Uric Acid, Serum: 3.6 mg/dL — ABNORMAL LOW (ref 3.7–8.6)

## 2021-01-28 MED ORDER — HEPARIN SOD (PORK) LOCK FLUSH 100 UNIT/ML IV SOLN
500.0000 [IU] | Freq: Once | INTRAVENOUS | Status: AC
  Administered 2021-01-28: 500 [IU] via INTRAVENOUS
  Filled 2021-01-28: qty 5

## 2021-01-28 MED ORDER — SODIUM CHLORIDE 0.9% FLUSH
10.0000 mL | Freq: Once | INTRAVENOUS | Status: AC
Start: 2021-01-28 — End: 2021-01-28
  Administered 2021-01-28: 10 mL via INTRAVENOUS
  Filled 2021-01-28: qty 10

## 2021-01-28 NOTE — Telephone Encounter (Signed)
TCT patient's wife, Lattie Haw. Spoke with her and advised that [facility]'s lab look good excpet for  A drop in his WBC's and platelets.  WBC is 0.7, platelets are 64k.  Advised u/a is negative for UTI though +for blood-microscopic, Reviewed neutropenic precautions. Advised to call back with temp100.4 or greater. Advised not to take Tylenol initially if he has a fever so we can track it.  Lattie Haw states he does not have a fever and feels ok overall.  Reminded of fluids-64 oz/day and increase protein in diet. She voiced understanding and is aware of repeat labs next Monday.

## 2021-01-28 NOTE — Telephone Encounter (Signed)
Received call from pt's wife. She states he has been voiding about every 15-30 minutes over the weekend with the increase in his fluid intake, even at night. Voiding large amounts, no burning, urine is clear, no pain, denies fever. Advised that I will add u/a and culture to labs today. Advised u/o is increased due to his increased fluid intake. Advised that he should try and maintain about 64 oz/per day. He had been drinking up to 100 oz per day.  Advised he does not need to drink that much and he hen slow down his fluids in the late afternoon and hopefully he won't be up every 15-30 minutes at night.

## 2021-01-28 NOTE — Progress Notes (Signed)
No orders.

## 2021-01-29 LAB — URINE CULTURE: Culture: NO GROWTH

## 2021-02-04 ENCOUNTER — Inpatient Hospital Stay: Payer: 59

## 2021-02-04 ENCOUNTER — Other Ambulatory Visit: Payer: 59

## 2021-02-04 ENCOUNTER — Telehealth: Payer: Self-pay | Admitting: *Deleted

## 2021-02-04 ENCOUNTER — Other Ambulatory Visit: Payer: Self-pay

## 2021-02-04 DIAGNOSIS — Z95828 Presence of other vascular implants and grafts: Secondary | ICD-10-CM

## 2021-02-04 DIAGNOSIS — Z5112 Encounter for antineoplastic immunotherapy: Secondary | ICD-10-CM | POA: Diagnosis not present

## 2021-02-04 DIAGNOSIS — C8335 Diffuse large B-cell lymphoma, lymph nodes of inguinal region and lower limb: Secondary | ICD-10-CM

## 2021-02-04 LAB — CMP (CANCER CENTER ONLY)
ALT: 16 U/L (ref 0–44)
AST: 14 U/L — ABNORMAL LOW (ref 15–41)
Albumin: 3.3 g/dL — ABNORMAL LOW (ref 3.5–5.0)
Alkaline Phosphatase: 74 U/L (ref 38–126)
Anion gap: 9 (ref 5–15)
BUN: 11 mg/dL (ref 6–20)
CO2: 25 mmol/L (ref 22–32)
Calcium: 8.8 mg/dL — ABNORMAL LOW (ref 8.9–10.3)
Chloride: 107 mmol/L (ref 98–111)
Creatinine: 1.21 mg/dL (ref 0.61–1.24)
GFR, Estimated: >60 mL/min (ref >60)
Glucose, Bld: 138 mg/dL — ABNORMAL HIGH (ref 70–99)
Potassium: 3.8 mmol/L (ref 3.5–5.1)
Sodium: 141 mmol/L (ref 135–145)
Total Bilirubin: 0.3 mg/dL (ref 0.3–1.2)
Total Protein: 6.2 g/dL — ABNORMAL LOW (ref 6.5–8.1)

## 2021-02-04 LAB — CBC WITH DIFFERENTIAL (CANCER CENTER ONLY)
Abs Immature Granulocytes: 0.23 10*3/uL — ABNORMAL HIGH (ref 0.00–0.07)
Basophils Absolute: 0 10*3/uL (ref 0.0–0.1)
Basophils Relative: 0 %
Eosinophils Absolute: 0 10*3/uL (ref 0.0–0.5)
Eosinophils Relative: 0 %
HCT: 38.9 % — ABNORMAL LOW (ref 39.0–52.0)
Hemoglobin: 13.2 g/dL (ref 13.0–17.0)
Immature Granulocytes: 7 %
Lymphocytes Relative: 24 %
Lymphs Abs: 0.9 10*3/uL (ref 0.7–4.0)
MCH: 30.1 pg (ref 26.0–34.0)
MCHC: 33.9 g/dL (ref 30.0–36.0)
MCV: 88.8 fL (ref 80.0–100.0)
Monocytes Absolute: 0.3 10*3/uL (ref 0.1–1.0)
Monocytes Relative: 8 %
Neutro Abs: 2.2 10*3/uL (ref 1.7–7.7)
Neutrophils Relative %: 61 %
Platelet Count: 192 10*3/uL (ref 150–400)
RBC: 4.38 MIL/uL (ref 4.22–5.81)
RDW: 12.9 % (ref 11.5–15.5)
WBC Count: 3.5 10*3/uL — ABNORMAL LOW (ref 4.0–10.5)
nRBC: 0 % (ref 0.0–0.2)

## 2021-02-04 LAB — LACTATE DEHYDROGENASE: LDH: 175 U/L (ref 98–192)

## 2021-02-04 LAB — URIC ACID: Uric Acid, Serum: 3.6 mg/dL — ABNORMAL LOW (ref 3.7–8.6)

## 2021-02-04 MED ORDER — HEPARIN SOD (PORK) LOCK FLUSH 100 UNIT/ML IV SOLN
500.0000 [IU] | Freq: Once | INTRAVENOUS | Status: AC
Start: 2021-02-04 — End: 2021-02-04
  Administered 2021-02-04: 500 [IU] via INTRAVENOUS
  Filled 2021-02-04: qty 5

## 2021-02-04 MED ORDER — SODIUM CHLORIDE 0.9% FLUSH
10.0000 mL | INTRAVENOUS | Status: DC | PRN
  Administered 2021-02-04: 10 mL via INTRAVENOUS
  Filled 2021-02-04: qty 10

## 2021-02-04 NOTE — Patient Instructions (Signed)
Implanted Port Insertion, Care After This sheet gives you information about how to care for yourself after your procedure. Your health care provider may also give you more specific instructions. If you have problems or questions, contact your health care provider. What can I expect after the procedure? After the procedure, it is common to have:  Discomfort at the port insertion site.  Bruising on the skin over the port. This should improve over 3-4 days. Follow these instructions at home: Port care  After your port is placed, you will get a manufacturer's information card. The card has information about your port. Keep this card with you at all times.  Take care of the port as told by your health care provider. Ask your health care provider if you or a family member can get training for taking care of the port at home. A home health care nurse may also take care of the port.  Make sure to remember what type of port you have. Incision care  Follow instructions from your health care provider about how to take care of your port insertion site. Make sure you: ? Wash your hands with soap and water before and after you change your bandage (dressing). If soap and water are not available, use hand sanitizer. ? Change your dressing as told by your health care provider. ? Leave stitches (sutures), skin glue, or adhesive strips in place. These skin closures may need to stay in place for 2 weeks or longer. If adhesive strip edges start to loosen and curl up, you may trim the loose edges. Do not remove adhesive strips completely unless your health care provider tells you to do that.  Check your port insertion site every day for signs of infection. Check for: ? Redness, swelling, or pain. ? Fluid or blood. ? Warmth. ? Pus or a bad smell.      Activity  Return to your normal activities as told by your health care provider. Ask your health care provider what activities are safe for you.  Do not  lift anything that is heavier than 10 lb (4.5 kg), or the limit that you are told, until your health care provider says that it is safe. General instructions  Take over-the-counter and prescription medicines only as told by your health care provider.  Do not take baths, swim, or use a hot tub until your health care provider approves. Ask your health care provider if you may take showers. You may only be allowed to take sponge baths.  Do not drive for 24 hours if you were given a sedative during your procedure.  Wear a medical alert bracelet in case of an emergency. This will tell any health care providers that you have a port.  Keep all follow-up visits as told by your health care provider. This is important. Contact a health care provider if:  You cannot flush your port with saline as directed, or you cannot draw blood from the port.  You have a fever or chills.  You have redness, swelling, or pain around your port insertion site.  You have fluid or blood coming from your port insertion site.  Your port insertion site feels warm to the touch.  You have pus or a bad smell coming from the port insertion site. Get help right away if:  You have chest pain or shortness of breath.  You have bleeding from your port that you cannot control. Summary  Take care of the port as told by your   health care provider. Keep the manufacturer's information card with you at all times.  Change your dressing as told by your health care provider.  Contact a health care provider if you have a fever or chills or if you have redness, swelling, or pain around your port insertion site.  Keep all follow-up visits as told by your health care provider. This information is not intended to replace advice given to you by your health care provider. Make sure you discuss any questions you have with your health care provider. Document Revised: 04/13/2018 Document Reviewed: 04/13/2018 Elsevier Patient Education   2021 Elsevier Inc.  

## 2021-02-04 NOTE — Telephone Encounter (Signed)
Patient's wife called to report that patients increase urine frequency has improved however, he is having an odd discomfort after urinating.  He just had UA completed.   Discussed with Dr Lorenso Courier and he advised that patient needs to increase fluid intake as his urine could be concentrated and causing the irritation.  Unlikely that bacteria related as UA from 5 days prior was clean.  Discussed with wife and no further questions at this time.

## 2021-02-07 ENCOUNTER — Other Ambulatory Visit: Payer: Self-pay | Admitting: Hematology and Oncology

## 2021-02-11 ENCOUNTER — Other Ambulatory Visit: Payer: 59

## 2021-02-11 ENCOUNTER — Inpatient Hospital Stay: Payer: 59

## 2021-02-11 ENCOUNTER — Encounter: Payer: Self-pay | Admitting: Hematology and Oncology

## 2021-02-11 ENCOUNTER — Other Ambulatory Visit: Payer: Self-pay

## 2021-02-11 ENCOUNTER — Inpatient Hospital Stay: Payer: 59 | Admitting: Hematology and Oncology

## 2021-02-11 VITALS — BP 129/78 | HR 87 | Temp 98.3°F | Resp 18

## 2021-02-11 VITALS — BP 154/106 | HR 85 | Temp 97.9°F | Resp 18 | Ht 75.0 in | Wt 271.0 lb

## 2021-02-11 DIAGNOSIS — Z5112 Encounter for antineoplastic immunotherapy: Secondary | ICD-10-CM | POA: Diagnosis not present

## 2021-02-11 DIAGNOSIS — C8335 Diffuse large B-cell lymphoma, lymph nodes of inguinal region and lower limb: Secondary | ICD-10-CM | POA: Diagnosis not present

## 2021-02-11 DIAGNOSIS — Z95828 Presence of other vascular implants and grafts: Secondary | ICD-10-CM | POA: Insufficient documentation

## 2021-02-11 LAB — CBC WITH DIFFERENTIAL (CANCER CENTER ONLY)
Abs Immature Granulocytes: 0.05 10*3/uL (ref 0.00–0.07)
Basophils Absolute: 0.1 10*3/uL (ref 0.0–0.1)
Basophils Relative: 2 %
Eosinophils Absolute: 0.1 10*3/uL (ref 0.0–0.5)
Eosinophils Relative: 2 %
HCT: 40.1 % (ref 39.0–52.0)
Hemoglobin: 13.8 g/dL (ref 13.0–17.0)
Immature Granulocytes: 1 %
Lymphocytes Relative: 11 %
Lymphs Abs: 0.6 10*3/uL — ABNORMAL LOW (ref 0.7–4.0)
MCH: 30.3 pg (ref 26.0–34.0)
MCHC: 34.4 g/dL (ref 30.0–36.0)
MCV: 88.1 fL (ref 80.0–100.0)
Monocytes Absolute: 0.2 10*3/uL (ref 0.1–1.0)
Monocytes Relative: 3 %
Neutro Abs: 4.3 10*3/uL (ref 1.7–7.7)
Neutrophils Relative %: 81 %
Platelet Count: 358 10*3/uL (ref 150–400)
RBC: 4.55 MIL/uL (ref 4.22–5.81)
RDW: 13.6 % (ref 11.5–15.5)
WBC Count: 5.3 10*3/uL (ref 4.0–10.5)
nRBC: 0 % (ref 0.0–0.2)

## 2021-02-11 LAB — CMP (CANCER CENTER ONLY)
ALT: 19 U/L (ref 0–44)
AST: 19 U/L (ref 15–41)
Albumin: 3.5 g/dL (ref 3.5–5.0)
Alkaline Phosphatase: 69 U/L (ref 38–126)
Anion gap: 6 (ref 5–15)
BUN: 11 mg/dL (ref 6–20)
CO2: 27 mmol/L (ref 22–32)
Calcium: 9.4 mg/dL (ref 8.9–10.3)
Chloride: 105 mmol/L (ref 98–111)
Creatinine: 1.18 mg/dL (ref 0.61–1.24)
GFR, Estimated: >60 mL/min (ref >60)
Glucose, Bld: 134 mg/dL — ABNORMAL HIGH (ref 70–99)
Potassium: 4.5 mmol/L (ref 3.5–5.1)
Sodium: 138 mmol/L (ref 135–145)
Total Bilirubin: 0.4 mg/dL (ref 0.3–1.2)
Total Protein: 6.4 g/dL — ABNORMAL LOW (ref 6.5–8.1)

## 2021-02-11 LAB — LACTATE DEHYDROGENASE: LDH: 190 U/L (ref 98–192)

## 2021-02-11 LAB — URIC ACID: Uric Acid, Serum: 3.3 mg/dL — ABNORMAL LOW (ref 3.7–8.6)

## 2021-02-11 MED ORDER — SODIUM CHLORIDE 0.9 % IV SOLN
750.0000 mg/m2 | Freq: Once | INTRAVENOUS | Status: AC
  Administered 2021-02-11: 1900 mg via INTRAVENOUS
  Filled 2021-02-11: qty 95

## 2021-02-11 MED ORDER — PEGFILGRASTIM 6 MG/0.6ML ~~LOC~~ PSKT
6.0000 mg | PREFILLED_SYRINGE | Freq: Once | SUBCUTANEOUS | Status: AC
  Administered 2021-02-11: 6 mg via SUBCUTANEOUS

## 2021-02-11 MED ORDER — ACETAMINOPHEN 325 MG PO TABS
650.0000 mg | ORAL_TABLET | Freq: Once | ORAL | Status: AC
  Administered 2021-02-11: 650 mg via ORAL

## 2021-02-11 MED ORDER — ACETAMINOPHEN 325 MG PO TABS
ORAL_TABLET | ORAL | Status: AC
  Filled 2021-02-11: qty 2

## 2021-02-11 MED ORDER — SODIUM CHLORIDE 0.9 % IV SOLN
10.0000 mg | Freq: Once | INTRAVENOUS | Status: AC
  Administered 2021-02-11: 10 mg via INTRAVENOUS
  Filled 2021-02-11: qty 10

## 2021-02-11 MED ORDER — DOXORUBICIN HCL CHEMO IV INJECTION 2 MG/ML
50.0000 mg/m2 | Freq: Once | INTRAVENOUS | Status: AC
  Administered 2021-02-11: 126 mg via INTRAVENOUS
  Filled 2021-02-11: qty 63

## 2021-02-11 MED ORDER — SODIUM CHLORIDE 0.9% FLUSH
10.0000 mL | INTRAVENOUS | Status: DC | PRN
  Administered 2021-02-11: 10 mL
  Filled 2021-02-11: qty 10

## 2021-02-11 MED ORDER — SODIUM CHLORIDE 0.9% FLUSH
10.0000 mL | Freq: Once | INTRAVENOUS | Status: AC
Start: 2021-02-11 — End: 2021-02-11
  Administered 2021-02-11: 10 mL
  Filled 2021-02-11: qty 10

## 2021-02-11 MED ORDER — PALONOSETRON HCL INJECTION 0.25 MG/5ML
INTRAVENOUS | Status: AC
  Filled 2021-02-11: qty 5

## 2021-02-11 MED ORDER — HEPARIN SOD (PORK) LOCK FLUSH 100 UNIT/ML IV SOLN
500.0000 [IU] | Freq: Once | INTRAVENOUS | Status: AC | PRN
  Administered 2021-02-11: 500 [IU]
  Filled 2021-02-11: qty 5

## 2021-02-11 MED ORDER — SODIUM CHLORIDE 0.9 % IV SOLN: 375.0000 mg/m2 | Freq: Once | INTRAVENOUS | Status: DC

## 2021-02-11 MED ORDER — DIPHENHYDRAMINE HCL 25 MG PO CAPS
50.0000 mg | ORAL_CAPSULE | Freq: Once | ORAL | Status: AC
  Administered 2021-02-11: 50 mg via ORAL

## 2021-02-11 MED ORDER — PEGFILGRASTIM 6 MG/0.6ML ~~LOC~~ PSKT
PREFILLED_SYRINGE | SUBCUTANEOUS | Status: AC
  Filled 2021-02-11: qty 0.6

## 2021-02-11 MED ORDER — PALONOSETRON HCL INJECTION 0.25 MG/5ML
0.2500 mg | Freq: Once | INTRAVENOUS | Status: AC
  Administered 2021-02-11: 0.25 mg via INTRAVENOUS

## 2021-02-11 MED ORDER — SODIUM CHLORIDE 0.9 % IV SOLN
150.0000 mg | Freq: Once | INTRAVENOUS | Status: AC
  Administered 2021-02-11: 150 mg via INTRAVENOUS
  Filled 2021-02-11: qty 150

## 2021-02-11 MED ORDER — SODIUM CHLORIDE 0.9 % IV SOLN
375.0000 mg/m2 | Freq: Once | INTRAVENOUS | Status: AC
Start: 2021-02-11 — End: 2021-02-11
  Administered 2021-02-11: 900 mg via INTRAVENOUS
  Filled 2021-02-11: qty 50

## 2021-02-11 MED ORDER — SODIUM CHLORIDE 0.9 % IV SOLN
Freq: Once | INTRAVENOUS | Status: AC
  Filled 2021-02-11: qty 250

## 2021-02-11 MED ORDER — DIPHENHYDRAMINE HCL 25 MG PO CAPS
ORAL_CAPSULE | ORAL | Status: AC
  Filled 2021-02-11: qty 2

## 2021-02-11 MED ORDER — VINCRISTINE SULFATE CHEMO INJECTION 1 MG/ML
2.0000 mg | Freq: Once | INTRAVENOUS | Status: AC
  Administered 2021-02-11: 2 mg via INTRAVENOUS
  Filled 2021-02-11: qty 2

## 2021-02-11 NOTE — Patient Instructions (Addendum)
[facility]  Discharge Instructions: Thank you for choosing [facility] to provide your oncology and hematology care.   If you have a lab appointment with the [facility], please go directly to the [facility] and check in at the registration area.   Wear comfortable clothing and clothing appropriate for easy access to any Portacath or PICC line.   We strive to give you quality time with your provider. You may need to reschedule your appointment if you arrive late (15 or more minutes).  Arriving late affects you and other patients whose appointments are after yours.  Also, if you miss three or more appointments without notifying the office, you may be dismissed from the clinic at the provider's discretion.      For prescription refill requests, have your pharmacy contact our office and allow 72 hours for refills to be completed.    Today you received the following chemotherapy and/or immunotherapy agents: doxorubicin/vincristine/cyclophosphamide/rituximab.      To help prevent nausea and vomiting after your treatment, we encourage you to take your nausea medication as directed.  BELOW ARE SYMPTOMS THAT SHOULD BE REPORTED IMMEDIATELY: . *FEVER GREATER THAN 100.4 F (38 C) OR HIGHER . *CHILLS OR SWEATING . *NAUSEA AND VOMITING THAT IS NOT CONTROLLED WITH YOUR NAUSEA MEDICATION . *UNUSUAL SHORTNESS OF BREATH . *UNUSUAL BRUISING OR BLEEDING . *URINARY PROBLEMS (pain or burning when urinating, or frequent urination) . *BOWEL PROBLEMS (unusual diarrhea, constipation, pain near the anus) . TENDERNESS IN MOUTH AND THROAT WITH OR WITHOUT PRESENCE OF ULCERS (sore throat, sores in mouth, or a toothache) . UNUSUAL RASH, SWELLING OR PAIN  . UNUSUAL VAGINAL DISCHARGE OR ITCHING   Items with * indicate a potential emergency and should be followed up as soon as possible or go to the Emergency Department if any problems should occur.  Please show the  CHEMOTHERAPY ALERT CARD or IMMUNOTHERAPY ALERT CARD at check-in to the Emergency Department and triage nurse.  Should you have questions after your visit or need to cancel or reschedule your appointment, please contact [facility]  Dept: [phone]  and follow the prompts.  Office hours are 8:00 a.m. to 4:30 p.m. Monday - Friday. Please note that voicemails left after 4:00 p.m. may not be returned until the following business day.  We are closed weekends and major holidays. You have access to a nurse at all times for urgent questions. Please call the main number to the clinic Dept: [phone] and follow the prompts.   For any non-urgent questions, you may also contact your provider using MyChart. We now offer e-Visits for anyone 16 and older to request care online for non-urgent symptoms. For details visit [email or web address].   Also download the MyChart app! Go to the app store, search "MyChart", open the app, select [facility], and log in with your MyChart username and password.  Due to Covid, a mask is required upon entering the hospital/clinic. If you do not have a mask, one will be given to you upon arrival. For doctor visits, patients may have 1 support person aged 25 or older with them. For treatment visits, patients cannot have anyone with them due to current Covid guidelines and our immunocompromised population.

## 2021-02-11 NOTE — Progress Notes (Signed)
Ok to treat w/ elevated BP per Dr. Lorenso Courier

## 2021-02-11 NOTE — Progress Notes (Signed)
[facility] [phone]   [phone]  PROGRESS NOTE  Patient Care Team: [employer], MD as PCP - General (Internal Medicine)  Hematological/Oncological History # Diffuse Large B Cell Lymphoma, Stage IV 12/17/2020: CT scan at [facility], results show left inguinal adenopathy and multiple solid splenic lesions probably of lymphoid origin  12/26/2020: excisional biopsy of left inguinal lymph node shows DLBCL.  01/04/2021: establish care with Dr. Lorenso Courier  01/09/2021: PET CT scan shows multiple hypermetabolic splenic lesions compatible with lymphoma.  There are also 2 hypermetabolic liver lesions also likely related to lymphoma.  No evidence of hypermetabolic activity in the neck, chest, abdomen or pelvis elsewhere. 01/21/2021: Cycle 1 Day 1 of R-CHOP chemotherapy 02/11/2021: Cycle 2 Day 1 of R-CHOP chemotherapy  Interval History:  Steve Smith 60 y.o. male with medical history significant for Stage IV DLBCL who presents for a follow up visit. The patient's last visit was on 01/21/2021. In the interim since the last visit Steve Smith has completed cycle 1 of treatment  On exam today Steve Smith is accompanied by his wife.  He reports that elevated cycle will chemotherapy surprisingly well.  He unfortunately has begun experiencing hair loss and therefore has shaved off all of his hair.  He reports that he has not been having any issues with nausea or vomiting.  He does have some occasional numbness and tingling in his fingers and he reports having a bowel movement at least every other day.  He reports his appetite is good and his weight has been increasing.  He notes that his energy is "so-so".  He notes that the first week of chemotherapy "knocked him down".  He reports that he suddenly got back to normal after that first week.  He did have a bout of urinating nearly every hour but was drinking excessive amounts of water and has since cut back down on that.  Is also not  having any sweats at this time he notes that otherwise his appetite has been good and he has been having no issues with nausea, vomiting, or diarrhea.  He has no focal infectious symptoms.  A full 10 point ROS is listed below.  MEDICAL HISTORY:  Past Medical History:  Diagnosis Date  . Basal cell carcinoma   . Hypertension   . Kidney stone     SURGICAL HISTORY: Past Surgical History:  Procedure Laterality Date  . INGUINAL LYMPH NODE BIOPSY Left 12/26/2020   Procedure: LEFT INGUINAL EXCISIONAL  LYMPH NODE BIOPSY;  Surgeon: Virl Cagey, MD;  Location: [facility];  Service: General;  Laterality: Left;  . IR IMAGING GUIDED PORT INSERTION  01/17/2021  . ROTATOR CUFF REPAIR     Left    SOCIAL HISTORY: Social History   Socioeconomic History  . Marital status: Married    Spouse name: Not on file  . Number of children: Not on file  . Years of education: Not on file  . Highest education level: Not on file  Occupational History  . Not on file  Tobacco Use  . Smoking status: Never Smoker  . Smokeless tobacco: Never Used  Substance and Sexual Activity  . Alcohol use: No  . Drug use: No  . Sexual activity: Not on file  Other Topics Concern  . Not on file  Social History Narrative  . Not on file   Social Determinants of Health   Financial Resource Strain: Not on file  Food Insecurity: Not on file  Transportation Needs: Not  on file  Physical Activity: Not on file  Stress: Not on file  Social Connections: Not on file  Intimate Partner Violence: Not on file    FAMILY HISTORY: Family History  Problem Relation Age of Onset  . Basal cell carcinoma Mother   . Liver cancer Father     ALLERGIES:  has No Known Allergies.  MEDICATIONS:  Current Outpatient Medications  Medication Sig Dispense Refill  . allopurinol (ZYLOPRIM) 300 MG tablet Take 1 tablet (300 mg total) by mouth daily. 90 tablet 1  . lidocaine-prilocaine (EMLA) cream Apply 1 application topically as needed. 30 g  0  . olmesartan (BENICAR) 20 MG tablet Take 40 mg by mouth daily.    . ondansetron (ZOFRAN) 4 MG tablet Take 1 tablet (4 mg total) by mouth every 8 (eight) hours as needed. 30 tablet 0  . pantoprazole (PROTONIX) 40 MG tablet Take 1 tablet (40 mg total) by mouth daily. 90 each 1  . predniSONE (DELTASONE) 20 MG tablet Take 3 tablets (60 mg total) by mouth as directed. Take 3 tablets in the morning on Day 1 of chemotherapy. Take 3 tablets in the morning for the next 4 days. 15 tablet 5  . prochlorperazine (COMPAZINE) 10 MG tablet Take 1 tablet (10 mg total) by mouth every 6 (six) hours as needed for nausea or vomiting. 30 tablet 0   No current facility-administered medications for this visit.    REVIEW OF SYSTEMS:   Constitutional: ( - ) fevers, ( - )  chills , ( - ) night sweats Eyes: ( - ) blurriness of vision, ( - ) double vision, ( - ) watery eyes Ears, nose, mouth, throat, and face: ( - ) mucositis, ( - ) sore throat Respiratory: ( - ) cough, ( - ) dyspnea, ( - ) wheezes Cardiovascular: ( - ) palpitation, ( - ) chest discomfort, ( - ) lower extremity swelling Gastrointestinal:  ( - ) nausea, ( - ) heartburn, ( - ) change in bowel habits Skin: ( - ) abnormal skin rashes Lymphatics: ( - ) new lymphadenopathy, ( - ) easy bruising Neurological: ( - ) numbness, ( - ) tingling, ( - ) new weaknesses Behavioral/Psych: ( - ) mood change, ( - ) new changes  All other systems were reviewed with the patient and are negative.  PHYSICAL EXAMINATION: ECOG PERFORMANCE STATUS: 1 - Symptomatic but completely ambulatory  Vitals:   02/11/21 0958  BP: (!) 154/106  Pulse: 85  Resp: 18  Temp: 97.9 F (36.6 C)  SpO2: 97%   Filed Weights   02/11/21 0958  Weight: 271 lb (122.9 kg)    GENERAL: well appearing middle aged Caucasian male. alert, no distress and comfortable SKIN: skin color, texture, turgor are normal, no rashes or significant lesions EYES: conjunctiva are pink and non-injected, sclera  clear LUNGS: clear to auscultation and percussion with normal breathing effort HEART: regular rate & rhythm and no murmurs and no lower extremity edema Musculoskeletal: no cyanosis of digits and no clubbing  PSYCH: alert & oriented x 3, fluent speech NEURO: no focal motor/sensory deficits  LABORATORY DATA:  I have reviewed the data as listed CBC Latest Ref Rng & Units 02/11/2021 02/04/2021 01/28/2021  WBC 4.0 - 10.5 K/uL 5.3 3.5(L) 0.7(LL)  Hemoglobin 13.0 - 17.0 g/dL 13.8 13.2 13.6  Hematocrit 39.0 - 52.0 % 40.1 38.9(L) 39.8  Platelets 150 - 400 K/uL 358 192 64(L)    CMP Latest Ref Rng & Units 02/04/2021 01/28/2021 01/21/2021  Glucose 70 - 99 mg/dL 138(H) 88 115(H)  BUN 6 - 20 mg/dL 11 16 17   Creatinine 0.61 - 1.24 mg/dL 1.21 0.96 1.28(H)  Sodium 135 - 145 mmol/L 141 136 140  Potassium 3.5 - 5.1 mmol/L 3.8 4.1 3.8  Chloride 98 - 111 mmol/L 107 102 102  CO2 22 - 32 mmol/L 25 27 27   Calcium 8.9 - 10.3 mg/dL 8.8(L) 8.6(L) 9.4  Total Protein 6.5 - 8.1 g/dL 6.2(L) 6.1(L) 7.0  Total Bilirubin 0.3 - 1.2 mg/dL 0.3 1.1 0.5  Alkaline Phos 38 - 126 U/L 74 74 90  AST 15 - 41 U/L 14(L) 10(L) 14(L)  ALT 0 - 44 U/L 16 9 13     RADIOGRAPHIC STUDIES: I have personally reviewed the radiological images as listed and agreed with the findings in the report: Hypermetabolic lesions in the spleen and liver consistent with metastatic disease.  ECHOCARDIOGRAM COMPLETE  Result Date: 01/15/2021    ECHOCARDIOGRAM REPORT   Patient Name:   Steve Smith Date of Exam: 01/15/2021 Medical Rec #:  [id number]      Height:       75.0 in Accession #:    [id number]     Weight:       267.0 lb Date of Birth:  05-Jun-1961      BSA:          2.481 m Patient Age:    13 years       BP:           185/99 mmHg Patient Gender: M              HR:           67 bpm. Exam Location:  Outpatient Procedure: 2D Echo, Color Doppler, Cardiac Doppler and Strain Analysis Indications:    Chemo Z09  History:        Patient has no prior history of  Echocardiogram examinations.                 Risk Factors:Hypertension.  Sonographer:    Bernadene Person RDCS Referring Phys: [id number] [place]  1. Left ventricular ejection fraction, by estimation, is 65 to 70%. The left ventricle has normal function. The left ventricle has no regional wall motion abnormalities. Left ventricular diastolic parameters were normal. The average left ventricular global longitudinal strain is -26.0 %. The global longitudinal strain is normal.  2. Right ventricular systolic function is normal. The right ventricular size is normal.  3. The mitral valve is grossly normal. No evidence of mitral valve regurgitation.  4. The aortic valve is tricuspid. Aortic valve regurgitation is not visualized.  5. The inferior vena cava is normal in size with greater than 50% respiratory variability, suggesting right atrial pressure of 3 mmHg. Comparison(s): No prior Echocardiogram. Conclusion(s)/Recommendation(s): Normal biventricular function without evidence of hemodynamically significant valvular heart disease. FINDINGS  Left Ventricle: Left ventricular ejection fraction, by estimation, is 65 to 70%. The left ventricle has normal function. The left ventricle has no regional wall motion abnormalities. The average left ventricular global longitudinal strain is -26.0 %. The global longitudinal strain is normal. The left ventricular internal cavity size was normal in size. There is borderline left ventricular hypertrophy. Left ventricular diastolic parameters were normal. Right Ventricle: The right ventricular size is normal. No increase in right ventricular wall thickness. Right ventricular systolic function is normal. Left Atrium: Left atrial size was normal in size. Right Atrium: Right atrial size was normal in size. Pericardium:  There is no evidence of pericardial effusion. Mitral Valve: The mitral valve is grossly normal. No evidence of mitral valve regurgitation. Tricuspid Valve: The  tricuspid valve is normal in structure. Tricuspid valve regurgitation is not demonstrated. Aortic Valve: The aortic valve is tricuspid. Aortic valve regurgitation is not visualized. Pulmonic Valve: The pulmonic valve was grossly normal. Pulmonic valve regurgitation is not visualized. No evidence of pulmonic stenosis. Aorta: The aortic root, ascending aorta and aortic arch are all structurally normal, with no evidence of dilitation or obstruction. Venous: The inferior vena cava is normal in size with greater than 50% respiratory variability, suggesting right atrial pressure of 3 mmHg. IAS/Shunts: The atrial septum is grossly normal.  LEFT VENTRICLE PLAX 2D LVIDd:         4.60 cm  Diastology LVIDs:         2.60 cm  LV e' medial:    6.22 cm/s LV PW:         1.10 cm  LV E/e' medial:  10.7 LV IVS:        1.00 cm  LV e' lateral:   11.40 cm/s LVOT diam:     2.20 cm  LV E/e' lateral: 5.8 LV SV:         77 LV SV Index:   31       2D Longitudinal Strain LVOT Area:     3.80 cm 2D Strain GLS (A2C):   -23.0 %                         2D Strain GLS (A3C):   -23.7 %                         2D Strain GLS (A4C):   -31.5 %                         2D Strain GLS Avg:     -26.0 % RIGHT VENTRICLE RV S prime:     11.80 cm/s TAPSE (M-mode): 1.9 cm LEFT ATRIUM             Index       RIGHT ATRIUM           Index LA diam:        3.60 cm 1.45 cm/m  RA Area:     16.10 cm LA Vol (A2C):   42.5 ml 17.13 ml/m RA Volume:   40.90 ml  16.48 ml/m LA Vol (A4C):   42.1 ml 16.97 ml/m LA Biplane Vol: 43.3 ml 17.45 ml/m  AORTIC VALVE LVOT Vmax:   91.80 cm/s LVOT Vmean:  64.500 cm/s LVOT VTI:    0.203 m  AORTA Ao Root diam: 3.20 cm Ao Asc diam:  3.20 cm MITRAL VALVE MV Area (PHT): 2.48 cm    SHUNTS MV Decel Time: 306 msec    Systemic VTI:  0.20 m MV E velocity: 66.40 cm/s  Systemic Diam: 2.20 cm MV A velocity: 67.90 cm/s MV E/A ratio:  0.98 Riley Lam MD Electronically signed by Riley Lam MD Signature Date/Time: 01/15/2021/9:02:57  AM    Final    IR IMAGING GUIDED PORT INSERTION  Result Date: 01/17/2021 INDICATION: Diffuse large B-cell lymphoma EXAM: IMPLANTED PORT A CATH PLACEMENT WITH ULTRASOUND AND FLUOROSCOPIC GUIDANCE MEDICATIONS: None ANESTHESIA/SEDATION: Moderate (conscious) sedation was employed during this procedure. A total of Versed 2 mg and Fentanyl 50 mcg was administered intravenously. Moderate  Sedation Time: 15 minutes. The patient's level of consciousness and vital signs were monitored continuously by radiology nursing throughout the procedure under my direct supervision. FLUOROSCOPY TIME:  0 minutes, 30 seconds (8 mGy) COMPLICATIONS: None immediate. PROCEDURE: The procedure, risks, benefits, and alternatives were explained to the patient. Questions regarding the procedure were encouraged and answered. The patient understands and consents to the procedure. A timeout was performed prior to the initiation of the procedure. Patient positioned supine on the angiography table. Right neck and anterior upper chest prepped and draped in the usual sterile fashion. All elements of maximal sterile barrier were utilized including, cap, mask, sterile gown, sterile gloves, large sterile drape, hand scrubbing and 2% Chlorhexidine for skin cleaning. The right internal jugular vein was evaluated with ultrasound and shown to be patent. A permanent ultrasound image was obtained and placed in the patient's medical record. Local anesthesia was provided with 1% lidocaine with epinephrine. Using sterile gel and a sterile probe cover, the right internal jugular vein was entered with a 21 ga needle during real time ultrasound guidance. 0.018 inch guidewire placed and 21 ga needle exchanged for transitional dilator set. Utilizing fluoroscopy, 0.035 inch guidewire advanced through the needle without difficulty. Attention then turned to the right anterior upper chest. Following local lidocaine administration, a port pocket was created. The catheter  was connected to the port and brought from the pocket to the venotomy site through a subcutaneous tunnel. The catheter was cut to size and inserted through the peel-away sheath. The catheter tip was positioned at the cavoatrial junction using fluoroscopic guidance. The port aspirated and flushed well. The port pocket was closed with deep and superficial absorbable suture. The port pocket incision and venotomy sites were also sealed with Dermabond. IMPRESSION: Successful placement of a right internal jugular approach power injectable Port-A-Cath. The catheter is ready for immediate use. Electronically Signed   By: Miachel Roux M.D.   On: 01/17/2021 15:28    ASSESSMENT & PLAN Steve Smith 60 y.o. male with medical history significant for Stage IV DLBCL who presents for a follow up visit.  After review the labs, the records, schedule the patient the findings most consistent with a diffuse large B-cell lymphoma with staging in process.  We completed the staging with a PET CT scan which showed involvement of the liver and spleen.  At this time his disease does appear as late stage and we will plan for at least 6 cycles of R-CHOP followed by repeat scans.  Previously we discussed the risks and benefits of R-CHOP chemotherapy.  We noted the nature of diffuse large B-cell lymphoma and the possibility of achieving a complete remission with this chemotherapy regimen.  I also noted the side effects which include but are not limited to nausea, vomiting, hair loss, diarrhea, sedation, insomnia, hyperglycemia, high blood pressure, and cardiotoxicity.  The patient voices understanding of the risk and benefits and was agreeable to proceeding with treatment at this time.  The R-CHOP regimen consists of rituximab 375 mg per metered squared IV on day 1, cyclophosphamide 750 mg per metered squared on day 1, doxorubicin 50 mg/m on day 1, vincristine 1.4 mg per metered squared with a max dose of 2 mg on day 1, and  prednisone 100 mg orally days 1 through 5.  A cycle lasts for 21 days and number of cycles planned is 6.   IPI Score: 3 points. (53% progression free survival).   # Diffuse Large B Cell Lymphoma, Stage IV --Findings  at this time are most consistent with a Stage IV diffuse large B-cell lymphoma  He is Stage IV, will require 6 cycles of therapy.  --Baseline labs to include CBC, CMP, uric acid and LDH. --Today is Cycle 2 Day 1 of R-CHOP --Have the patient return to clinic for Cycle 3 Day 1 on 03/04/2021  #Supportive Care --chemotherapy education completed --port in place.  --zofran 8mg  q8H PRN and compazine 10mg  PO q6H for nausea --port placement to be scheduled. -- allopurinol 300mg  PO daily for TLS prophylaxis -- EMLA cream for port -- no pain medication required at this time.   No orders of the defined types were placed in this encounter.   All questions were answered. The patient knows to call the clinic with any problems, questions or concerns.  A total of more than 30 minutes were spent on this encounter and over half of that time was spent on counseling and coordination of care as outlined above.   Ledell Peoples, MD Department of Hematology/Oncology [place] [facility] Phone: [phone] Pager: [phone] Email: [email or web address]  02/11/2021 10:16 AM

## 2021-02-13 ENCOUNTER — Ambulatory Visit: Payer: 59

## 2021-03-03 NOTE — Progress Notes (Signed)
[place] [phone]   Fax:([phone]  PROGRESS NOTE  Smith Care Team: Redmond School, MD as PCP - General (Internal Medicine)  Hematological/Oncological History # Diffuse Large B Cell Lymphoma, Stage IV 12/17/2020: CT scan at [facility], results show left inguinal adenopathy and multiple solid splenic lesions probably of lymphoid origin  12/26/2020: excisional biopsy of left inguinal lymph node shows DLBCL.  01/04/2021: establish care with Dr. Lorenso Courier  01/09/2021: PET CT scan shows multiple hypermetabolic splenic lesions compatible with lymphoma.  There are also 2 hypermetabolic liver lesions also likely related to lymphoma.  No evidence of hypermetabolic activity in Steve neck, chest, abdomen or pelvis elsewhere. 01/21/2021: Cycle 1 Day 1 of R-CHOP chemotherapy 02/11/2021: Cycle 2 Day 1 of R-CHOP chemotherapy 03/04/2021: Cycle 3 Day 1 of R-CHOP chemotherapy  Interval History:  Steve Smith 60 y.o. male with medical history significant for Stage IV DLBCL who presents for a follow up visit. Steve Smith's last visit was on 02/11/2021. In Steve interim since Steve last visit Steve Smith has completed cycle 2 of treatment  On exam today Steve Smith is accompanied by his wife.  He reports that he has felt "good" for Steve last 1.5 weeks.  He notes that he has had no major side effects other than a elevated temperature 1 afternoon.  He reports a temperature reached 100.1 F and that he continue to monitor it overnight.  He notes that Steve fever broke and never increased more than that point.  He notes this was about 8 days after his treatment.  He has not been having any issues with nausea, vomiting, or diarrhea though he notes that his biggest symptom is fatigue occurring mostly in Steve first week.  He has had some declines in blood pressure around that time as well.  His appetite has been good and overall he "feels good".  He notes that he is having minimal bone pain as a result of  Steve Neulasta.  He is actually been able to get some work done around Steve last week of Steve cycle.  A full 10 point ROS is listed below.  MEDICAL HISTORY:  Past Medical History:  Diagnosis Date  . Basal cell carcinoma   . Hypertension   . Kidney stone     SURGICAL HISTORY: Past Surgical History:  Procedure Laterality Date  . INGUINAL LYMPH NODE BIOPSY Left 12/26/2020   Procedure: LEFT INGUINAL EXCISIONAL  LYMPH NODE BIOPSY;  Surgeon: Virl Cagey, MD;  Location: [facility];  Service: General;  Laterality: Left;  . IR IMAGING GUIDED PORT INSERTION  01/17/2021  . ROTATOR CUFF REPAIR     Left    SOCIAL HISTORY: Social History   Socioeconomic History  . Marital status: Married    Spouse name: Not on file  . Number of children: Not on file  . Years of education: Not on file  . Highest education level: Not on file  Occupational History  . Not on file  Tobacco Use  . Smoking status: Never Smoker  . Smokeless tobacco: Never Used  Substance and Sexual Activity  . Alcohol use: No  . Drug use: No  . Sexual activity: Not on file  Other Topics Concern  . Not on file  Social History Narrative  . Not on file   Social Determinants of Health   Financial Resource Strain: Not on file  Food Insecurity: Not on file  Transportation Needs: Not on file  Physical Activity: Not on file  Stress:  Not on file  Social Connections: Not on file  Intimate Partner Violence: Not on file    FAMILY HISTORY: Family History  Problem Relation Age of Onset  . Basal cell carcinoma Mother   . Liver cancer Father     ALLERGIES:  has No Known Allergies.  MEDICATIONS:  Current Outpatient Medications  Medication Sig Dispense Refill  . allopurinol (ZYLOPRIM) 300 MG tablet Take 1 tablet (300 mg total) by mouth daily. 90 tablet 1  . lidocaine-prilocaine (EMLA) cream Apply 1 application topically as needed. 30 g 0  . olmesartan (BENICAR) 20 MG tablet Take 40 mg by mouth daily.    . ondansetron  (ZOFRAN) 4 MG tablet Take 1 tablet (4 mg total) by mouth every 8 (eight) hours as needed. 30 tablet 0  . pantoprazole (PROTONIX) 40 MG tablet Take 1 tablet (40 mg total) by mouth daily. 90 each 1  . predniSONE (DELTASONE) 20 MG tablet Take 3 tablets (60 mg total) by mouth as directed. Take 3 tablets in Steve morning on Day 1 of chemotherapy. Take 3 tablets in Steve morning for Steve next 4 days. 15 tablet 5  . prochlorperazine (COMPAZINE) 10 MG tablet Take 1 tablet (10 mg total) by mouth every 6 (six) hours as needed for nausea or vomiting. 30 tablet 0   No current facility-administered medications for this visit.    REVIEW OF SYSTEMS:   Constitutional: ( - ) fevers, ( - )  chills , ( - ) night sweats Eyes: ( - ) blurriness of vision, ( - ) double vision, ( - ) watery eyes Ears, nose, mouth, throat, and face: ( - ) mucositis, ( - ) sore throat Respiratory: ( - ) cough, ( - ) dyspnea, ( - ) wheezes Cardiovascular: ( - ) palpitation, ( - ) chest discomfort, ( - ) lower extremity swelling Gastrointestinal:  ( - ) nausea, ( - ) heartburn, ( - ) change in bowel habits Skin: ( - ) abnormal skin rashes Lymphatics: ( - ) new lymphadenopathy, ( - ) easy bruising Neurological: ( - ) numbness, ( - ) tingling, ( - ) new weaknesses Behavioral/Psych: ( - ) mood change, ( - ) new changes  All other systems were reviewed with Steve Smith and are negative.  PHYSICAL EXAMINATION: ECOG PERFORMANCE STATUS: 1 - Symptomatic but completely ambulatory  There were no vitals filed for this visit. There were no vitals filed for this visit.  GENERAL: well appearing middle aged Caucasian male. alert, no distress and comfortable SKIN: skin color, texture, turgor are normal, no rashes or significant lesions EYES: conjunctiva are pink and non-injected, sclera clear LUNGS: clear to auscultation and percussion with normal breathing effort HEART: regular rate & rhythm and no murmurs and no lower extremity  edema Musculoskeletal: no cyanosis of digits and no clubbing  PSYCH: alert & oriented x 3, fluent speech NEURO: no focal motor/sensory deficits  LABORATORY DATA:  I have reviewed Steve data as listed CBC Latest Ref Rng & Units 02/11/2021 02/04/2021 01/28/2021  WBC 4.0 - 10.5 K/uL 5.3 3.5(L) 0.7(LL)  Hemoglobin 13.0 - 17.0 g/dL 13.8 13.2 13.6  Hematocrit 39.0 - 52.0 % 40.1 38.9(L) 39.8  Platelets 150 - 400 K/uL 358 192 64(L)    CMP Latest Ref Rng & Units 02/11/2021 02/04/2021 01/28/2021  Glucose 70 - 99 mg/dL 134(H) 138(H) 88  BUN 6 - 20 mg/dL 11 11 16   Creatinine 0.61 - 1.24 mg/dL 1.18 1.21 0.96  Sodium 135 - 145 mmol/L 138  141 136  Potassium 3.5 - 5.1 mmol/L 4.5 3.8 4.1  Chloride 98 - 111 mmol/L 105 107 102  CO2 22 - 32 mmol/L 27 25 27   Calcium 8.9 - 10.3 mg/dL 9.4 8.8(L) 8.6(L)  Total Protein 6.5 - 8.1 g/dL 6.4(L) 6.2(L) 6.1(L)  Total Bilirubin 0.3 - 1.2 mg/dL 0.4 0.3 1.1  Alkaline Phos 38 - 126 U/L 69 74 74  AST 15 - 41 U/L 19 14(L) 10(L)  ALT 0 - 44 U/L 19 16 9     RADIOGRAPHIC STUDIES: I have personally reviewed Steve radiological images as listed and agreed with Steve findings in Steve report: Hypermetabolic lesions in Steve spleen and liver consistent with metastatic disease.  No results found.  ASSESSMENT & PLAN Steve Smith 60 y.o. male with medical history significant for Stage IV DLBCL who presents for a follow up visit.  After review Steve labs, Steve records, schedule Steve Smith Steve findings most consistent with a diffuse large B-cell lymphoma with staging in process.  We completed Steve staging with a PET CT scan which showed involvement of Steve liver and spleen.  At this time his disease does appear as late stage and we will plan for at least 6 cycles of R-CHOP followed by repeat scans.  Previously we discussed Steve risks and benefits of R-CHOP chemotherapy.  We noted Steve nature of diffuse large B-cell lymphoma and Steve possibility of achieving a complete remission with this chemotherapy  regimen.  I also noted Steve side effects which include but are not limited to nausea, vomiting, hair loss, diarrhea, sedation, insomnia, hyperglycemia, high blood pressure, and cardiotoxicity.  Steve Smith voices understanding of Steve risk and benefits and was agreeable to proceeding with treatment at this time.  Steve R-CHOP regimen consists of rituximab 375 mg per metered squared IV on day 1, cyclophosphamide 750 mg per metered squared on day 1, doxorubicin 50 mg/m on day 1, vincristine 1.4 mg per metered squared with a max dose of 2 mg on day 1, and prednisone 100 mg orally days 1 through 5.  A cycle lasts for 21 days and number of cycles planned is 6.   IPI Score: 3 points. (53% progression free survival).   # Diffuse Large B Cell Lymphoma, Stage IV --Findings at this time are most consistent with a Stage IV diffuse large B-cell lymphoma  He is Stage IV, will require 6 cycles of therapy.  --Baseline labs to include CBC, CMP, uric acid and LDH. --Today is Cycle 3 Day 1 of R-CHOP --Have Steve Smith return to clinic for Cycle 4 Day 1 on 03/25/2021  #Supportive Care --chemotherapy education completed --port in place.  --zofran 8mg  q8H PRN and compazine 10mg  PO q6H for nausea --port placement to be scheduled. -- allopurinol 300mg  PO daily for TLS prophylaxis -- EMLA cream for port -- no pain medication required at this time.   No orders of Steve defined types were placed in this encounter.   All questions were answered. Steve Smith knows to call Steve clinic with any problems, questions or concerns.  A total of more than 30 minutes were spent on this encounter and over half of that time was spent on counseling and coordination of care as outlined above.   Ledell Peoples, MD Department of Hematology/Oncology [place] at [facility] Phone: [phone] Pager: [phone] Email: [email or web address]  03/03/2021 6:22 PM

## 2021-03-04 ENCOUNTER — Other Ambulatory Visit: Payer: Self-pay

## 2021-03-04 ENCOUNTER — Inpatient Hospital Stay: Payer: 59

## 2021-03-04 ENCOUNTER — Inpatient Hospital Stay: Payer: 59 | Attending: Hematology and Oncology | Admitting: Hematology and Oncology

## 2021-03-04 ENCOUNTER — Encounter: Payer: Self-pay | Admitting: Hematology and Oncology

## 2021-03-04 VITALS — BP 119/75 | HR 86 | Temp 98.3°F | Resp 16

## 2021-03-04 VITALS — BP 142/84 | HR 98 | Temp 97.2°F | Resp 19 | Ht 75.0 in | Wt 265.8 lb

## 2021-03-04 DIAGNOSIS — Z85828 Personal history of other malignant neoplasm of skin: Secondary | ICD-10-CM | POA: Insufficient documentation

## 2021-03-04 DIAGNOSIS — C8335 Diffuse large B-cell lymphoma, lymph nodes of inguinal region and lower limb: Secondary | ICD-10-CM

## 2021-03-04 DIAGNOSIS — C8338 Diffuse large B-cell lymphoma, lymph nodes of multiple sites: Secondary | ICD-10-CM | POA: Diagnosis present

## 2021-03-04 DIAGNOSIS — Z7952 Long term (current) use of systemic steroids: Secondary | ICD-10-CM | POA: Insufficient documentation

## 2021-03-04 DIAGNOSIS — Z79899 Other long term (current) drug therapy: Secondary | ICD-10-CM | POA: Diagnosis not present

## 2021-03-04 DIAGNOSIS — Z95828 Presence of other vascular implants and grafts: Secondary | ICD-10-CM

## 2021-03-04 DIAGNOSIS — Z5111 Encounter for antineoplastic chemotherapy: Secondary | ICD-10-CM | POA: Insufficient documentation

## 2021-03-04 DIAGNOSIS — Z5189 Encounter for other specified aftercare: Secondary | ICD-10-CM | POA: Insufficient documentation

## 2021-03-04 DIAGNOSIS — Z5112 Encounter for antineoplastic immunotherapy: Secondary | ICD-10-CM | POA: Insufficient documentation

## 2021-03-04 DIAGNOSIS — I10 Essential (primary) hypertension: Secondary | ICD-10-CM | POA: Insufficient documentation

## 2021-03-04 LAB — CBC WITH DIFFERENTIAL (CANCER CENTER ONLY)
Abs Immature Granulocytes: 0.03 10*3/uL (ref 0.00–0.07)
Basophils Absolute: 0 10*3/uL (ref 0.0–0.1)
Basophils Relative: 1 %
Eosinophils Absolute: 0.1 10*3/uL (ref 0.0–0.5)
Eosinophils Relative: 1 %
HCT: 36.4 % — ABNORMAL LOW (ref 39.0–52.0)
Hemoglobin: 12.6 g/dL — ABNORMAL LOW (ref 13.0–17.0)
Immature Granulocytes: 1 %
Lymphocytes Relative: 13 %
Lymphs Abs: 0.7 10*3/uL (ref 0.7–4.0)
MCH: 31.1 pg (ref 26.0–34.0)
MCHC: 34.6 g/dL (ref 30.0–36.0)
MCV: 89.9 fL (ref 80.0–100.0)
Monocytes Absolute: 0.5 10*3/uL (ref 0.1–1.0)
Monocytes Relative: 10 %
Neutro Abs: 3.9 10*3/uL (ref 1.7–7.7)
Neutrophils Relative %: 74 %
Platelet Count: 302 10*3/uL (ref 150–400)
RBC: 4.05 MIL/uL — ABNORMAL LOW (ref 4.22–5.81)
RDW: 16.2 % — ABNORMAL HIGH (ref 11.5–15.5)
WBC Count: 5.2 10*3/uL (ref 4.0–10.5)
nRBC: 0 % (ref 0.0–0.2)

## 2021-03-04 LAB — CMP (CANCER CENTER ONLY)
ALT: 16 U/L (ref 0–44)
AST: 22 U/L (ref 15–41)
Albumin: 3.4 g/dL — ABNORMAL LOW (ref 3.5–5.0)
Alkaline Phosphatase: 61 U/L (ref 38–126)
Anion gap: 10 (ref 5–15)
BUN: 19 mg/dL (ref 6–20)
CO2: 24 mmol/L (ref 22–32)
Calcium: 9.6 mg/dL (ref 8.9–10.3)
Chloride: 105 mmol/L (ref 98–111)
Creatinine: 1.12 mg/dL (ref 0.61–1.24)
GFR, Estimated: >60 mL/min (ref >60)
Glucose, Bld: 142 mg/dL — ABNORMAL HIGH (ref 70–99)
Potassium: 4.1 mmol/L (ref 3.5–5.1)
Sodium: 139 mmol/L (ref 135–145)
Total Bilirubin: 0.2 mg/dL — ABNORMAL LOW (ref 0.3–1.2)
Total Protein: 6.5 g/dL (ref 6.5–8.1)

## 2021-03-04 LAB — LACTATE DEHYDROGENASE: LDH: 256 U/L — ABNORMAL HIGH (ref 98–192)

## 2021-03-04 LAB — URIC ACID: Uric Acid, Serum: 3.7 mg/dL (ref 3.7–8.6)

## 2021-03-04 MED ORDER — HEPARIN SOD (PORK) LOCK FLUSH 100 UNIT/ML IV SOLN
500.0000 [IU] | Freq: Once | INTRAVENOUS | Status: AC | PRN
Start: 2021-03-04 — End: 2021-03-04
  Administered 2021-03-04: 500 [IU]
  Filled 2021-03-04: qty 5

## 2021-03-04 MED ORDER — SODIUM CHLORIDE 0.9% FLUSH
10.0000 mL | INTRAVENOUS | Status: DC | PRN
  Administered 2021-03-04: 10 mL
  Filled 2021-03-04: qty 10

## 2021-03-04 MED ORDER — PALONOSETRON HCL INJECTION 0.25 MG/5ML
INTRAVENOUS | Status: AC
  Filled 2021-03-04: qty 5

## 2021-03-04 MED ORDER — PEGFILGRASTIM 6 MG/0.6ML ~~LOC~~ PSKT
6.0000 mg | PREFILLED_SYRINGE | Freq: Once | SUBCUTANEOUS | Status: AC
  Administered 2021-03-04: 6 mg via SUBCUTANEOUS

## 2021-03-04 MED ORDER — DIPHENHYDRAMINE HCL 25 MG PO CAPS
50.0000 mg | ORAL_CAPSULE | Freq: Once | ORAL | Status: AC
  Administered 2021-03-04: 50 mg via ORAL

## 2021-03-04 MED ORDER — SODIUM CHLORIDE 0.9 % IV SOLN
375.0000 mg/m2 | Freq: Once | INTRAVENOUS | Status: AC
  Administered 2021-03-04: 900 mg via INTRAVENOUS
  Filled 2021-03-04: qty 50

## 2021-03-04 MED ORDER — ACETAMINOPHEN 325 MG PO TABS
650.0000 mg | ORAL_TABLET | Freq: Once | ORAL | Status: AC
  Administered 2021-03-04: 650 mg via ORAL

## 2021-03-04 MED ORDER — DIPHENHYDRAMINE HCL 25 MG PO CAPS
ORAL_CAPSULE | ORAL | Status: AC
  Filled 2021-03-04: qty 2

## 2021-03-04 MED ORDER — DOXORUBICIN HCL CHEMO IV INJECTION 2 MG/ML
50.0000 mg/m2 | Freq: Once | INTRAVENOUS | Status: AC
  Administered 2021-03-04: 126 mg via INTRAVENOUS
  Filled 2021-03-04: qty 63

## 2021-03-04 MED ORDER — SODIUM CHLORIDE 0.9 % IV SOLN
150.0000 mg | Freq: Once | INTRAVENOUS | Status: AC
  Administered 2021-03-04: 150 mg via INTRAVENOUS
  Filled 2021-03-04: qty 150

## 2021-03-04 MED ORDER — PEGFILGRASTIM 6 MG/0.6ML ~~LOC~~ PSKT
PREFILLED_SYRINGE | SUBCUTANEOUS | Status: AC
  Filled 2021-03-04: qty 0.6

## 2021-03-04 MED ORDER — ACETAMINOPHEN 325 MG PO TABS
ORAL_TABLET | ORAL | Status: AC
  Filled 2021-03-04: qty 2

## 2021-03-04 MED ORDER — VINCRISTINE SULFATE CHEMO INJECTION 1 MG/ML
2.0000 mg | Freq: Once | INTRAVENOUS | Status: AC
  Administered 2021-03-04: 2 mg via INTRAVENOUS
  Filled 2021-03-04: qty 2

## 2021-03-04 MED ORDER — SODIUM CHLORIDE 0.9 % IV SOLN
Freq: Once | INTRAVENOUS | Status: AC
  Filled 2021-03-04: qty 250

## 2021-03-04 MED ORDER — SODIUM CHLORIDE 0.9% FLUSH
10.0000 mL | Freq: Once | INTRAVENOUS | Status: AC
Start: 2021-03-04 — End: 2021-03-04
  Administered 2021-03-04: 10 mL
  Filled 2021-03-04: qty 10

## 2021-03-04 MED ORDER — SODIUM CHLORIDE 0.9 % IV SOLN
10.0000 mg | Freq: Once | INTRAVENOUS | Status: AC
  Administered 2021-03-04: 10 mg via INTRAVENOUS
  Filled 2021-03-04: qty 10

## 2021-03-04 MED ORDER — PALONOSETRON HCL INJECTION 0.25 MG/5ML
0.2500 mg | Freq: Once | INTRAVENOUS | Status: AC
  Administered 2021-03-04: 0.25 mg via INTRAVENOUS

## 2021-03-04 MED ORDER — SODIUM CHLORIDE 0.9 % IV SOLN
750.0000 mg/m2 | Freq: Once | INTRAVENOUS | Status: AC
  Administered 2021-03-04: 1900 mg via INTRAVENOUS
  Filled 2021-03-04: qty 95

## 2021-03-04 NOTE — Patient Instructions (Signed)
[facility] ONCOL[facility]  Discharge Instructions: Thank you for choosing [facility] to provide your oncology and hematology care.   If you have a lab appointment with the [facility], please go directly to the [facility] and check in at the registration area.   Wear comfortable clothing and clothing appropriate for easy access to any Portacath or PICC line.   We strive to give you quality time with your provider. You may need to reschedule your appointment if you arrive late (15 or more minutes).  Arriving late affects you and other patients whose appointments are after yours.  Also, if you miss three or more appointments without notifying the office, you may be dismissed from the clinic at the provider's discretion.      For prescription refill requests, have your pharmacy contact our office and allow 72 hours for refills to be completed.    Today you received the following chemotherapy and/or immunotherapy agents: doxorubicin/vincristine/cyclophosphamide/rituximab.      To help prevent nausea and vomiting after your treatment, we encourage you to take your nausea medication as directed.  BELOW ARE SYMPTOMS THAT SHOULD BE REPORTED IMMEDIATELY: . *FEVER GREATER THAN 100.4 F (38 C) OR HIGHER . *CHILLS OR SWEATING . *NAUSEA AND VOMITING THAT IS NOT CONTROLLED WITH YOUR NAUSEA MEDICATION . *UNUSUAL SHORTNESS OF BREATH . *UNUSUAL BRUISING OR BLEEDING . *URINARY PROBLEMS (pain or burning when urinating, or frequent urination) . *BOWEL PROBLEMS (unusual diarrhea, constipation, pain near the anus) . TENDERNESS IN MOUTH AND THROAT WITH OR WITHOUT PRESENCE OF ULCERS (sore throat, sores in mouth, or a toothache) . UNUSUAL RASH, SWELLING OR PAIN  . UNUSUAL VAGINAL DISCHARGE OR ITCHING   Items with * indicate a potential emergency and should be followed up as soon as possible or go to the Emergency Department if any problems should occur.  Please show the  CHEMOTHERAPY ALERT CARD or IMMUNOTHERAPY ALERT CARD at check-in to the Emergency Department and triage nurse.  Should you have questions after your visit or need to cancel or reschedule your appointment, please contact [facility]  Dept: [phone]  and follow the prompts.  Office hours are 8:00 a.m. to 4:30 p.m. Monday - Friday. Please note that voicemails left after 4:00 p.m. may not be returned until the following business day.  We are closed weekends and major holidays. You have access to a nurse at all times for urgent questions. Please call the main number to the clinic Dept: [phone] and follow the prompts.   For any non-urgent questions, you may also contact your provider using MyChart. We now offer e-Visits for anyone 16 and older to request care online for non-urgent symptoms. For details visit [email or web address].   Also download the MyChart app! Go to the app store, search "MyChart", open the app, select [facility], and log in with your MyChart username and password.  Due to Covid, a mask is required upon entering the hospital/clinic. If you do not have a mask, one will be given to you upon arrival. For doctor visits, patients may have 1 support person aged 25 or older with them. For treatment visits, patients cannot have anyone with them due to current Covid guidelines and our immunocompromised population.

## 2021-03-06 ENCOUNTER — Ambulatory Visit: Payer: 59

## 2021-03-07 ENCOUNTER — Telehealth: Payer: Self-pay | Admitting: Hematology and Oncology

## 2021-03-07 NOTE — Telephone Encounter (Signed)
Scheduled per los. Called and left msg. Mailed printout  °

## 2021-03-13 ENCOUNTER — Inpatient Hospital Stay (HOSPITAL_BASED_OUTPATIENT_CLINIC_OR_DEPARTMENT_OTHER): Payer: 59 | Admitting: Hematology and Oncology

## 2021-03-13 ENCOUNTER — Other Ambulatory Visit: Payer: Self-pay

## 2021-03-13 ENCOUNTER — Inpatient Hospital Stay: Payer: 59

## 2021-03-13 VITALS — BP 136/78 | HR 122 | Resp 19 | Wt 258.9 lb

## 2021-03-13 DIAGNOSIS — C8335 Diffuse large B-cell lymphoma, lymph nodes of inguinal region and lower limb: Secondary | ICD-10-CM

## 2021-03-13 DIAGNOSIS — Z95828 Presence of other vascular implants and grafts: Secondary | ICD-10-CM

## 2021-03-13 DIAGNOSIS — Z5112 Encounter for antineoplastic immunotherapy: Secondary | ICD-10-CM | POA: Diagnosis not present

## 2021-03-13 LAB — CBC WITH DIFFERENTIAL (CANCER CENTER ONLY)
Abs Immature Granulocytes: 0.05 10*3/uL (ref 0.00–0.07)
Basophils Absolute: 0 10*3/uL (ref 0.0–0.1)
Basophils Relative: 2 %
Eosinophils Absolute: 0 10*3/uL (ref 0.0–0.5)
Eosinophils Relative: 2 %
HCT: 34.5 % — ABNORMAL LOW (ref 39.0–52.0)
Hemoglobin: 11.8 g/dL — ABNORMAL LOW (ref 13.0–17.0)
Immature Granulocytes: 3 %
Lymphocytes Relative: 11 %
Lymphs Abs: 0.2 10*3/uL — ABNORMAL LOW (ref 0.7–4.0)
MCH: 30.8 pg (ref 26.0–34.0)
MCHC: 34.2 g/dL (ref 30.0–36.0)
MCV: 90.1 fL (ref 80.0–100.0)
Monocytes Absolute: 0.3 10*3/uL (ref 0.1–1.0)
Monocytes Relative: 15 %
Neutro Abs: 1.3 10*3/uL — ABNORMAL LOW (ref 1.7–7.7)
Neutrophils Relative %: 67 %
Platelet Count: 54 10*3/uL — ABNORMAL LOW (ref 150–400)
RBC: 3.83 MIL/uL — ABNORMAL LOW (ref 4.22–5.81)
RDW: 15.7 % — ABNORMAL HIGH (ref 11.5–15.5)
WBC Count: 1.9 10*3/uL — ABNORMAL LOW (ref 4.0–10.5)
nRBC: 0 % (ref 0.0–0.2)

## 2021-03-13 LAB — CMP (CANCER CENTER ONLY)
ALT: 16 U/L (ref 0–44)
AST: 11 U/L — ABNORMAL LOW (ref 15–41)
Albumin: 3.3 g/dL — ABNORMAL LOW (ref 3.5–5.0)
Alkaline Phosphatase: 73 U/L (ref 38–126)
Anion gap: 10 (ref 5–15)
BUN: 17 mg/dL (ref 6–20)
CO2: 22 mmol/L (ref 22–32)
Calcium: 9.5 mg/dL (ref 8.9–10.3)
Chloride: 103 mmol/L (ref 98–111)
Creatinine: 1.07 mg/dL (ref 0.61–1.24)
GFR, Estimated: >60 mL/min (ref >60)
Glucose, Bld: 153 mg/dL — ABNORMAL HIGH (ref 70–99)
Potassium: 4.2 mmol/L (ref 3.5–5.1)
Sodium: 135 mmol/L (ref 135–145)
Total Bilirubin: 0.6 mg/dL (ref 0.3–1.2)
Total Protein: 6.8 g/dL (ref 6.5–8.1)

## 2021-03-13 LAB — URIC ACID: Uric Acid, Serum: 2.8 mg/dL — ABNORMAL LOW (ref 3.7–8.6)

## 2021-03-13 LAB — LACTATE DEHYDROGENASE: LDH: 175 U/L (ref 98–192)

## 2021-03-13 MED ORDER — DOXYCYCLINE HYCLATE 100 MG PO TABS: 100.0000 mg | ORAL_TABLET | Freq: Two times a day (BID) | ORAL | 0 refills | Status: DC

## 2021-03-13 NOTE — Progress Notes (Signed)
[name] [phone]   Fax:([phone]  PROGRESS NOTE  Patient Care Team: Redmond School, MD as PCP - General (Internal Medicine)  Hematological/Oncological History # Diffuse Large B Cell Lymphoma, Stage IV 12/17/2020: CT scan at [facility], results show left inguinal adenopathy and multiple solid splenic lesions probably of lymphoid origin  12/26/2020: excisional biopsy of left inguinal lymph node shows DLBCL.  01/04/2021: establish care with Dr. Lorenso Courier  01/09/2021: PET CT scan shows multiple hypermetabolic splenic lesions compatible with lymphoma.  There are also 2 hypermetabolic liver lesions also likely related to lymphoma.  No evidence of hypermetabolic activity in the neck, chest, abdomen or pelvis elsewhere. 01/21/2021: Cycle 1 Day 1 of R-CHOP chemotherapy 02/11/2021: Cycle 2 Day 1 of R-CHOP chemotherapy 03/04/2021: Cycle 3 Day 1 of R-CHOP chemotherapy  Interval History:  Steve Smith 60 y.o. male with medical history significant for Stage IV DLBCL who presents for a follow up visit. The patient's last visit was on 03/04/2021. In the interim since the last visit Steve Smith has developed temperatures and a rash. He presents today for an urgent visit to assess these issues.   On exam today Steve Smith is accompanied by his wife.  He reports he has been having issues with fatigue, runny nose, aches, and headaches in the interim since our last visit.  He is also developed skin lesions which are small pustules that have occurred across the body.  These are sporadic and not clustered.  Total there are only about 8-10 of these small lesions on his arms, legs, and torso.  He is also been having trouble with fever with temperatures greater than 100.4.  He notes that the fever broke this morning and has been taking Tylenol to try to help with this.  He notes that this is the worst he has felt since he is started the chemotherapy treatments.  Fortunately has not been having any  nausea, vomiting, or diarrhea.  He interestingly does report having a tick bite a few days ago.  Otherwise he has had no focal symptoms, increased lymphadenopathy, or other concerning findings.  A full 10 point ROS is listed below.  MEDICAL HISTORY:  Past Medical History:  Diagnosis Date   Basal cell carcinoma    Hypertension    Kidney stone     SURGICAL HISTORY: Past Surgical History:  Procedure Laterality Date   INGUINAL LYMPH NODE BIOPSY Left 12/26/2020   Procedure: LEFT INGUINAL EXCISIONAL  LYMPH NODE BIOPSY;  Surgeon: Virl Cagey, MD;  Location: [facility];  Service: General;  Laterality: Left;   IR IMAGING GUIDED PORT INSERTION  01/17/2021   ROTATOR CUFF REPAIR     Left    SOCIAL HISTORY: Social History   Socioeconomic History   Marital status: Married    Spouse name: Not on file   Number of children: Not on file   Years of education: Not on file   Highest education level: Not on file  Occupational History   Not on file  Tobacco Use   Smoking status: Never   Smokeless tobacco: Never  Substance and Sexual Activity   Alcohol use: No   Drug use: No   Sexual activity: Not on file  Other Topics Concern   Not on file  Social History Narrative   Not on file   Social Determinants of Health   Financial Resource Strain: Not on file  Food Insecurity: Not on file  Transportation Needs: Not on file  Physical Activity: Not  on file  Stress: Not on file  Social Connections: Not on file  Intimate Partner Violence: Not on file    FAMILY HISTORY: Family History  Problem Relation Age of Onset   Basal cell carcinoma Mother    Liver cancer Father     ALLERGIES:  has No Known Allergies.  MEDICATIONS:  Current Outpatient Medications  Medication Sig Dispense Refill   doxycycline (VIBRA-TABS) 100 MG tablet Take 1 tablet (100 mg total) by mouth 2 (two) times daily. 28 tablet 0   allopurinol (ZYLOPRIM) 300 MG tablet Take 1 tablet (300 mg total) by mouth daily. 90 tablet  1   lidocaine-prilocaine (EMLA) cream Apply 1 application topically as needed. 30 g 0   olmesartan (BENICAR) 20 MG tablet Take 40 mg by mouth daily.     ondansetron (ZOFRAN) 4 MG tablet Take 1 tablet (4 mg total) by mouth every 8 (eight) hours as needed. 30 tablet 0   pantoprazole (PROTONIX) 40 MG tablet Take 1 tablet (40 mg total) by mouth daily. 90 each 1   predniSONE (DELTASONE) 20 MG tablet Take 3 tablets (60 mg total) by mouth as directed. Take 3 tablets in the morning on Day 1 of chemotherapy. Take 3 tablets in the morning for the next 4 days. 15 tablet 5   prochlorperazine (COMPAZINE) 10 MG tablet Take 1 tablet (10 mg total) by mouth every 6 (six) hours as needed for nausea or vomiting. 30 tablet 0   No current facility-administered medications for this visit.    REVIEW OF SYSTEMS:   Constitutional: ( - ) fevers, ( - )  chills , ( - ) night sweats Eyes: ( - ) blurriness of vision, ( - ) double vision, ( - ) watery eyes Ears, nose, mouth, throat, and face: ( - ) mucositis, ( - ) sore throat Respiratory: ( - ) cough, ( - ) dyspnea, ( - ) wheezes Cardiovascular: ( - ) palpitation, ( - ) chest discomfort, ( - ) lower extremity swelling Gastrointestinal:  ( - ) nausea, ( - ) heartburn, ( - ) change in bowel habits Skin: ( - ) abnormal skin rashes Lymphatics: ( - ) new lymphadenopathy, ( - ) easy bruising Neurological: ( - ) numbness, ( - ) tingling, ( - ) new weaknesses Behavioral/Psych: ( - ) mood change, ( - ) new changes  All other systems were reviewed with the patient and are negative.  PHYSICAL EXAMINATION: ECOG PERFORMANCE STATUS: 1 - Symptomatic but completely ambulatory  Vitals:   03/13/21 1137  BP: 136/78  Pulse: (!) 122  Resp: 19  SpO2: 100%   Filed Weights   03/13/21 1137  Weight: 258 lb 14.4 oz (117.4 kg)    GENERAL: well appearing middle aged Caucasian male. alert, no distress and comfortable SKIN: small pustules scattered across multiple body regions. skin  color, texture, turgor are normal, no rashes or significant lesions EYES: conjunctiva are pink and non-injected, sclera clear LUNGS: clear to auscultation and percussion with normal breathing effort HEART: regular rate & rhythm and no murmurs and no lower extremity edema Musculoskeletal: no cyanosis of digits and no clubbing  PSYCH: alert & oriented x 3, fluent speech NEURO: no focal motor/sensory deficits  LABORATORY DATA:  I have reviewed the data as listed CBC Latest Ref Rng & Units 03/13/2021 03/04/2021 02/11/2021  WBC 4.0 - 10.5 K/uL 1.9(L) 5.2 5.3  Hemoglobin 13.0 - 17.0 g/dL 11.8(L) 12.6(L) 13.8  Hematocrit 39.0 - 52.0 % 34.5(L) 36.4(L) 40.1  Platelets  150 - 400 K/uL 54(L) 302 358    CMP Latest Ref Rng & Units 03/13/2021 03/04/2021 02/11/2021  Glucose 70 - 99 mg/dL 153(H) 142(H) 134(H)  BUN 6 - 20 mg/dL 17 19 11   Creatinine 0.61 - 1.24 mg/dL 1.07 1.12 1.18  Sodium 135 - 145 mmol/L 135 139 138  Potassium 3.5 - 5.1 mmol/L 4.2 4.1 4.5  Chloride 98 - 111 mmol/L 103 105 105  CO2 22 - 32 mmol/L 22 24 27   Calcium 8.9 - 10.3 mg/dL 9.5 9.6 9.4  Total Protein 6.5 - 8.1 g/dL 6.8 6.5 6.4(L)  Total Bilirubin 0.3 - 1.2 mg/dL 0.6 0.2(L) 0.4  Alkaline Phos 38 - 126 U/L 73 61 69  AST 15 - 41 U/L 11(L) 22 19  ALT 0 - 44 U/L 16 16 19     RADIOGRAPHIC STUDIES: I have personally reviewed the radiological images as listed and agreed with the findings in the report: Hypermetabolic lesions in the spleen and liver consistent with metastatic disease.  No results found.  ASSESSMENT & PLAN Steve Smith 60 y.o. male with medical history significant for Stage IV DLBCL who presents for a follow up visit.  After review the labs, the records, schedule the patient the findings most consistent with a diffuse large B-cell lymphoma with staging in process.  We completed the staging with a PET CT scan which showed involvement of the liver and spleen.  At this time his disease does appear as late stage and we will  plan for at least 6 cycles of R-CHOP followed by repeat scans.   Previously we discussed the risks and benefits of R-CHOP chemotherapy.  We noted the nature of diffuse large B-cell lymphoma and the possibility of achieving a complete remission with this chemotherapy regimen.  I also noted the side effects which include but are not limited to nausea, vomiting, hair loss, diarrhea, sedation, insomnia, hyperglycemia, high blood pressure, and cardiotoxicity.  The patient voices understanding of the risk and benefits and was agreeable to proceeding with treatment at this time.   The R-CHOP regimen consists of rituximab 375 mg per metered squared IV on day 1, cyclophosphamide 750 mg per metered squared on day 1, doxorubicin 50 mg/m on day 1, vincristine 1.4 mg per metered squared with a max dose of 2 mg on day 1, and prednisone 100 mg orally days 1 through 5.  A cycle lasts for 21 days and number of cycles planned is 6.   IPI Score: 3 points. (53% progression free survival).   # Rash #Fever  -- Findings at this time are most consistent with scattered skin lesions most consistent with superficial skin infections.  I do not believe these to be related to his fever. --Given the skin lesions, the fever, tick bite I would recommend we proceed with doxycycline 100 mg twice daily x14 days to see if this improves his symptoms. --Patient went home and took a home COVID test which was negative.  If symptoms persist can consider having this repeated in a professional setting --No other focal symptoms.  Patient is not neutropenic and therefore there is no indication for admission and IV antibiotics. --Strict return precautions for worsening fever or new focal symptoms.  #Diffuse Large B Cell Lymphoma, Stage IV --Findings at this time are most consistent with a Stage IV diffuse large B-cell lymphoma  He is Stage IV, will require 6 cycles of therapy.  --Baseline labs to include CBC, CMP, uric acid and LDH. --last  visit was  Cycle 3 Day 1 of R-CHOP --Have the patient return to clinic for Cycle 4 Day 1 on 03/25/2021   #Supportive Care --chemotherapy education completed --port in place.  --zofran 8mg  q8H PRN and compazine 10mg  PO q6H for nausea --port placement to be scheduled. -- allopurinol 300mg  PO daily for TLS prophylaxis -- EMLA cream for port -- no pain medication required at this time.   Orders Placed This Encounter  Procedures   CBC with Differential ([facility] Only)    Standing Status:   Future    Standing Expiration Date:   03/13/2022   CMP ([facility] only)    Standing Status:   Future    Standing Expiration Date:   03/13/2022     All questions were answered. The patient knows to call the clinic with any problems, questions or concerns.  A total of more than 30 minutes were spent on this encounter and over half of that time was spent on counseling and coordination of care as outlined above.   Ledell Peoples, MD Department of Hematology/Oncology [place] at [facility] Phone: [phone] Pager: [phone] Email: [email or web address]  03/16/2021 12:41 PM

## 2021-03-16 ENCOUNTER — Encounter: Payer: Self-pay | Admitting: Hematology and Oncology

## 2021-03-19 ENCOUNTER — Encounter: Payer: Self-pay | Admitting: Hematology and Oncology

## 2021-03-20 ENCOUNTER — Ambulatory Visit (HOSPITAL_COMMUNITY)
Admission: RE | Admit: 2021-03-20 | Discharge: 2021-03-20 | Disposition: A | Discharge status: Home / Self Care | Payer: 59 | Source: Ambulatory Visit | Attending: Hematology and Oncology | Admitting: Hematology and Oncology

## 2021-03-20 ENCOUNTER — Other Ambulatory Visit: Payer: Self-pay

## 2021-03-20 DIAGNOSIS — C8335 Diffuse large B-cell lymphoma, lymph nodes of inguinal region and lower limb: Secondary | ICD-10-CM | POA: Insufficient documentation

## 2021-03-20 LAB — GLUCOSE, CAPILLARY: Glucose-Capillary: 110 mg/dL — ABNORMAL HIGH (ref 70–99)

## 2021-03-20 MED ORDER — FLUDEOXYGLUCOSE F - 18 (FDG) INJECTION: 12.4000 | Freq: Once | INTRAVENOUS | Status: DC | PRN

## 2021-03-25 ENCOUNTER — Inpatient Hospital Stay: Payer: 59

## 2021-03-25 ENCOUNTER — Inpatient Hospital Stay (HOSPITAL_BASED_OUTPATIENT_CLINIC_OR_DEPARTMENT_OTHER): Payer: 59 | Admitting: Hematology and Oncology

## 2021-03-25 ENCOUNTER — Other Ambulatory Visit: Payer: Self-pay

## 2021-03-25 VITALS — BP 131/80 | HR 79 | Temp 97.9°F | Resp 18

## 2021-03-25 VITALS — BP 135/86 | HR 97 | Temp 98.0°F | Resp 17 | Ht 75.0 in | Wt 267.4 lb

## 2021-03-25 DIAGNOSIS — C8335 Diffuse large B-cell lymphoma, lymph nodes of inguinal region and lower limb: Secondary | ICD-10-CM

## 2021-03-25 DIAGNOSIS — Z95828 Presence of other vascular implants and grafts: Secondary | ICD-10-CM

## 2021-03-25 DIAGNOSIS — Z5112 Encounter for antineoplastic immunotherapy: Secondary | ICD-10-CM | POA: Diagnosis not present

## 2021-03-25 LAB — CMP (CANCER CENTER ONLY)
ALT: 14 U/L (ref 0–44)
AST: 18 U/L (ref 15–41)
Albumin: 3 g/dL — ABNORMAL LOW (ref 3.5–5.0)
Alkaline Phosphatase: 66 U/L (ref 38–126)
Anion gap: 9 (ref 5–15)
BUN: 12 mg/dL (ref 6–20)
CO2: 25 mmol/L (ref 22–32)
Calcium: 9.3 mg/dL (ref 8.9–10.3)
Chloride: 105 mmol/L (ref 98–111)
Creatinine: 1.09 mg/dL (ref 0.61–1.24)
GFR, Estimated: >60 mL/min (ref >60)
Glucose, Bld: 144 mg/dL — ABNORMAL HIGH (ref 70–99)
Potassium: 4.2 mmol/L (ref 3.5–5.1)
Sodium: 139 mmol/L (ref 135–145)
Total Bilirubin: 0.3 mg/dL (ref 0.3–1.2)
Total Protein: 6.1 g/dL — ABNORMAL LOW (ref 6.5–8.1)

## 2021-03-25 LAB — CBC WITH DIFFERENTIAL (CANCER CENTER ONLY)
Abs Immature Granulocytes: 0.03 10*3/uL (ref 0.00–0.07)
Basophils Absolute: 0.1 10*3/uL (ref 0.0–0.1)
Basophils Relative: 1 %
Eosinophils Absolute: 0 10*3/uL (ref 0.0–0.5)
Eosinophils Relative: 1 %
HCT: 32.3 % — ABNORMAL LOW (ref 39.0–52.0)
Hemoglobin: 10.8 g/dL — ABNORMAL LOW (ref 13.0–17.0)
Immature Granulocytes: 1 %
Lymphocytes Relative: 10 %
Lymphs Abs: 0.4 10*3/uL — ABNORMAL LOW (ref 0.7–4.0)
MCH: 31.1 pg (ref 26.0–34.0)
MCHC: 33.4 g/dL (ref 30.0–36.0)
MCV: 93.1 fL (ref 80.0–100.0)
Monocytes Absolute: 0.5 10*3/uL (ref 0.1–1.0)
Monocytes Relative: 13 %
Neutro Abs: 2.9 10*3/uL (ref 1.7–7.7)
Neutrophils Relative %: 74 %
Platelet Count: 300 10*3/uL (ref 150–400)
RBC: 3.47 MIL/uL — ABNORMAL LOW (ref 4.22–5.81)
RDW: 17.6 % — ABNORMAL HIGH (ref 11.5–15.5)
WBC Count: 3.9 10*3/uL — ABNORMAL LOW (ref 4.0–10.5)
nRBC: 0 % (ref 0.0–0.2)

## 2021-03-25 LAB — URIC ACID: Uric Acid, Serum: 3.7 mg/dL (ref 3.7–8.6)

## 2021-03-25 MED ORDER — VINCRISTINE SULFATE CHEMO INJECTION 1 MG/ML
2.0000 mg | Freq: Once | INTRAVENOUS | Status: AC
  Administered 2021-03-25: 2 mg via INTRAVENOUS
  Filled 2021-03-25: qty 2

## 2021-03-25 MED ORDER — SODIUM CHLORIDE 0.9 % IV SOLN
Freq: Once | INTRAVENOUS | Status: AC
  Filled 2021-03-25: qty 250

## 2021-03-25 MED ORDER — SODIUM CHLORIDE 0.9 % IV SOLN
375.0000 mg/m2 | Freq: Once | INTRAVENOUS | Status: AC
  Administered 2021-03-25: 900 mg via INTRAVENOUS
  Filled 2021-03-25: qty 40

## 2021-03-25 MED ORDER — DEXAMETHASONE SODIUM PHOSPHATE 100 MG/10ML IJ SOLN
10.0000 mg | Freq: Once | INTRAMUSCULAR | Status: AC
  Administered 2021-03-25: 10 mg via INTRAVENOUS
  Filled 2021-03-25: qty 10

## 2021-03-25 MED ORDER — PALONOSETRON HCL INJECTION 0.25 MG/5ML
INTRAVENOUS | Status: AC
  Filled 2021-03-25: qty 5

## 2021-03-25 MED ORDER — PEGFILGRASTIM 6 MG/0.6ML ~~LOC~~ PSKT
6.0000 mg | PREFILLED_SYRINGE | Freq: Once | SUBCUTANEOUS | Status: AC
  Administered 2021-03-25: 6 mg via SUBCUTANEOUS

## 2021-03-25 MED ORDER — ACETAMINOPHEN 325 MG PO TABS
650.0000 mg | ORAL_TABLET | Freq: Once | ORAL | Status: AC
  Administered 2021-03-25: 650 mg via ORAL

## 2021-03-25 MED ORDER — PALONOSETRON HCL INJECTION 0.25 MG/5ML
0.2500 mg | Freq: Once | INTRAVENOUS | Status: AC
  Administered 2021-03-25: 0.25 mg via INTRAVENOUS

## 2021-03-25 MED ORDER — DIPHENHYDRAMINE HCL 25 MG PO CAPS
50.0000 mg | ORAL_CAPSULE | Freq: Once | ORAL | Status: AC
  Administered 2021-03-25: 50 mg via ORAL

## 2021-03-25 MED ORDER — PEGFILGRASTIM 6 MG/0.6ML ~~LOC~~ PSKT
PREFILLED_SYRINGE | SUBCUTANEOUS | Status: AC
  Filled 2021-03-25: qty 0.6

## 2021-03-25 MED ORDER — DOXORUBICIN HCL CHEMO IV INJECTION 2 MG/ML
50.0000 mg/m2 | Freq: Once | INTRAVENOUS | Status: AC
  Administered 2021-03-25: 126 mg via INTRAVENOUS
  Filled 2021-03-25: qty 63

## 2021-03-25 MED ORDER — SODIUM CHLORIDE 0.9% FLUSH
10.0000 mL | INTRAVENOUS | Status: DC | PRN
  Administered 2021-03-25: 10 mL
  Filled 2021-03-25: qty 10

## 2021-03-25 MED ORDER — DIPHENHYDRAMINE HCL 25 MG PO CAPS
ORAL_CAPSULE | ORAL | Status: AC
  Filled 2021-03-25: qty 2

## 2021-03-25 MED ORDER — SODIUM CHLORIDE 0.9% FLUSH
10.0000 mL | Freq: Once | INTRAVENOUS | Status: AC
  Administered 2021-03-25: 10 mL
  Filled 2021-03-25: qty 10

## 2021-03-25 MED ORDER — ACETAMINOPHEN 325 MG PO TABS
ORAL_TABLET | ORAL | Status: AC
  Filled 2021-03-25: qty 2

## 2021-03-25 MED ORDER — SODIUM CHLORIDE 0.9 % IV SOLN
150.0000 mg | Freq: Once | INTRAVENOUS | Status: AC
  Administered 2021-03-25: 150 mg via INTRAVENOUS
  Filled 2021-03-25: qty 150

## 2021-03-25 MED ORDER — SODIUM CHLORIDE 0.9 % IV SOLN
750.0000 mg/m2 | Freq: Once | INTRAVENOUS | Status: AC
  Administered 2021-03-25: 1900 mg via INTRAVENOUS
  Filled 2021-03-25: qty 95

## 2021-03-25 MED ORDER — HEPARIN SOD (PORK) LOCK FLUSH 100 UNIT/ML IV SOLN
500.0000 [IU] | Freq: Once | INTRAVENOUS | Status: AC | PRN
  Administered 2021-03-25: 500 [IU]
  Filled 2021-03-25: qty 5

## 2021-03-25 NOTE — Progress Notes (Signed)
[facility] [phone]   [phone]  PROGRESS NOTE  Patient Care Team: [employer], MD as PCP - General (Internal Medicine)  Hematological/Oncological History # Diffuse Large B Cell Lymphoma, Stage IV 12/17/2020: CT scan at [facility], results show left inguinal adenopathy and multiple solid splenic lesions probably of lymphoid origin  12/26/2020: excisional biopsy of left inguinal lymph node shows DLBCL.  01/04/2021: establish care with Dr. Lorenso Courier  01/09/2021: PET CT scan shows multiple hypermetabolic splenic lesions compatible with lymphoma.  There are also 2 hypermetabolic liver lesions also likely related to lymphoma.  No evidence of hypermetabolic activity in the neck, chest, abdomen or pelvis elsewhere. 01/21/2021: Cycle 1 Day 1 of R-CHOP chemotherapy 02/11/2021: Cycle 2 Day 1 of R-CHOP chemotherapy 03/04/2021: Cycle 3 Day 1 of R-CHOP chemotherapy 03/20/2021: PET CT scan shows excellent response, with resolution of splenic/liver lesions and only Deauville 1 and 2 lesions remaining elsewhere.  03/25/2021: Cycle 4 Day 1 of R-CHOP chemotherapy  Interval History:  Steve Smith 60 y.o. male with medical history significant for Stage IV DLBCL who presents for a follow up visit. The patient's last visit was on 03/13/2021 for an urgent visit due to skin lesions/fever. In the interim since the last visit Steve Smith had a PET CT scan which showed an excellent response to therapy.   On exam today Steve Smith is accompanied by his wife.  He reports he has been well in the interim since our last visit.  He has had resolution of his sinus issues and the skin lesions have subsequently resolved.  He is due for his last day of doxycycline tomorrow.  He is also not had any fever since he started p.o. antibiotic therapy.  He knows of these lesions that appear in areas where he tends to have sweat glands and that due to poor circulation in his workplace he "sweats a lot".  He  notes otherwise his appetite has been good and he denies any fevers, chills, sweats, nausea, vomiting or diarrhea.  Otherwise he has had no focal symptoms, increased lymphadenopathy, or other concerning findings.  A full 10 point ROS is listed below.  MEDICAL HISTORY:  Past Medical History:  Diagnosis Date   Basal cell carcinoma    Hypertension    Kidney stone     SURGICAL HISTORY: Past Surgical History:  Procedure Laterality Date   INGUINAL LYMPH NODE BIOPSY Left 12/26/2020   Procedure: LEFT INGUINAL EXCISIONAL  LYMPH NODE BIOPSY;  Surgeon: Virl Cagey, MD;  Location: [facility];  Service: General;  Laterality: Left;   IR IMAGING GUIDED PORT INSERTION  01/17/2021   ROTATOR CUFF REPAIR     Left    SOCIAL HISTORY: Social History   Socioeconomic History   Marital status: Married    Spouse name: Not on file   Number of children: Not on file   Years of education: Not on file   Highest education level: Not on file  Occupational History   Not on file  Tobacco Use   Smoking status: Never   Smokeless tobacco: Never  Substance and Sexual Activity   Alcohol use: No   Drug use: No   Sexual activity: Not on file  Other Topics Concern   Not on file  Social History Narrative   Not on file   Social Determinants of Health   Financial Resource Strain: Not on file  Food Insecurity: Not on file  Transportation Needs: Not on file  Physical Activity: Not on  file  Stress: Not on file  Social Connections: Not on file  Intimate Partner Violence: Not on file    FAMILY HISTORY: Family History  Problem Relation Age of Onset   Basal cell carcinoma Mother    Liver cancer Father     ALLERGIES:  has No Known Allergies.  MEDICATIONS:  Current Outpatient Medications  Medication Sig Dispense Refill   allopurinol (ZYLOPRIM) 300 MG tablet Take 1 tablet (300 mg total) by mouth daily. 90 tablet 1   doxycycline (VIBRA-TABS) 100 MG tablet Take 1 tablet (100 mg total) by mouth 2 (two)  times daily. 28 tablet 0   lidocaine-prilocaine (EMLA) cream Apply 1 application topically as needed. 30 g 0   olmesartan (BENICAR) 20 MG tablet Take 40 mg by mouth daily.     ondansetron (ZOFRAN) 4 MG tablet Take 1 tablet (4 mg total) by mouth every 8 (eight) hours as needed. 30 tablet 0   pantoprazole (PROTONIX) 40 MG tablet Take 1 tablet (40 mg total) by mouth daily. 90 each 1   predniSONE (DELTASONE) 20 MG tablet Take 3 tablets (60 mg total) by mouth as directed. Take 3 tablets in the morning on Day 1 of chemotherapy. Take 3 tablets in the morning for the next 4 days. 15 tablet 5   prochlorperazine (COMPAZINE) 10 MG tablet Take 1 tablet (10 mg total) by mouth every 6 (six) hours as needed for nausea or vomiting. 30 tablet 0   No current facility-administered medications for this visit.   Facility-Administered Medications Ordered in Other Visits  Medication Dose Route Frequency Provider Last Rate Last Admin   fludeoxyglucose F - 18 (FDG) injection 11.5 millicurie  72.6 millicurie Intravenous Once PRN Richardean Sale, MD        REVIEW OF SYSTEMS:   Constitutional: ( - ) fevers, ( - )  chills , ( - ) night sweats Eyes: ( - ) blurriness of vision, ( - ) double vision, ( - ) watery eyes Ears, nose, mouth, throat, and face: ( - ) mucositis, ( - ) sore throat Respiratory: ( - ) cough, ( - ) dyspnea, ( - ) wheezes Cardiovascular: ( - ) palpitation, ( - ) chest discomfort, ( - ) lower extremity swelling Gastrointestinal:  ( - ) nausea, ( - ) heartburn, ( - ) change in bowel habits Skin: ( - ) abnormal skin rashes Lymphatics: ( - ) new lymphadenopathy, ( - ) easy bruising Neurological: ( - ) numbness, ( - ) tingling, ( - ) new weaknesses Behavioral/Psych: ( - ) mood change, ( - ) new changes  All other systems were reviewed with the patient and are negative.  PHYSICAL EXAMINATION: ECOG PERFORMANCE STATUS: 1 - Symptomatic but completely ambulatory  There were no vitals filed for this  visit.  There were no vitals filed for this visit.   GENERAL: well appearing middle aged Caucasian male. alert, no distress and comfortable SKIN: small pustules scattered across multiple body regions. skin color, texture, turgor are normal, no rashes or significant lesions EYES: conjunctiva are pink and non-injected, sclera clear LUNGS: clear to auscultation and percussion with normal breathing effort HEART: regular rate & rhythm and no murmurs and no lower extremity edema Musculoskeletal: no cyanosis of digits and no clubbing  PSYCH: alert & oriented x 3, fluent speech NEURO: no focal motor/sensory deficits  LABORATORY DATA:  I have reviewed the data as listed CBC Latest Ref Rng & Units 03/13/2021 03/04/2021 02/11/2021  WBC 4.0 - 10.5 K/uL 1.9(L)  5.2 5.3  Hemoglobin 13.0 - 17.0 g/dL 11.8(L) 12.6(L) 13.8  Hematocrit 39.0 - 52.0 % 34.5(L) 36.4(L) 40.1  Platelets 150 - 400 K/uL 54(L) 302 358    CMP Latest Ref Rng & Units 03/13/2021 03/04/2021 02/11/2021  Glucose 70 - 99 mg/dL 153(H) 142(H) 134(H)  BUN 6 - 20 mg/dL 17 19 11   Creatinine 0.61 - 1.24 mg/dL 1.07 1.12 1.18  Sodium 135 - 145 mmol/L 135 139 138  Potassium 3.5 - 5.1 mmol/L 4.2 4.1 4.5  Chloride 98 - 111 mmol/L 103 105 105  CO2 22 - 32 mmol/L 22 24 27   Calcium 8.9 - 10.3 mg/dL 9.5 9.6 9.4  Total Protein 6.5 - 8.1 g/dL 6.8 6.5 6.4(L)  Total Bilirubin 0.3 - 1.2 mg/dL 0.6 0.2(L) 0.4  Alkaline Phos 38 - 126 U/L 73 61 69  AST 15 - 41 U/L 11(L) 22 19  ALT 0 - 44 U/L 16 16 19     RADIOGRAPHIC STUDIES: I have personally reviewed the radiological images as listed and agreed with the findings in the report: Hypermetabolic lesions in the spleen and liver consistent with metastatic disease.  NM PET Image Restag (PS) Skull Base To Thigh  Result Date: 03/20/2021 CLINICAL DATA:  Subsequent treatment strategy for diffuse large B-cell lymphoma, assess treatment response in a 60 year old male. EXAM: NUCLEAR MEDICINE PET SKULL BASE TO THIGH  TECHNIQUE: 13.2 mCi F-18 FDG was injected intravenously. Full-ring PET imaging was performed from the skull base to thigh after the radiotracer. CT data was obtained and used for attenuation correction and anatomic localization. Fasting blood glucose: 110 mg/dl COMPARISON:  January 09, 2021. FINDINGS: Mediastinal blood pool activity: SUV max 2.52 Liver activity: SUV max 3.76 NECK: No hypermetabolic lymph nodes in the neck. Incidental CT findings: none CHEST: No hypermetabolic mediastinal or hilar nodes. No suspicious pulmonary nodules on the CT scan. Incidental CT findings: RIGHT-sided Port-A-Cath in situ. This terminates at the caval to atrial junction heart size mildly enlarged without pericardial effusion. Normal caliber central pulmonary vessels. Limited assessment of cardiovascular structures given lack of intravenous contrast. No adenopathy by size criteria in the chest. Basilar atelectasis. Calcified granulomata in the chest. No consolidation or pleural effusion. ABDOMEN/PELVIS: Resolution of activity in the liver and spleen. Mildly heterogeneous hepatic uptake without discrete lesion. Spleen is normal size without focal uptake on today's scan. SUV in the spleen approximately 3.6 for maximum SUV slightly less than liver uptake. No new adenopathy in the abdomen or pelvis. Mild uptake in the area of LEFT inguinal resection with decreased size of presumed seroma in this location, maximum SUV of 3.4 likely related to granulation tissue. RIGHT inguinal lymph node maximum SUV of 2.4 measuring 9 mm, not changed from previous imaging (image 207/4) heterogeneous prostate uptake. No focal area of concern on today's evaluation. Diffuse uptake in the bilateral testes slightly increased from previous imaging, nonspecific. Incidental CT findings: none SKELETON: Diffuse mild marrow uptake without focal area of increased metabolic activity. Incidental CT findings: none IMPRESSION: Resolution of areas of uptake in liver and  spleen. Difficult to assign Deauville criteria due to background organ uptake, normal appearance on CT imaging argues for Deauville category 1 or 2. RIGHT inguinal uptake likely reactive, attention on follow-up. Bilateral testicular uptake slightly increased from previous imaging. Suggest attention on follow-up in this patient with history of lymphoma Signs of previous RIGHT inguinal lymph node dissection. Electronically Signed   By: Zetta Bills M.D.   On: 03/20/2021 16:05    ASSESSMENT & PLAN  Steve Smith 60 y.o. male with medical history significant for Stage IV DLBCL who presents for a follow up visit.  After review the labs, the records, schedule the patient the findings most consistent with a diffuse large B-cell lymphoma with staging in process.  We completed the staging with a PET CT scan which showed involvement of the liver and spleen.  At this time his disease does appear as late stage and we will plan for at least 6 cycles of R-CHOP followed by repeat scans.   Previously we discussed the risks and benefits of R-CHOP chemotherapy.  We noted the nature of diffuse large B-cell lymphoma and the possibility of achieving a complete remission with this chemotherapy regimen.  I also noted the side effects which include but are not limited to nausea, vomiting, hair loss, diarrhea, sedation, insomnia, hyperglycemia, high blood pressure, and cardiotoxicity.  The patient voices understanding of the risk and benefits and was agreeable to proceeding with treatment at this time.   The R-CHOP regimen consists of rituximab 375 mg per metered squared IV on day 1, cyclophosphamide 750 mg per metered squared on day 1, doxorubicin 50 mg/m on day 1, vincristine 1.4 mg per metered squared with a max dose of 2 mg on day 1, and prednisone 100 mg orally days 1 through 5.  A cycle lasts for 21 days and number of cycles planned is 6.   IPI Score: 3 points. (53% progression free survival).   # Rash, resolved   #Fever, resolved  -- lesions on resolving --last dose of doxycyline tomorrow --No other focal symptoms.  Patient is not neutropenic and therefore there is no indication for admission and IV antibiotics. --Strict return precautions for worsening fever or new focal symptoms.  #Diffuse Large B Cell Lymphoma, Stage IV --Findings at this time are most consistent with a Stage IV diffuse large B-cell lymphoma  He is Stage IV, will require 6 cycles of therapy.  --Baseline labs to include CBC, CMP, uric acid and LDH. --today is Cycle 4 Day 1 of R-CHOP --Have the patient return to clinic for Cycle 5 Day 1 on 04/17/2021   #Supportive Care --chemotherapy education completed --port in place.  --zofran 8mg  q8H PRN and compazine 10mg  PO q6H for nausea --port placement to be scheduled. -- allopurinol 300mg  PO daily for TLS prophylaxis -- EMLA cream for port -- no pain medication required at this time.   No orders of the defined types were placed in this encounter.   All questions were answered. The patient knows to call the clinic with any problems, questions or concerns.  A total of more than 30 minutes were spent on this encounter and over half of that time was spent on counseling and coordination of care as outlined above.   Ledell Peoples, MD Department of Hematology/Oncology [place] at [facility] Phone: [phone] Pager: [phone] Email: [email or web address]  03/25/2021 7:22 AM

## 2021-03-25 NOTE — Patient Instructions (Signed)
[facility]   Discharge Instructions: Thank you for choosing [facility] to provide your oncology and hematology care.   If you have a lab appointment with the [facility], please go directly to the [facility] and check in at the registration area.   Wear comfortable clothing and clothing appropriate for easy access to any Portacath or PICC line.   We strive to give you quality time with your provider. You may need to reschedule your appointment if you arrive late (15 or more minutes).  Arriving late affects you and other patients whose appointments are after yours.  Also, if you miss three or more appointments without notifying the office, you may be dismissed from the clinic at the provider's discretion.      For prescription refill requests, have your pharmacy contact our office and allow 72 hours for refills to be completed.    Today you received the following chemotherapy and/or immunotherapy agents: Doxorubicin (Adriamycin), Vincristine, Cyclophosphamide (Cytoxan), and Rituximab (Rituxan)      To help prevent nausea and vomiting after your treatment, we encourage you to take your nausea medication as directed.  BELOW ARE SYMPTOMS THAT SHOULD BE REPORTED IMMEDIATELY: *FEVER GREATER THAN 100.4 F (38 C) OR HIGHER *CHILLS OR SWEATING *NAUSEA AND VOMITING THAT IS NOT CONTROLLED WITH YOUR NAUSEA MEDICATION *UNUSUAL SHORTNESS OF BREATH *UNUSUAL BRUISING OR BLEEDING *URINARY PROBLEMS (pain or burning when urinating, or frequent urination) *BOWEL PROBLEMS (unusual diarrhea, constipation, pain near the anus) TENDERNESS IN MOUTH AND THROAT WITH OR WITHOUT PRESENCE OF ULCERS (sore throat, sores in mouth, or a toothache) UNUSUAL RASH, SWELLING OR PAIN  UNUSUAL VAGINAL DISCHARGE OR ITCHING   Items with * indicate a potential emergency and should be followed up as soon as possible or go to the Emergency Department if any problems should  occur.  Please show the CHEMOTHERAPY ALERT CARD or IMMUNOTHERAPY ALERT CARD at check-in to the Emergency Department and triage nurse.  Should you have questions after your visit or need to cancel or reschedule your appointment, please contact [facility]  Dept: [phone]  and follow the prompts.  Office hours are 8:00 a.m. to 4:30 p.m. Monday - Friday. Please note that voicemails left after 4:00 p.m. may not be returned until the following business day.  We are closed weekends and major holidays. You have access to a nurse at all times for urgent questions. Please call the main number to the clinic Dept: [phone] and follow the prompts.   For any non-urgent questions, you may also contact your provider using MyChart. We now offer e-Visits for anyone 53 and older to request care online for non-urgent symptoms. For details visit [email or web address].   Also download the MyChart app! Go to the app store, search "MyChart", open the app, select [facility], and log in with your MyChart username and password.  Due to Covid, a mask is required upon entering the hospital/clinic. If you do not have a mask, one will be given to you upon arrival. For doctor visits, patients may have 1 support person aged 32 or older with them. For treatment visits, patients cannot have anyone with them due to current Covid guidelines and our immunocompromised population.

## 2021-03-27 ENCOUNTER — Ambulatory Visit: Payer: 59

## 2021-04-17 ENCOUNTER — Encounter: Payer: Self-pay | Admitting: Hematology and Oncology

## 2021-04-17 ENCOUNTER — Inpatient Hospital Stay: Payer: 59

## 2021-04-17 ENCOUNTER — Inpatient Hospital Stay: Payer: 59 | Attending: Hematology and Oncology | Admitting: Hematology and Oncology

## 2021-04-17 ENCOUNTER — Other Ambulatory Visit: Payer: Self-pay

## 2021-04-17 VITALS — BP 145/91 | HR 100 | Temp 97.9°F | Resp 18 | Ht 75.0 in | Wt 266.8 lb

## 2021-04-17 VITALS — HR 83

## 2021-04-17 DIAGNOSIS — Z95828 Presence of other vascular implants and grafts: Secondary | ICD-10-CM

## 2021-04-17 DIAGNOSIS — Z7952 Long term (current) use of systemic steroids: Secondary | ICD-10-CM | POA: Diagnosis not present

## 2021-04-17 DIAGNOSIS — Z79899 Other long term (current) drug therapy: Secondary | ICD-10-CM | POA: Insufficient documentation

## 2021-04-17 DIAGNOSIS — C8338 Diffuse large B-cell lymphoma, lymph nodes of multiple sites: Secondary | ICD-10-CM | POA: Diagnosis present

## 2021-04-17 DIAGNOSIS — Z5112 Encounter for antineoplastic immunotherapy: Secondary | ICD-10-CM | POA: Diagnosis present

## 2021-04-17 DIAGNOSIS — C8335 Diffuse large B-cell lymphoma, lymph nodes of inguinal region and lower limb: Secondary | ICD-10-CM

## 2021-04-17 DIAGNOSIS — I10 Essential (primary) hypertension: Secondary | ICD-10-CM | POA: Insufficient documentation

## 2021-04-17 DIAGNOSIS — Z5189 Encounter for other specified aftercare: Secondary | ICD-10-CM | POA: Insufficient documentation

## 2021-04-17 DIAGNOSIS — Z85828 Personal history of other malignant neoplasm of skin: Secondary | ICD-10-CM | POA: Diagnosis not present

## 2021-04-17 DIAGNOSIS — Z5111 Encounter for antineoplastic chemotherapy: Secondary | ICD-10-CM

## 2021-04-17 LAB — CBC WITH DIFFERENTIAL (CANCER CENTER ONLY)
Abs Immature Granulocytes: 0.02 10*3/uL (ref 0.00–0.07)
Basophils Absolute: 0.1 10*3/uL (ref 0.0–0.1)
Basophils Relative: 1 %
Eosinophils Absolute: 0.1 10*3/uL (ref 0.0–0.5)
Eosinophils Relative: 2 %
HCT: 31.9 % — ABNORMAL LOW (ref 39.0–52.0)
Hemoglobin: 10.6 g/dL — ABNORMAL LOW (ref 13.0–17.0)
Immature Granulocytes: 1 %
Lymphocytes Relative: 12 %
Lymphs Abs: 0.5 10*3/uL — ABNORMAL LOW (ref 0.7–4.0)
MCH: 32.5 pg (ref 26.0–34.0)
MCHC: 33.2 g/dL (ref 30.0–36.0)
MCV: 97.9 fL (ref 80.0–100.0)
Monocytes Absolute: 0.6 10*3/uL (ref 0.1–1.0)
Monocytes Relative: 15 %
Neutro Abs: 2.7 10*3/uL (ref 1.7–7.7)
Neutrophils Relative %: 69 %
Platelet Count: 261 10*3/uL (ref 150–400)
RBC: 3.26 MIL/uL — ABNORMAL LOW (ref 4.22–5.81)
RDW: 18.3 % — ABNORMAL HIGH (ref 11.5–15.5)
WBC Count: 3.9 10*3/uL — ABNORMAL LOW (ref 4.0–10.5)
nRBC: 0 % (ref 0.0–0.2)

## 2021-04-17 LAB — CMP (CANCER CENTER ONLY)
ALT: 15 U/L (ref 0–44)
AST: 21 U/L (ref 15–41)
Albumin: 3.6 g/dL (ref 3.5–5.0)
Alkaline Phosphatase: 63 U/L (ref 38–126)
Anion gap: 7 (ref 5–15)
BUN: 12 mg/dL (ref 6–20)
CO2: 29 mmol/L (ref 22–32)
Calcium: 9.4 mg/dL (ref 8.9–10.3)
Chloride: 105 mmol/L (ref 98–111)
Creatinine: 1.19 mg/dL (ref 0.61–1.24)
GFR, Estimated: >60 mL/min (ref >60)
Glucose, Bld: 146 mg/dL — ABNORMAL HIGH (ref 70–99)
Potassium: 4 mmol/L (ref 3.5–5.1)
Sodium: 141 mmol/L (ref 135–145)
Total Bilirubin: 0.5 mg/dL (ref 0.3–1.2)
Total Protein: 6.1 g/dL — ABNORMAL LOW (ref 6.5–8.1)

## 2021-04-17 LAB — LACTATE DEHYDROGENASE: LDH: 190 U/L (ref 98–192)

## 2021-04-17 LAB — URIC ACID: Uric Acid, Serum: 3.4 mg/dL — ABNORMAL LOW (ref 3.7–8.6)

## 2021-04-17 MED ORDER — HEPARIN SOD (PORK) LOCK FLUSH 100 UNIT/ML IV SOLN
500.0000 [IU] | Freq: Once | INTRAVENOUS | Status: AC | PRN
  Administered 2021-04-17: 500 [IU]
  Filled 2021-04-17: qty 5

## 2021-04-17 MED ORDER — PEGFILGRASTIM 6 MG/0.6ML ~~LOC~~ PSKT
PREFILLED_SYRINGE | SUBCUTANEOUS | Status: AC
  Filled 2021-04-17: qty 0.6

## 2021-04-17 MED ORDER — DIPHENHYDRAMINE HCL 25 MG PO CAPS
50.0000 mg | ORAL_CAPSULE | Freq: Once | ORAL | Status: AC
Start: 2021-04-17 — End: 2021-04-17
  Administered 2021-04-17: 50 mg via ORAL

## 2021-04-17 MED ORDER — PALONOSETRON HCL INJECTION 0.25 MG/5ML
0.2500 mg | Freq: Once | INTRAVENOUS | Status: AC
  Administered 2021-04-17: 0.25 mg via INTRAVENOUS

## 2021-04-17 MED ORDER — DIPHENHYDRAMINE HCL 50 MG/ML IJ SOLN
INTRAMUSCULAR | Status: AC
  Filled 2021-04-17: qty 1

## 2021-04-17 MED ORDER — SODIUM CHLORIDE 0.9 % IV SOLN
150.0000 mg | Freq: Once | INTRAVENOUS | Status: AC
  Administered 2021-04-17: 150 mg via INTRAVENOUS
  Filled 2021-04-17: qty 150

## 2021-04-17 MED ORDER — VINCRISTINE SULFATE CHEMO INJECTION 1 MG/ML
2.0000 mg | Freq: Once | INTRAVENOUS | Status: AC
  Administered 2021-04-17: 2 mg via INTRAVENOUS
  Filled 2021-04-17: qty 2

## 2021-04-17 MED ORDER — ACETAMINOPHEN 325 MG PO TABS
650.0000 mg | ORAL_TABLET | Freq: Once | ORAL | Status: AC
Start: 2021-04-17 — End: 2021-04-17
  Administered 2021-04-17: 650 mg via ORAL

## 2021-04-17 MED ORDER — ACETAMINOPHEN 325 MG PO TABS
ORAL_TABLET | ORAL | Status: AC
  Filled 2021-04-17: qty 2

## 2021-04-17 MED ORDER — SODIUM CHLORIDE 0.9 % IV SOLN
10.0000 mg | Freq: Once | INTRAVENOUS | Status: AC
  Administered 2021-04-17: 10 mg via INTRAVENOUS
  Filled 2021-04-17: qty 10

## 2021-04-17 MED ORDER — PALONOSETRON HCL INJECTION 0.25 MG/5ML
INTRAVENOUS | Status: AC
  Filled 2021-04-17: qty 5

## 2021-04-17 MED ORDER — PEGFILGRASTIM 6 MG/0.6ML ~~LOC~~ PSKT
6.0000 mg | PREFILLED_SYRINGE | Freq: Once | SUBCUTANEOUS | Status: AC
  Administered 2021-04-17: 6 mg via SUBCUTANEOUS

## 2021-04-17 MED ORDER — DOXORUBICIN HCL CHEMO IV INJECTION 2 MG/ML
50.0000 mg/m2 | Freq: Once | INTRAVENOUS | Status: AC
  Administered 2021-04-17: 126 mg via INTRAVENOUS
  Filled 2021-04-17: qty 63

## 2021-04-17 MED ORDER — CYCLOPHOSPHAMIDE CHEMO INJECTION 1 GM
750.0000 mg/m2 | Freq: Once | INTRAMUSCULAR | Status: AC
  Administered 2021-04-17: 1900 mg via INTRAVENOUS
  Filled 2021-04-17: qty 95

## 2021-04-17 MED ORDER — SODIUM CHLORIDE 0.9% FLUSH
10.0000 mL | INTRAVENOUS | Status: DC | PRN
  Administered 2021-04-17: 10 mL
  Filled 2021-04-17: qty 10

## 2021-04-17 MED ORDER — RITUXIMAB-PVVR CHEMO 500 MG/50ML IV SOLN
375.0000 mg/m2 | Freq: Once | INTRAVENOUS | Status: AC
  Administered 2021-04-17: 900 mg via INTRAVENOUS
  Filled 2021-04-17: qty 50

## 2021-04-17 MED ORDER — SODIUM CHLORIDE 0.9 % IV SOLN
Freq: Once | INTRAVENOUS | Status: AC
  Filled 2021-04-17: qty 250

## 2021-04-17 MED ORDER — SODIUM CHLORIDE 0.9% FLUSH
10.0000 mL | Freq: Once | INTRAVENOUS | Status: AC
  Administered 2021-04-17: 10 mL
  Filled 2021-04-17: qty 10

## 2021-04-17 NOTE — Progress Notes (Signed)
[place] [phone]   [phone]  PROGRESS NOTE  Patient Care Team: [employer]mond [employer], MD as PCP - General (Internal Medicine)  Hematological/Oncological History # Diffuse Large B Cell Lymphoma, Stage IV 12/17/2020: CT scan at [facility], results show left inguinal adenopathy and multiple solid splenic lesions probably of lymphoid origin  12/26/2020: excisional biopsy of left inguinal lymph node shows DLBCL.  01/04/2021: establish care with Dr. Lorenso Courier  01/09/2021: PET CT scan shows multiple hypermetabolic splenic lesions compatible with lymphoma.  There are also 2 hypermetabolic liver lesions also likely related to lymphoma.  No evidence of hypermetabolic activity in the neck, chest, abdomen or pelvis elsewhere. 01/21/2021: Cycle 1 Day 1 of R-CHOP chemotherapy 02/11/2021: Cycle 2 Day 1 of R-CHOP chemotherapy 03/04/2021: Cycle 3 Day 1 of R-CHOP chemotherapy 03/20/2021: PET CT scan shows excellent response, with resolution of splenic/liver lesions and only Deauville 1 and 2 lesions remaining elsewhere.  03/25/2021: Cycle 4 Day 1 of R-CHOP chemotherapy 04/17/2021: Cycle 5 Day 1 of R-CHOP chemotherapy  Interval History:  Steve Smith 60 y.o. male with medical history significant for Stage IV DLBCL who presents for a follow up visit. The patient's last visit was on 03/25/2021 for Cycle 4. In the interim since the last visit Steve Smith has continued to tolerate therapy well.  On exam today Steve Smith is accompanied by his wife.  He reports he has had a rougher time with his last cycle of chemotherapy.  He notes that he was fatigued until Sunday but he bounced back and currently feels quite good.  He notes that his blood pressure is elevated above its baseline.  He is having predominantly fatigue with no other major symptoms.  He notes otherwise his appetite has been good and he denies any fevers, chills, sweats, nausea, vomiting or diarrhea.  Otherwise he has had no focal  symptoms, increased lymphadenopathy, or other concerning findings.  A full 10 point ROS is listed below.  MEDICAL HISTORY:  Past Medical History:  Diagnosis Date   Basal cell carcinoma    Hypertension    Kidney stone     SURGICAL HISTORY: Past Surgical History:  Procedure Laterality Date   INGUINAL LYMPH NODE BIOPSY Left 12/26/2020   Procedure: LEFT INGUINAL EXCISIONAL  LYMPH NODE BIOPSY;  Surgeon: Virl Cagey, MD;  Location: [facility];  Service: General;  Laterality: Left;   IR IMAGING GUIDED PORT INSERTION  01/17/2021   ROTATOR CUFF REPAIR     Left    SOCIAL HISTORY: Social History   Socioeconomic History   Marital status: Married    Spouse name: Not on file   Number of children: Not on file   Years of education: Not on file   Highest education level: Not on file  Occupational History   Not on file  Tobacco Use   Smoking status: Never   Smokeless tobacco: Never  Substance and Sexual Activity   Alcohol use: No   Drug use: No   Sexual activity: Not on file  Other Topics Concern   Not on file  Social History Narrative   Not on file   Social Determinants of Health   Financial Resource Strain: Not on file  Food Insecurity: Not on file  Transportation Needs: Not on file  Physical Activity: Not on file  Stress: Not on file  Social Connections: Not on file  Intimate Partner Violence: Not on file    FAMILY HISTORY: Family History  Problem Relation Age of Onset  Basal cell carcinoma Mother    Liver cancer Father     ALLERGIES:  has No Known Allergies.  MEDICATIONS:  Current Outpatient Medications  Medication Sig Dispense Refill   olmesartan (BENICAR) 40 MG tablet Take 40 mg by mouth daily.     allopurinol (ZYLOPRIM) 300 MG tablet Take 1 tablet (300 mg total) by mouth daily. 90 tablet 1   lidocaine-prilocaine (EMLA) cream Apply 1 application topically as needed. 30 g 0   ondansetron (ZOFRAN) 4 MG tablet Take 1 tablet (4 mg total) by mouth every 8 (eight)  hours as needed. 30 tablet 0   pantoprazole (PROTONIX) 40 MG tablet Take 1 tablet (40 mg total) by mouth daily. 90 each 1   predniSONE (DELTASONE) 20 MG tablet Take 3 tablets (60 mg total) by mouth as directed. Take 3 tablets in the morning on Day 1 of chemotherapy. Take 3 tablets in the morning for the next 4 days. 15 tablet 5   prochlorperazine (COMPAZINE) 10 MG tablet Take 1 tablet (10 mg total) by mouth every 6 (six) hours as needed for nausea or vomiting. 30 tablet 0   No current facility-administered medications for this visit.    REVIEW OF SYSTEMS:   Constitutional: ( - ) fevers, ( - )  chills , ( - ) night sweats Eyes: ( - ) blurriness of vision, ( - ) double vision, ( - ) watery eyes Ears, nose, mouth, throat, and face: ( - ) mucositis, ( - ) sore throat Respiratory: ( - ) cough, ( - ) dyspnea, ( - ) wheezes Cardiovascular: ( - ) palpitation, ( - ) chest discomfort, ( - ) lower extremity swelling Gastrointestinal:  ( - ) nausea, ( - ) heartburn, ( - ) change in bowel habits Skin: ( - ) abnormal skin rashes Lymphatics: ( - ) new lymphadenopathy, ( - ) easy bruising Neurological: ( - ) numbness, ( - ) tingling, ( - ) new weaknesses Behavioral/Psych: ( - ) mood change, ( - ) new changes  All other systems were reviewed with the patient and are negative.  PHYSICAL EXAMINATION: ECOG PERFORMANCE STATUS: 1 - Symptomatic but completely ambulatory  Vitals:   04/17/21 1050 04/17/21 1051  BP: (!) 153/96 (!) 145/91  Pulse: 100   Resp: 18   Temp: 97.9 F (36.6 C)   SpO2: 99%     Filed Weights   04/17/21 1050  Weight: 266 lb 12.8 oz (121 kg)     GENERAL: well appearing middle aged Caucasian male. alert, no distress and comfortable SKIN: small pustules scattered across multiple body regions. skin color, texture, turgor are normal, no rashes or significant lesions EYES: conjunctiva are pink and non-injected, sclera clear LUNGS: clear to auscultation and percussion with normal  breathing effort HEART: regular rate & rhythm and no murmurs and no lower extremity edema Musculoskeletal: no cyanosis of digits and no clubbing  PSYCH: alert & oriented x 3, fluent speech NEURO: no focal motor/sensory deficits  LABORATORY DATA:  I have reviewed the data as listed CBC Latest Ref Rng & Units 04/17/2021 03/25/2021 03/13/2021  WBC 4.0 - 10.5 K/uL 3.9(L) 3.9(L) 1.9(L)  Hemoglobin 13.0 - 17.0 g/dL 10.6(L) 10.8(L) 11.8(L)  Hematocrit 39.0 - 52.0 % 31.9(L) 32.3(L) 34.5(L)  Platelets 150 - 400 K/uL 261 300 54(L)    CMP Latest Ref Rng & Units 04/17/2021 03/25/2021 03/13/2021  Glucose 70 - 99 mg/dL 146(H) 144(H) 153(H)  BUN 6 - 20 mg/dL 12 12 17   Creatinine 0.61 -  1.24 mg/dL 1.19 1.09 1.07  Sodium 135 - 145 mmol/L 141 139 135  Potassium 3.5 - 5.1 mmol/L 4.0 4.2 4.2  Chloride 98 - 111 mmol/L 105 105 103  CO2 22 - 32 mmol/L 29 25 22   Calcium 8.9 - 10.3 mg/dL 9.4 9.3 9.5  Total Protein 6.5 - 8.1 g/dL 6.1(L) 6.1(L) 6.8  Total Bilirubin 0.3 - 1.2 mg/dL 0.5 0.3 0.6  Alkaline Phos 38 - 126 U/L 63 66 73  AST 15 - 41 U/L 21 18 11(L)  ALT 0 - 44 U/L 15 14 16     RADIOGRAPHIC STUDIES: I have personally reviewed the radiological images as listed and agreed with the findings in the report: Hypermetabolic lesions in the spleen and liver consistent with metastatic disease.  No results found.  ASSESSMENT & PLAN STEVEN BASSO 60 y.o. male with medical history significant for Stage IV DLBCL who presents for a follow up visit.  After review the labs, the records, schedule the patient the findings most consistent with a diffuse large B-cell lymphoma with staging in process.  We completed the staging with a PET CT scan which showed involvement of the liver and spleen.  At this time his disease does appear as late stage and we will plan for at least 6 cycles of R-CHOP followed by repeat scans.   Previously we discussed the risks and benefits of R-CHOP chemotherapy.  We noted the nature of diffuse  large B-cell lymphoma and the possibility of achieving a complete remission with this chemotherapy regimen.  I also noted the side effects which include but are not limited to nausea, vomiting, hair loss, diarrhea, sedation, insomnia, hyperglycemia, high blood pressure, and cardiotoxicity.  The patient voices understanding of the risk and benefits and was agreeable to proceeding with treatment at this time.   The R-CHOP regimen consists of rituximab 375 mg per metered squared IV on day 1, cyclophosphamide 750 mg per metered squared on day 1, doxorubicin 50 mg/m on day 1, vincristine 1.4 mg per metered squared with a max dose of 2 mg on day 1, and prednisone 100 mg orally days 1 through 5.  A cycle lasts for 21 days and number of cycles planned is 6.   IPI Score: 3 points. (53% progression free survival).   # Rash, resolved  #Fever, resolved  -- lesions on resolving --last dose of doxycyline tomorrow --No other focal symptoms.  Patient is not neutropenic and therefore there is no indication for admission and IV antibiotics. --Strict return precautions for worsening fever or new focal symptoms.  #Diffuse Large B Cell Lymphoma, Stage IV --Findings at this time are most consistent with a Stage IV diffuse large B-cell lymphoma  He is Stage IV, will require 6 cycles of therapy.  --Baseline labs to include CBC, CMP, uric acid and LDH. --today is Cycle 5 Day 1 of R-CHOP --Have the patient return to clinic for Cycle 6 Day 1 on 05/06/2021  #Chemotherapy Induced Anemia #Chemotherapy Induced Neutropenia -- Hemoglobin today at 10.6.  Trending downward overall. --White blood cell tag a 3.9.  Patient continues to receive G-CSF shots on day 3 of each cycle --Continue to monitor.   #Supportive Care --chemotherapy education completed --port in place.  --zofran 8mg  q8H PRN and compazine 10mg  PO q6H for nausea --port placement to be scheduled. -- allopurinol 300mg  PO daily for TLS prophylaxis -- EMLA cream  for port -- no pain medication required at this time.   No orders of the defined types  were placed in this encounter.   All questions were answered. The patient knows to call the clinic with any problems, questions or concerns.  A total of more than 30 minutes were spent on this encounter and over half of that time was spent on counseling and coordination of care as outlined above.   Ledell Peoples, MD Department of Hematology/Oncology [facility] Phone: [phone] Pager: [phone] Email: [email or web address]  04/21/2021 5:58 PM

## 2021-04-17 NOTE — Patient Instructions (Signed)
[facility]  Discharge Instructions: Thank you for choosing [facility] to provide your oncology and hematology care.   If you have a lab appointment with the [facility], please go directly to the [facility] and check in at the registration area.   Wear comfortable clothing and clothing appropriate for easy access to any Portacath or PICC line.   We strive to give you quality time with your provider. You may need to reschedule your appointment if you arrive late (15 or more minutes).  Arriving late affects you and other patients whose appointments are after yours.  Also, if you miss three or more appointments without notifying the office, you may be dismissed from the clinic at the provider's discretion.      For prescription refill requests, have your pharmacy contact our office and allow 72 hours for refills to be completed.    Today you received the following chemotherapy and/or immunotherapy agents RCHOP (Rituximab, Doxorubicin, Vincristine, Cyclophosphamide) and Pegfilgrastim OBI.      To help prevent nausea and vomiting after your treatment, we encourage you to take your nausea medication as directed.  BELOW ARE SYMPTOMS THAT SHOULD BE REPORTED IMMEDIATELY: *FEVER GREATER THAN 100.4 F (38 C) OR HIGHER *CHILLS OR SWEATING *NAUSEA AND VOMITING THAT IS NOT CONTROLLED WITH YOUR NAUSEA MEDICATION *UNUSUAL SHORTNESS OF BREATH *UNUSUAL BRUISING OR BLEEDING *URINARY PROBLEMS (pain or burning when urinating, or frequent urination) *BOWEL PROBLEMS (unusual diarrhea, constipation, pain near the anus) TENDERNESS IN MOUTH AND THROAT WITH OR WITHOUT PRESENCE OF ULCERS (sore throat, sores in mouth, or a toothache) UNUSUAL RASH, SWELLING OR PAIN  UNUSUAL VAGINAL DISCHARGE OR ITCHING   Items with * indicate a potential emergency and should be followed up as soon as possible or go to the Emergency Department if any problems should occur.  Please  show the CHEMOTHERAPY ALERT CARD or IMMUNOTHERAPY ALERT CARD at check-in to the Emergency Department and triage nurse.  Should you have questions after your visit or need to cancel or reschedule your appointment, please contact [facility]  Dept: [phone]  and follow the prompts.  Office hours are 8:00 a.m. to 4:30 p.m. Monday - Friday. Please note that voicemails left after 4:00 p.m. may not be returned until the following business day.  We are closed weekends and major holidays. You have access to a nurse at all times for urgent questions. Please call the main number to the clinic Dept: [phone] and follow the prompts.   For any non-urgent questions, you may also contact your provider using MyChart. We now offer e-Visits for anyone 70 and older to request care online for non-urgent symptoms. For details visit [email or web address].   Also download the MyChart app! Go to the app store, search "MyChart", open the app, select [facility], and log in with your MyChart username and password.  Due to Covid, a mask is required upon entering the hospital/clinic. If you do not have a mask, one will be given to you upon arrival. For doctor visits, patients may have 1 support person aged 68 or older with them. For treatment visits, patients cannot have anyone with them due to current Covid guidelines and our immunocompromised population.

## 2021-04-17 NOTE — Patient Instructions (Signed)

## 2021-04-19 ENCOUNTER — Ambulatory Visit: Payer: 59

## 2021-04-21 ENCOUNTER — Encounter: Payer: Self-pay | Admitting: Hematology and Oncology

## 2021-04-24 ENCOUNTER — Encounter: Payer: Self-pay | Admitting: Hematology and Oncology

## 2021-04-25 ENCOUNTER — Other Ambulatory Visit: Payer: Self-pay | Admitting: Hematology and Oncology

## 2021-04-25 MED ORDER — DOXYCYCLINE HYCLATE 100 MG PO TABS: 100.0000 mg | ORAL_TABLET | Freq: Two times a day (BID) | ORAL | 0 refills | Status: DC

## 2021-05-06 ENCOUNTER — Inpatient Hospital Stay: Payer: 59 | Attending: Hematology and Oncology | Admitting: Hematology and Oncology

## 2021-05-06 ENCOUNTER — Encounter: Payer: Self-pay | Admitting: Hematology and Oncology

## 2021-05-06 ENCOUNTER — Inpatient Hospital Stay: Payer: 59

## 2021-05-06 ENCOUNTER — Other Ambulatory Visit: Payer: Self-pay

## 2021-05-06 VITALS — BP 124/73 | HR 87 | Temp 98.2°F | Resp 18

## 2021-05-06 VITALS — BP 156/94 | HR 98 | Temp 97.9°F | Resp 18 | Ht 75.0 in | Wt 265.7 lb

## 2021-05-06 DIAGNOSIS — Z5112 Encounter for antineoplastic immunotherapy: Secondary | ICD-10-CM | POA: Insufficient documentation

## 2021-05-06 DIAGNOSIS — Z5111 Encounter for antineoplastic chemotherapy: Secondary | ICD-10-CM | POA: Diagnosis present

## 2021-05-06 DIAGNOSIS — C8335 Diffuse large B-cell lymphoma, lymph nodes of inguinal region and lower limb: Secondary | ICD-10-CM

## 2021-05-06 DIAGNOSIS — C8330 Diffuse large B-cell lymphoma, unspecified site: Secondary | ICD-10-CM | POA: Insufficient documentation

## 2021-05-06 DIAGNOSIS — Z79899 Other long term (current) drug therapy: Secondary | ICD-10-CM | POA: Insufficient documentation

## 2021-05-06 DIAGNOSIS — Z5189 Encounter for other specified aftercare: Secondary | ICD-10-CM | POA: Diagnosis not present

## 2021-05-06 DIAGNOSIS — Z7952 Long term (current) use of systemic steroids: Secondary | ICD-10-CM | POA: Insufficient documentation

## 2021-05-06 DIAGNOSIS — Z95828 Presence of other vascular implants and grafts: Secondary | ICD-10-CM | POA: Diagnosis not present

## 2021-05-06 DIAGNOSIS — T451X5A Adverse effect of antineoplastic and immunosuppressive drugs, initial encounter: Secondary | ICD-10-CM | POA: Diagnosis not present

## 2021-05-06 DIAGNOSIS — D701 Agranulocytosis secondary to cancer chemotherapy: Secondary | ICD-10-CM | POA: Insufficient documentation

## 2021-05-06 DIAGNOSIS — D6481 Anemia due to antineoplastic chemotherapy: Secondary | ICD-10-CM | POA: Insufficient documentation

## 2021-05-06 DIAGNOSIS — I10 Essential (primary) hypertension: Secondary | ICD-10-CM | POA: Insufficient documentation

## 2021-05-06 LAB — CMP (CANCER CENTER ONLY)
ALT: 14 U/L (ref 0–44)
AST: 14 U/L — ABNORMAL LOW (ref 15–41)
Albumin: 3.5 g/dL (ref 3.5–5.0)
Alkaline Phosphatase: 66 U/L (ref 38–126)
Anion gap: 9 (ref 5–15)
BUN: 18 mg/dL (ref 6–20)
CO2: 24 mmol/L (ref 22–32)
Calcium: 9.6 mg/dL (ref 8.9–10.3)
Chloride: 106 mmol/L (ref 98–111)
Creatinine: 1.14 mg/dL (ref 0.61–1.24)
GFR, Estimated: >60 mL/min (ref >60)
Glucose, Bld: 197 mg/dL — ABNORMAL HIGH (ref 70–99)
Potassium: 4.3 mmol/L (ref 3.5–5.1)
Sodium: 139 mmol/L (ref 135–145)
Total Bilirubin: 0.3 mg/dL (ref 0.3–1.2)
Total Protein: 6.3 g/dL — ABNORMAL LOW (ref 6.5–8.1)

## 2021-05-06 LAB — CBC WITH DIFFERENTIAL (CANCER CENTER ONLY)
Abs Immature Granulocytes: 0.05 10*3/uL (ref 0.00–0.07)
Basophils Absolute: 0 10*3/uL (ref 0.0–0.1)
Basophils Relative: 1 %
Eosinophils Absolute: 0 10*3/uL (ref 0.0–0.5)
Eosinophils Relative: 0 %
HCT: 30.6 % — ABNORMAL LOW (ref 39.0–52.0)
Hemoglobin: 10.2 g/dL — ABNORMAL LOW (ref 13.0–17.0)
Immature Granulocytes: 1 %
Lymphocytes Relative: 6 %
Lymphs Abs: 0.2 10*3/uL — ABNORMAL LOW (ref 0.7–4.0)
MCH: 33.1 pg (ref 26.0–34.0)
MCHC: 33.3 g/dL (ref 30.0–36.0)
MCV: 99.4 fL (ref 80.0–100.0)
Monocytes Absolute: 0.1 10*3/uL (ref 0.1–1.0)
Monocytes Relative: 3 %
Neutro Abs: 3.2 10*3/uL (ref 1.7–7.7)
Neutrophils Relative %: 89 %
Platelet Count: 253 10*3/uL (ref 150–400)
RBC: 3.08 MIL/uL — ABNORMAL LOW (ref 4.22–5.81)
RDW: 16.1 % — ABNORMAL HIGH (ref 11.5–15.5)
WBC Count: 3.6 10*3/uL — ABNORMAL LOW (ref 4.0–10.5)
nRBC: 0 % (ref 0.0–0.2)

## 2021-05-06 LAB — LACTATE DEHYDROGENASE: LDH: 216 U/L — ABNORMAL HIGH (ref 98–192)

## 2021-05-06 LAB — URIC ACID: Uric Acid, Serum: 3 mg/dL — ABNORMAL LOW (ref 3.7–8.6)

## 2021-05-06 MED ORDER — ACETAMINOPHEN 325 MG PO TABS
ORAL_TABLET | ORAL | Status: AC
  Filled 2021-05-06: qty 2

## 2021-05-06 MED ORDER — DOXORUBICIN HCL CHEMO IV INJECTION 2 MG/ML
50.0000 mg/m2 | Freq: Once | INTRAVENOUS | Status: AC
  Administered 2021-05-06: 126 mg via INTRAVENOUS
  Filled 2021-05-06: qty 63

## 2021-05-06 MED ORDER — SODIUM CHLORIDE 0.9 % IV SOLN
10.0000 mg | Freq: Once | INTRAVENOUS | Status: AC
  Administered 2021-05-06: 10 mg via INTRAVENOUS
  Filled 2021-05-06: qty 10

## 2021-05-06 MED ORDER — ACETAMINOPHEN 325 MG PO TABS
650.0000 mg | ORAL_TABLET | Freq: Once | ORAL | Status: AC
  Administered 2021-05-06: 650 mg via ORAL

## 2021-05-06 MED ORDER — PEGFILGRASTIM 6 MG/0.6ML ~~LOC~~ PSKT
PREFILLED_SYRINGE | SUBCUTANEOUS | Status: AC
  Filled 2021-05-06: qty 0.6

## 2021-05-06 MED ORDER — DIPHENHYDRAMINE HCL 25 MG PO CAPS
ORAL_CAPSULE | ORAL | Status: AC
  Filled 2021-05-06: qty 2

## 2021-05-06 MED ORDER — PALONOSETRON HCL INJECTION 0.25 MG/5ML
0.2500 mg | Freq: Once | INTRAVENOUS | Status: AC
  Administered 2021-05-06: 0.25 mg via INTRAVENOUS

## 2021-05-06 MED ORDER — DIPHENHYDRAMINE HCL 25 MG PO CAPS
50.0000 mg | ORAL_CAPSULE | Freq: Once | ORAL | Status: AC
  Administered 2021-05-06: 50 mg via ORAL

## 2021-05-06 MED ORDER — SODIUM CHLORIDE 0.9 % IV SOLN
375.0000 mg/m2 | Freq: Once | INTRAVENOUS | Status: AC
  Administered 2021-05-06: 900 mg via INTRAVENOUS
  Filled 2021-05-06: qty 50

## 2021-05-06 MED ORDER — VINCRISTINE SULFATE CHEMO INJECTION 1 MG/ML
2.0000 mg | Freq: Once | INTRAVENOUS | Status: AC
  Administered 2021-05-06: 2 mg via INTRAVENOUS
  Filled 2021-05-06: qty 2

## 2021-05-06 MED ORDER — SODIUM CHLORIDE 0.9 % IV SOLN
150.0000 mg | Freq: Once | INTRAVENOUS | Status: AC
  Administered 2021-05-06: 150 mg via INTRAVENOUS
  Filled 2021-05-06: qty 150

## 2021-05-06 MED ORDER — PALONOSETRON HCL INJECTION 0.25 MG/5ML
INTRAVENOUS | Status: AC
  Filled 2021-05-06: qty 5

## 2021-05-06 MED ORDER — SODIUM CHLORIDE 0.9 % IV SOLN
750.0000 mg/m2 | Freq: Once | INTRAVENOUS | Status: AC
  Administered 2021-05-06: 1900 mg via INTRAVENOUS
  Filled 2021-05-06: qty 95

## 2021-05-06 MED ORDER — PEGFILGRASTIM 6 MG/0.6ML ~~LOC~~ PSKT
6.0000 mg | PREFILLED_SYRINGE | Freq: Once | SUBCUTANEOUS | Status: AC
  Administered 2021-05-06: 6 mg via SUBCUTANEOUS

## 2021-05-06 MED ORDER — HEPARIN SOD (PORK) LOCK FLUSH 100 UNIT/ML IV SOLN
500.0000 [IU] | Freq: Once | INTRAVENOUS | Status: AC | PRN
  Administered 2021-05-06: 500 [IU]
  Filled 2021-05-06: qty 5

## 2021-05-06 MED ORDER — SODIUM CHLORIDE 0.9% FLUSH
10.0000 mL | Freq: Once | INTRAVENOUS | Status: AC
  Administered 2021-05-06: 10 mL
  Filled 2021-05-06: qty 10

## 2021-05-06 MED ORDER — SODIUM CHLORIDE 0.9% FLUSH
10.0000 mL | INTRAVENOUS | Status: DC | PRN
  Administered 2021-05-06: 10 mL
  Filled 2021-05-06: qty 10

## 2021-05-06 MED ORDER — SODIUM CHLORIDE 0.9 % IV SOLN
Freq: Once | INTRAVENOUS | Status: AC
  Filled 2021-05-06: qty 250

## 2021-05-06 NOTE — Progress Notes (Signed)
[facility] [phone]   [phone]  PROGRESS NOTE  Patient Care Team: Redmond [employer], MD as PCP - General (Internal Medicine)  Hematological/Oncological History # Diffuse Large B Cell Lymphoma, Stage IV 12/17/2020: CT scan at [facility], results show left inguinal adenopathy and multiple solid splenic lesions probably of lymphoid origin  12/26/2020: excisional biopsy of left inguinal lymph node shows DLBCL.  01/04/2021: establish care with Dr. Lorenso Courier  01/09/2021: PET CT scan shows multiple hypermetabolic splenic lesions compatible with lymphoma.  There are also 2 hypermetabolic liver lesions also likely related to lymphoma.  No evidence of hypermetabolic activity in the neck, chest, abdomen or pelvis elsewhere. 01/21/2021: Cycle 1 Day 1 of R-CHOP chemotherapy 02/11/2021: Cycle 2 Day 1 of R-CHOP chemotherapy 03/04/2021: Cycle 3 Day 1 of R-CHOP chemotherapy 03/20/2021: PET CT scan shows excellent response, with resolution of splenic/liver lesions and only Deauville 1 and 2 lesions remaining elsewhere.  03/25/2021: Cycle 4 Day 1 of R-CHOP chemotherapy 04/17/2021: Cycle 5 Day 1 of R-CHOP chemotherapy 05/06/2021: Cycle 6 Day 1 of R-CHOP chemotherapy  Interval History:  Steve Smith 60 y.o. male with medical history significant for Stage IV DLBCL who presents for a follow up visit. The patient's last visit was on 04/17/2021 for Cycle 5. In the interim since the last visit Steve Smith has continued to tolerate therapy well.  On exam today Steve Smith is accompanied by Steve Smith.  He reports he tolerated the last cycle much better than the prior 1.  He has been eating plenty of red meat and notes that this has been bolstering Steve energy as well as making him feel better overall.  Steve weight has been steady and he is currently at 265 pounds.  The rash on Steve lower extremities redeveloped but after repeat trial of doxycycline have also cleared.  He notes otherwise Steve appetite has  been good and he denies any fevers, chills, sweats, nausea, vomiting or diarrhea.  Otherwise he has had no focal symptoms, increased lymphadenopathy, or other concerning findings.  A full 10 point ROS is listed below.  MEDICAL HISTORY:  Past Medical History:  Diagnosis Date   Basal cell carcinoma    Hypertension    Kidney stone     SURGICAL HISTORY: Past Surgical History:  Procedure Laterality Date   INGUINAL LYMPH NODE BIOPSY Left 12/26/2020   Procedure: LEFT INGUINAL EXCISIONAL  LYMPH NODE BIOPSY;  Surgeon: Virl Cagey, MD;  Location: [facility];  Service: General;  Laterality: Left;   IR IMAGING GUIDED PORT INSERTION  01/17/2021   ROTATOR CUFF REPAIR     Left    SOCIAL HISTORY: Social History   Socioeconomic History   Marital status: Married    Spouse name: Not on file   Number of children: Not on file   Years of education: Not on file   Highest education level: Not on file  Occupational History   Not on file  Tobacco Use   Smoking status: Never   Smokeless tobacco: Never  Substance and Sexual Activity   Alcohol use: No   Drug use: No   Sexual activity: Not on file  Other Topics Concern   Not on file  Social History Narrative   Not on file   Social Determinants of Health   Financial Resource Strain: Not on file  Food Insecurity: Not on file  Transportation Needs: Not on file  Physical Activity: Not on file  Stress: Not on file  Social Connections: Not on  file  Intimate Partner Violence: Not on file    FAMILY HISTORY: Family History  Problem Relation Age of Onset   Basal cell carcinoma Mother    Liver cancer Father     ALLERGIES:  has No Known Allergies.  MEDICATIONS:  Current Outpatient Medications  Medication Sig Dispense Refill   allopurinol (ZYLOPRIM) 300 MG tablet Take 1 tablet (300 mg total) by mouth daily. 90 tablet 1   doxycycline (VIBRA-TABS) 100 MG tablet Take 1 tablet (100 mg total) by mouth 2 (two) times daily. 28 tablet 0    lidocaine-prilocaine (EMLA) cream Apply 1 application topically as needed. 30 g 0   olmesartan (BENICAR) 40 MG tablet Take 40 mg by mouth daily.     ondansetron (ZOFRAN) 4 MG tablet Take 1 tablet (4 mg total) by mouth every 8 (eight) hours as needed. 30 tablet 0   pantoprazole (PROTONIX) 40 MG tablet Take 1 tablet (40 mg total) by mouth daily. 90 each 1   predniSONE (DELTASONE) 20 MG tablet Take 3 tablets (60 mg total) by mouth as directed. Take 3 tablets in the morning on Day 1 of chemotherapy. Take 3 tablets in the morning for the next 4 days. 15 tablet 5   prochlorperazine (COMPAZINE) 10 MG tablet Take 1 tablet (10 mg total) by mouth every 6 (six) hours as needed for nausea or vomiting. 30 tablet 0   No current facility-administered medications for this visit.    REVIEW OF SYSTEMS:   Constitutional: ( - ) fevers, ( - )  chills , ( - ) night sweats Eyes: ( - ) blurriness of vision, ( - ) double vision, ( - ) watery eyes Ears, nose, mouth, throat, and face: ( - ) mucositis, ( - ) sore throat Respiratory: ( - ) cough, ( - ) dyspnea, ( - ) wheezes Cardiovascular: ( - ) palpitation, ( - ) chest discomfort, ( - ) lower extremity swelling Gastrointestinal:  ( - ) nausea, ( - ) heartburn, ( - ) change in bowel habits Skin: ( - ) abnormal skin rashes Lymphatics: ( - ) new lymphadenopathy, ( - ) easy bruising Neurological: ( - ) numbness, ( - ) tingling, ( - ) new weaknesses Behavioral/Psych: ( - ) mood change, ( - ) new changes  All other systems were reviewed with the patient and are negative.  PHYSICAL EXAMINATION: ECOG PERFORMANCE STATUS: 1 - Symptomatic but completely ambulatory  Vitals:   05/06/21 1014  BP: (!) 156/94  Pulse: 98  Resp: 18  Temp: 97.9 F (36.6 C)  SpO2: 100%    Filed Weights   05/06/21 1014  Weight: 265 lb 11.2 oz (120.5 kg)     GENERAL: well appearing middle aged Caucasian male. alert, no distress and comfortable SKIN: skin color, texture, turgor are normal,  no rashes or significant lesions EYES: conjunctiva are pink and non-injected, sclera clear LUNGS: clear to auscultation and percussion with normal breathing effort HEART: regular rate & rhythm and no murmurs and no lower extremity edema Musculoskeletal: no cyanosis of digits and no clubbing  PSYCH: alert & oriented x 3, fluent speech NEURO: no focal motor/sensory deficits  LABORATORY DATA:  I have reviewed the data as listed CBC Latest Ref Rng & Units 05/06/2021 04/17/2021 03/25/2021  WBC 4.0 - 10.5 K/uL 3.6(L) 3.9(L) 3.9(L)  Hemoglobin 13.0 - 17.0 g/dL 10.2(L) 10.6(L) 10.8(L)  Hematocrit 39.0 - 52.0 % 30.6(L) 31.9(L) 32.3(L)  Platelets 150 - 400 K/uL 253 261 300    CMP  Latest Ref Rng & Units 05/06/2021 04/17/2021 03/25/2021  Glucose 70 - 99 mg/dL 197(H) 146(H) 144(H)  BUN 6 - 20 mg/dL '18 12 12  '$ Creatinine 0.61 - 1.24 mg/dL 1.14 1.19 1.09  Sodium 135 - 145 mmol/L 139 141 139  Potassium 3.5 - 5.1 mmol/L 4.3 4.0 4.2  Chloride 98 - 111 mmol/L 106 105 105  CO2 22 - 32 mmol/L '24 29 25  '$ Calcium 8.9 - 10.3 mg/dL 9.6 9.4 9.3  Total Protein 6.5 - 8.1 g/dL 6.3(L) 6.1(L) 6.1(L)  Total Bilirubin 0.3 - 1.2 mg/dL 0.3 0.5 0.3  Alkaline Phos 38 - 126 U/L 66 63 66  AST 15 - 41 U/L 14(L) 21 18  ALT 0 - 44 U/L '14 15 14    '$ RADIOGRAPHIC STUDIES: No results found.  ASSESSMENT & PLAN Steve Smith 60 y.o. male with medical history significant for Stage IV DLBCL who presents for a follow up visit.  After review the labs, the records, schedule the patient the findings most consistent with a diffuse large B-cell lymphoma with staging in process.  We completed the staging with a PET CT scan which showed involvement of the liver and spleen.  At this time Steve disease does appear as late stage and we will plan for at least 6 cycles of R-CHOP followed by repeat scans.   Previously we discussed the risks and benefits of R-CHOP chemotherapy.  We noted the nature of diffuse large B-cell lymphoma and the possibility  of achieving a complete remission with this chemotherapy regimen.  I also noted the side effects which include but are not limited to nausea, vomiting, hair loss, diarrhea, sedation, insomnia, hyperglycemia, high blood pressure, and cardiotoxicity.  The patient voices understanding of the risk and benefits and was agreeable to proceeding with treatment at this time.   The R-CHOP regimen consists of rituximab 375 mg per metered squared IV on day 1, cyclophosphamide 750 mg per metered squared on day 1, doxorubicin 50 mg/m on day 1, vincristine 1.4 mg per metered squared with a max dose of 2 mg on day 1, and prednisone 100 mg orally days 1 through 5.  A cycle lasts for 21 days and number of cycles planned is 6.   IPI Score: 3 points. (53% progression free survival).   #Diffuse Large B Cell Lymphoma, Stage IV --Findings at this time are most consistent with a Stage IV diffuse large B-cell lymphoma  He is Stage IV, will require 6 cycles of therapy.  --Baseline labs to include CBC, CMP, uric acid and LDH. --today is Cycle 6 Day 1 of R-CHOP --Have the patient return to clinic after repeat PET CT scan in approximately 4 weeks time.   #Chemotherapy Induced Anemia #Chemotherapy Induced Neutropenia -- Hemoglobin today at 10.2.  Trending downward overall. --White blood cell at 3.6, ANC 3.2.  Patient continues to receive G-CSF shots on day 3 of each cycle --Continue to monitor.  # Rash, resolved  #Fever, resolved  -- lesions on resolving --No other focal symptoms.  Patient is not neutropenic and therefore there is no indication for admission and IV antibiotics. --Strict return precautions for worsening fever or new focal symptoms.   #Supportive Care --chemotherapy education completed --port in place.  --zofran '8mg'$  q8H PRN and compazine '10mg'$  PO q6H for nausea --port placement to be scheduled. -- allopurinol '300mg'$  PO daily for TLS prophylaxis. Can d/c this after todays treatment.  -- EMLA cream for  port -- no pain medication required at this time.  Orders Placed This Encounter  Procedures   NM PET Image Restag (PS) Skull Base To Thigh    Standing Status:   Future    Standing Expiration Date:   05/06/2022    Order Specific Question:   If indicated for the ordered procedure, I authorize the administration of a radiopharmaceutical per Radiology protocol    Answer:   Yes    Order Specific Question:   Preferred imaging location?    Answer:   [facility]     All questions were answered. The patient knows to call the clinic with any problems, questions or concerns.  A total of more than 30 minutes were spent on this encounter and over half of that time was spent on counseling and coordination of care as outlined above.   Ledell Peoples, MD Department of Hematology/Oncology [place] at [facility] Phone: [phone] Pager: [phone] Email: [email or web address]  05/06/2021 10:58 AM

## 2021-05-06 NOTE — Patient Instructions (Signed)
[facility] ONCOLOGY  Discharge Instructions: Thank you for choosing [facility] to provide your oncology and hematology care.   If you have a lab appointment with the [facility], please go directly to the [facility] and check in at the registration area.   Wear comfortable clothing and clothing appropriate for easy access to any Portacath or PICC line.   We strive to give you quality time with your provider. You may need to reschedule your appointment if you arrive late (15 or more minutes).  Arriving late affects you and other patients whose appointments are after yours.  Also, if you miss three or more appointments without notifying the office, you may be dismissed from the clinic at the provider's discretion.      For prescription refill requests, have your pharmacy contact our office and allow 72 hours for refills to be completed.    Today you received the following chemotherapy and/or immunotherapy agents doxorubicin, vincristine, cytoxan, rituximab       To help prevent nausea and vomiting after your treatment, we encourage you to take your nausea medication as directed.  BELOW ARE SYMPTOMS THAT SHOULD BE REPORTED IMMEDIATELY: *FEVER GREATER THAN 100.4 F (38 C) OR HIGHER *CHILLS OR SWEATING *NAUSEA AND VOMITING THAT IS NOT CONTROLLED WITH YOUR NAUSEA MEDICATION *UNUSUAL SHORTNESS OF BREATH *UNUSUAL BRUISING OR BLEEDING *URINARY PROBLEMS (pain or burning when urinating, or frequent urination) *BOWEL PROBLEMS (unusual diarrhea, constipation, pain near the anus) TENDERNESS IN MOUTH AND THROAT WITH OR WITHOUT PRESENCE OF ULCERS (sore throat, sores in mouth, or a toothache) UNUSUAL RASH, SWELLING OR PAIN  UNUSUAL VAGINAL DISCHARGE OR ITCHING   Items with * indicate a potential emergency and should be followed up as soon as possible or go to the Emergency Department if any problems should occur.  Please show the CHEMOTHERAPY ALERT CARD or  IMMUNOTHERAPY ALERT CARD at check-in to the Emergency Department and triage nurse.  Should you have questions after your visit or need to cancel or reschedule your appointment, please contact [facility]  Dept: [phone]  and follow the prompts.  Office hours are 8:00 a.m. to 4:30 p.m. Monday - Friday. Please note that voicemails left after 4:00 p.m. may not be returned until the following business day.  We are closed weekends and major holidays. You have access to a nurse at all times for urgent questions. Please call the main number to the clinic Dept: [phone] and follow the prompts.   For any non-urgent questions, you may also contact your provider using MyChart. We now offer e-Visits for anyone 59 and older to request care online for non-urgent symptoms. For details visit [email or web address].   Also download the MyChart app! Go to the app store, search "MyChart", open the app, select [facility], and log in with your MyChart username and password.  Due to Covid, a mask is required upon entering the hospital/clinic. If you do not have a mask, one will be given to you upon arrival. For doctor visits, patients may have 1 support person aged 3 or older with them. For treatment visits, patients cannot have anyone with them due to current Covid guidelines and our immunocompromised population.

## 2021-05-07 ENCOUNTER — Telehealth: Payer: Self-pay | Admitting: Hematology and Oncology

## 2021-05-07 NOTE — Telephone Encounter (Signed)
Scheduled per los. Called and spoke with patient. Confirmed appt 

## 2021-05-08 ENCOUNTER — Ambulatory Visit: Payer: 59

## 2021-05-12 ENCOUNTER — Encounter: Payer: Self-pay | Admitting: Hematology and Oncology

## 2021-05-15 ENCOUNTER — Encounter: Payer: Self-pay | Admitting: *Deleted

## 2021-05-29 ENCOUNTER — Ambulatory Visit (HOSPITAL_COMMUNITY)
Admission: RE | Admit: 2021-05-29 | Discharge: 2021-05-29 | Disposition: A | Discharge status: Home / Self Care | Payer: 59 | Source: Ambulatory Visit | Attending: Hematology and Oncology | Admitting: Hematology and Oncology

## 2021-05-29 ENCOUNTER — Other Ambulatory Visit: Payer: Self-pay

## 2021-05-29 DIAGNOSIS — C8335 Diffuse large B-cell lymphoma, lymph nodes of inguinal region and lower limb: Secondary | ICD-10-CM | POA: Diagnosis not present

## 2021-05-29 LAB — GLUCOSE, CAPILLARY: Glucose-Capillary: 117 mg/dL — ABNORMAL HIGH (ref 70–99)

## 2021-05-29 MED ORDER — FLUDEOXYGLUCOSE F - 18 (FDG) INJECTION
13.4000 | Freq: Once | INTRAVENOUS | Status: AC
  Administered 2021-05-29: 13.2 via INTRAVENOUS

## 2021-05-31 ENCOUNTER — Telehealth: Payer: Self-pay | Admitting: *Deleted

## 2021-05-31 NOTE — Telephone Encounter (Signed)
TCT patient regarding results. Spoke with patient and informed that his PET scan results are back and that the findings are consistent with complete remission. Pt very very pleased with results!  He is aware of his appt on 06/05/21 to discuss surveillance follow up.

## 2021-05-31 NOTE — Telephone Encounter (Signed)
-----   Message from Orson Slick, MD sent at 05/31/2021  9:37 AM EDT ----- Please let Steve Smith know that his recent PET CT scan shows an excellent response to therapy. The lymph nodes are either Deauville 1 or 2. No evidence of residual disease. These findings are consistent with a complete remission. We will see him back on 06/05/2021 to discuss the next steps for surveillance and follow up.   ----- Message ----- From: Interface, Rad Results In Sent: 05/31/2021   9:01 AM EDT To: Orson Slick, MD

## 2021-06-05 ENCOUNTER — Inpatient Hospital Stay (HOSPITAL_BASED_OUTPATIENT_CLINIC_OR_DEPARTMENT_OTHER): Payer: 59

## 2021-06-05 ENCOUNTER — Inpatient Hospital Stay: Payer: 59 | Attending: Hematology and Oncology | Admitting: Hematology and Oncology

## 2021-06-05 ENCOUNTER — Encounter: Payer: Self-pay | Admitting: Hematology and Oncology

## 2021-06-05 ENCOUNTER — Other Ambulatory Visit: Payer: Self-pay

## 2021-06-05 VITALS — BP 149/96 | HR 90 | Temp 98.8°F | Resp 20 | Wt 272.9 lb

## 2021-06-05 DIAGNOSIS — Z85828 Personal history of other malignant neoplasm of skin: Secondary | ICD-10-CM | POA: Insufficient documentation

## 2021-06-05 DIAGNOSIS — C8338 Diffuse large B-cell lymphoma, lymph nodes of multiple sites: Secondary | ICD-10-CM | POA: Diagnosis not present

## 2021-06-05 DIAGNOSIS — Z7952 Long term (current) use of systemic steroids: Secondary | ICD-10-CM | POA: Diagnosis not present

## 2021-06-05 DIAGNOSIS — Z95828 Presence of other vascular implants and grafts: Secondary | ICD-10-CM

## 2021-06-05 DIAGNOSIS — D6481 Anemia due to antineoplastic chemotherapy: Secondary | ICD-10-CM | POA: Diagnosis not present

## 2021-06-05 DIAGNOSIS — T451X5A Adverse effect of antineoplastic and immunosuppressive drugs, initial encounter: Secondary | ICD-10-CM | POA: Insufficient documentation

## 2021-06-05 DIAGNOSIS — C8335 Diffuse large B-cell lymphoma, lymph nodes of inguinal region and lower limb: Secondary | ICD-10-CM

## 2021-06-05 DIAGNOSIS — I10 Essential (primary) hypertension: Secondary | ICD-10-CM | POA: Insufficient documentation

## 2021-06-05 DIAGNOSIS — D701 Agranulocytosis secondary to cancer chemotherapy: Secondary | ICD-10-CM | POA: Insufficient documentation

## 2021-06-05 LAB — CBC WITH DIFFERENTIAL (CANCER CENTER ONLY)
Abs Immature Granulocytes: 0.02 10*3/uL (ref 0.00–0.07)
Basophils Absolute: 0 10*3/uL (ref 0.0–0.1)
Basophils Relative: 1 %
Eosinophils Absolute: 0.2 10*3/uL (ref 0.0–0.5)
Eosinophils Relative: 7 %
HCT: 30.8 % — ABNORMAL LOW (ref 39.0–52.0)
Hemoglobin: 10.6 g/dL — ABNORMAL LOW (ref 13.0–17.0)
Immature Granulocytes: 1 %
Lymphocytes Relative: 16 %
Lymphs Abs: 0.5 10*3/uL — ABNORMAL LOW (ref 0.7–4.0)
MCH: 33.7 pg (ref 26.0–34.0)
MCHC: 34.4 g/dL (ref 30.0–36.0)
MCV: 97.8 fL (ref 80.0–100.0)
Monocytes Absolute: 0.5 10*3/uL (ref 0.1–1.0)
Monocytes Relative: 14 %
Neutro Abs: 2.1 10*3/uL (ref 1.7–7.7)
Neutrophils Relative %: 61 %
Platelet Count: 162 10*3/uL (ref 150–400)
RBC: 3.15 MIL/uL — ABNORMAL LOW (ref 4.22–5.81)
RDW: 15.1 % (ref 11.5–15.5)
WBC Count: 3.4 10*3/uL — ABNORMAL LOW (ref 4.0–10.5)
nRBC: 0 % (ref 0.0–0.2)

## 2021-06-05 LAB — URIC ACID: Uric Acid, Serum: 4.7 mg/dL (ref 3.7–8.6)

## 2021-06-05 LAB — CMP (CANCER CENTER ONLY)
ALT: 14 U/L (ref 0–44)
AST: 20 U/L (ref 15–41)
Albumin: 3.6 g/dL (ref 3.5–5.0)
Alkaline Phosphatase: 67 U/L (ref 38–126)
Anion gap: 10 (ref 5–15)
BUN: 12 mg/dL (ref 6–20)
CO2: 23 mmol/L (ref 22–32)
Calcium: 9.1 mg/dL (ref 8.9–10.3)
Chloride: 107 mmol/L (ref 98–111)
Creatinine: 1.09 mg/dL (ref 0.61–1.24)
GFR, Estimated: >60 mL/min (ref >60)
Glucose, Bld: 95 mg/dL (ref 70–99)
Potassium: 4.2 mmol/L (ref 3.5–5.1)
Sodium: 140 mmol/L (ref 135–145)
Total Bilirubin: 0.4 mg/dL (ref 0.3–1.2)
Total Protein: 6 g/dL — ABNORMAL LOW (ref 6.5–8.1)

## 2021-06-05 LAB — LACTATE DEHYDROGENASE: LDH: 225 U/L — ABNORMAL HIGH (ref 98–192)

## 2021-06-05 NOTE — Progress Notes (Signed)
[place] [phone]   [phone]  PROGRESS NOTE  Patient Care Team: [employer], MD as PCP - General (Internal Medicine)  Hematological/Oncological History # Diffuse Large B Cell Lymphoma, Stage IV 12/17/2020: CT scan at [facility], results show left inguinal adenopathy and multiple solid splenic lesions probably of lymphoid origin  12/26/2020: excisional biopsy of left inguinal lymph node shows DLBCL.  01/04/2021: establish care with Dr. Lorenso Courier  01/09/2021: PET CT scan shows multiple hypermetabolic splenic lesions compatible with lymphoma.  There are also 2 hypermetabolic liver lesions also likely related to lymphoma.  No evidence of hypermetabolic activity in the neck, chest, abdomen or pelvis elsewhere. 01/21/2021: Cycle 1 Day 1 of R-CHOP chemotherapy 02/11/2021: Cycle 2 Day 1 of R-CHOP chemotherapy 03/04/2021: Cycle 3 Day 1 of R-CHOP chemotherapy 03/20/2021: PET CT scan shows excellent response, with resolution of splenic/liver lesions and only Deauville 1 and 2 lesions remaining elsewhere.  03/25/2021: Cycle 4 Day 1 of R-CHOP chemotherapy 04/17/2021: Cycle 5 Day 1 of R-CHOP chemotherapy 05/06/2021: Cycle 6 Day 1 of R-CHOP chemotherapy 05/29/2021: post treatment PET CT scan showed Deauville Score 1 of prior lymph nodes. Consistent with complete metabolic response. Enter surviellance.   Interval History:  Steve Smith 60 y.o. male with medical history significant for Stage IV DLBCL who presents for a follow up visit. The patient's last visit was on 05/06/2021 for Cycle 6. In the interim since the last visit Steve Smith has undergone his post treatment PET CT scan with findings consistent with complete response.   On exam today Steve Smith is accompanied by his wife.  He reports he has been well overall in the interim since our last visit.  He notes he continues to have low stamina but that his energy is slowly improving.  He tolerated the last cycle of  chemotherapy well and is grateful to be done with treatment.  He is also elated the results of his most recent PET CT scan. He notes otherwise his appetite has been good and he denies any fevers, chills, sweats, nausea, vomiting or diarrhea.  Otherwise he has had no focal symptoms, increased lymphadenopathy, or other concerning findings.  A full 10 point ROS is listed below.  MEDICAL HISTORY:  Past Medical History:  Diagnosis Date   Basal cell carcinoma    Hypertension    Kidney stone     SURGICAL HISTORY: Past Surgical History:  Procedure Laterality Date   INGUINAL LYMPH NODE BIOPSY Left 12/26/2020   Procedure: LEFT INGUINAL EXCISIONAL  LYMPH NODE BIOPSY;  Surgeon: Virl Cagey, MD;  Location: [facility];  Service: General;  Laterality: Left;   IR IMAGING GUIDED PORT INSERTION  01/17/2021   ROTATOR CUFF REPAIR     Left    SOCIAL HISTORY: Social History   Socioeconomic History   Marital status: Married    Spouse name: Not on file   Number of children: Not on file   Years of education: Not on file   Highest education level: Not on file  Occupational History   Not on file  Tobacco Use   Smoking status: Never   Smokeless tobacco: Never  Substance and Sexual Activity   Alcohol use: No   Drug use: No   Sexual activity: Not on file  Other Topics Concern   Not on file  Social History Narrative   Not on file   Social Determinants of Health   Financial Resource Strain: Not on file  Food Insecurity: Not on file  Transportation Needs: Not on file  Physical Activity: Not on file  Stress: Not on file  Social Connections: Not on file  Intimate Partner Violence: Not on file    FAMILY HISTORY: Family History  Problem Relation Age of Onset   Basal cell carcinoma Mother    Liver cancer Father     ALLERGIES:  has No Known Allergies.  MEDICATIONS:  Current Outpatient Medications  Medication Sig Dispense Refill   allopurinol (ZYLOPRIM) 300 MG tablet Take 1 tablet (300 mg  total) by mouth daily. 90 tablet 1   doxycycline (VIBRA-TABS) 100 MG tablet Take 1 tablet (100 mg total) by mouth 2 (two) times daily. 28 tablet 0   lidocaine-prilocaine (EMLA) cream Apply 1 application topically as needed. 30 g 0   olmesartan (BENICAR) 40 MG tablet Take 40 mg by mouth daily.     ondansetron (ZOFRAN) 4 MG tablet Take 1 tablet (4 mg total) by mouth every 8 (eight) hours as needed. 30 tablet 0   pantoprazole (PROTONIX) 40 MG tablet Take 1 tablet (40 mg total) by mouth daily. 90 each 1   predniSONE (DELTASONE) 20 MG tablet Take 3 tablets (60 mg total) by mouth as directed. Take 3 tablets in the morning on Day 1 of chemotherapy. Take 3 tablets in the morning for the next 4 days. 15 tablet 5   prochlorperazine (COMPAZINE) 10 MG tablet Take 1 tablet (10 mg total) by mouth every 6 (six) hours as needed for nausea or vomiting. 30 tablet 0   No current facility-administered medications for this visit.    REVIEW OF SYSTEMS:   Constitutional: ( - ) fevers, ( - )  chills , ( - ) night sweats Eyes: ( - ) blurriness of vision, ( - ) double vision, ( - ) watery eyes Ears, nose, mouth, throat, and face: ( - ) mucositis, ( - ) sore throat Respiratory: ( - ) cough, ( - ) dyspnea, ( - ) wheezes Cardiovascular: ( - ) palpitation, ( - ) chest discomfort, ( - ) lower extremity swelling Gastrointestinal:  ( - ) nausea, ( - ) heartburn, ( - ) change in bowel habits Skin: ( - ) abnormal skin rashes Lymphatics: ( - ) new lymphadenopathy, ( - ) easy bruising Neurological: ( - ) numbness, ( - ) tingling, ( - ) new weaknesses Behavioral/Psych: ( - ) mood change, ( - ) new changes  All other systems were reviewed with the patient and are negative.  PHYSICAL EXAMINATION: ECOG PERFORMANCE STATUS: 1 - Symptomatic but completely ambulatory  Vitals:   06/05/21 1509  BP: (!) 149/96  Pulse: 90  Resp: 20  Temp: 98.8 F (37.1 C)  SpO2: 98%     Filed Weights   06/05/21 1509  Weight: 272 lb 14.4 oz  (123.8 kg)    GENERAL: well appearing middle aged Caucasian male. alert, no distress and comfortable SKIN: skin color, texture, turgor are normal, no rashes or significant lesions EYES: conjunctiva are pink and non-injected, sclera clear LUNGS: clear to auscultation and percussion with normal breathing effort HEART: regular rate & rhythm and no murmurs and no lower extremity edema Musculoskeletal: no cyanosis of digits and no clubbing  PSYCH: alert & oriented x 3, fluent speech NEURO: no focal motor/sensory deficits  LABORATORY DATA:  I have reviewed the data as listed CBC Latest Ref Rng & Units 06/05/2021 05/06/2021 04/17/2021  WBC 4.0 - 10.5 K/uL 3.4(L) 3.6(L) 3.9(L)  Hemoglobin 13.0 - 17.0 g/dL 10.6(L) 10.2(L) 10.6(L)  Hematocrit 39.0 - 52.0 % 30.8(L) 30.6(L) 31.9(L)  Platelets 150 - 400 K/uL 162 253 261    CMP Latest Ref Rng & Units 06/05/2021 05/06/2021 04/17/2021  Glucose 70 - 99 mg/dL 95 197(H) 146(H)  BUN 6 - 20 mg/dL '12 18 12  '$ Creatinine 0.61 - 1.24 mg/dL 1.09 1.14 1.19  Sodium 135 - 145 mmol/L 140 139 141  Potassium 3.5 - 5.1 mmol/L 4.2 4.3 4.0  Chloride 98 - 111 mmol/L 107 106 105  CO2 22 - 32 mmol/L '23 24 29  '$ Calcium 8.9 - 10.3 mg/dL 9.1 9.6 9.4  Total Protein 6.5 - 8.1 g/dL 6.0(L) 6.3(L) 6.1(L)  Total Bilirubin 0.3 - 1.2 mg/dL 0.4 0.3 0.5  Alkaline Phos 38 - 126 U/L 67 66 63  AST 15 - 41 U/L 20 14(L) 21  ALT 0 - 44 U/L '14 14 15    '$ RADIOGRAPHIC STUDIES: NM PET Image Restag (PS) Skull Base To Thigh  Result Date: 05/31/2021 CLINICAL DATA:  Initial treatment strategy for diffuse B-cell lymphoma. EXAM: NUCLEAR MEDICINE PET SKULL BASE TO THIGH TECHNIQUE: 13.2 mCi F-18 FDG was injected intravenously. Full-ring PET imaging was performed from the skull base to thigh after the radiotracer. CT data was obtained and used for attenuation correction and anatomic localization. Fasting blood glucose: 117 mg/dl COMPARISON:  March 20, 2021 PET-CT FINDINGS: Mediastinal blood pool activity: SUV  max 2.6 Liver activity: SUV max 4.8 NECK: No hypermetabolic lymph nodes in the neck. Incidental CT findings: Calcified mediastinal and left hilar lymph nodes, likely sequela prior granulomatous infection. Stable right lower lobe pulmonary nodule located on series 4, image 98 measuring 3 mm. CHEST: No hypermetabolic mediastinal or hilar nodes. No suspicious pulmonary nodules on the CT scan. Incidental CT findings: Calcified mediastinal and left hilar lymph nodes, likely sequela prior granulomatous infection. Stable right lower lobe pulmonary nodule located on series 4, image 98 measuring 3 mm. Right chest wall port with tip in the upper right atrium. ABDOMEN/PELVIS: No abnormal hypermetabolic activity within the liver, pancreas, adrenal glands, or spleen. No hypermetabolic lymph nodes in the abdomen or pelvis. Normal-sized spleen. Left inguinal ill-defined soft tissue at site of previous lymphadenectomy is decreased in size compared to prior exam, likely evolving postsurgical change. Unchanged mildly increased bilateral testicular uptake, SUV max 6.0. Right inguinal lymph node unchanged in size compared to prior exam with an SUV max of 2.1. Incidental CT findings: Punctate nonobstructing stone the left kidney. No hydronephrosis. Calcifications of the spleen, likely due to prior granulomatous infection. Bladder is decompressed. Atherosclerotic calcifications of the abdominal aorta. SKELETON: No focal hypermetabolic activity to suggest skeletal metastasis. Incidental CT findings: none IMPRESSION: No evidence of hypermetabolic disease chest, abdomen or pelvis. (Deauville category 1 or 2 due to background organ uptake) Right inguinal lymph nodes with mild FDG uptake below mediastinal blood pool are unchanged in size compared to prior exam node likely reactive. Unchanged mildly increased bilateral testicular FDG uptake. Finding could be further evaluated with testicular ultrasound. Aortic Atherosclerosis (ICD10-I70.0).  Electronically Signed   By: Yetta Glassman M.D.   On: 05/31/2021 08:59    ASSESSMENT & PLAN Steve Smith 60 y.o. male with medical history significant for Stage IV DLBCL who presents for a follow up visit.  After review the labs, the records, schedule the patient the findings most consistent with a diffuse large B-cell lymphoma with staging in process.  We completed the staging with a PET CT scan which showed involvement of the liver and spleen.  At  this time his disease does appear as late stage and we will plan for at least 6 cycles of R-CHOP followed by repeat scans.   Previously we discussed the risks and benefits of R-CHOP chemotherapy.  We noted the nature of diffuse large B-cell lymphoma and the possibility of achieving a complete remission with this chemotherapy regimen.  I also noted the side effects which include but are not limited to nausea, vomiting, hair loss, diarrhea, sedation, insomnia, hyperglycemia, high blood pressure, and cardiotoxicity.  The patient voices understanding of the risk and benefits and was agreeable to proceeding with treatment at this time.   The R-CHOP regimen consists of rituximab 375 mg per metered squared IV on day 1, cyclophosphamide 750 mg per metered squared on day 1, doxorubicin 50 mg/m on day 1, vincristine 1.4 mg per metered squared with a max dose of 2 mg on day 1, and prednisone 100 mg orally days 1 through 5.  A cycle lasts for 21 days and number of cycles planned is 6.   IPI Score: 3 points. (53% progression free survival).   #Diffuse Large B Cell Lymphoma, Stage IV --Findings at this time are most consistent with a Stage IV diffuse large B-cell lymphoma  He is Stage IV, required 6 cycles of therapy.  --Baseline labs to include CBC, CMP, uric acid and LDH. --patient completed 6 cycles of R-CHOP with complete metabolic response noted on post treatment PET CT scan from 05/29/2021.  Plan: --per NCCN guidelines, plan for clinic visits q3-6 months  for the first 2 years, with q 6 month CT scans.  --Have the patient return to clinic in 12 weeks time. Plan for repeat CT scan in 6 months time.   #Chemotherapy Induced Anemia #Chemotherapy Induced Neutropenia -- Hemoglobin today at 10.6.  Trending back up.  --White blood cell at 3.4, ANC 2.1.  --Continue to monitor.  # Rash, resolved  #Fever, resolved  -- lesions on resolving --No other focal symptoms.  Patient is not neutropenic and therefore there is no indication for admission and IV antibiotics. --Strict return precautions for worsening fever or new focal symptoms.   #Supportive Care --chemotherapy education completed --port in place. Can schedule for removal today --zofran '8mg'$  q8H PRN and compazine '10mg'$  PO q6H for nausea --port placement to be scheduled. -- allopurinol '300mg'$  PO daily for TLS prophylaxis. Can d/c this after todays treatment.  -- EMLA cream for port -- no pain medication required at this time.   No orders of the defined types were placed in this encounter.  All questions were answered. The patient knows to call the clinic with any problems, questions or concerns.  A total of more than 30 minutes were spent on this encounter and over half of that time was spent on counseling and coordination of care as outlined above.   Ledell Peoples, MD Department of Hematology/Oncology [place] at [facility] Phone: [phone] Pager: [phone] Email: [email or web address]  06/05/2021 3:35 PM

## 2021-06-07 ENCOUNTER — Other Ambulatory Visit: Payer: Self-pay | Admitting: Hematology and Oncology

## 2021-06-21 ENCOUNTER — Other Ambulatory Visit: Payer: Self-pay | Admitting: Radiology

## 2021-06-24 ENCOUNTER — Other Ambulatory Visit: Payer: Self-pay

## 2021-06-24 ENCOUNTER — Encounter (HOSPITAL_COMMUNITY): Payer: Self-pay

## 2021-06-24 ENCOUNTER — Ambulatory Visit (HOSPITAL_COMMUNITY)
Admission: RE | Admit: 2021-06-24 | Discharge: 2021-06-24 | Disposition: A | Discharge status: Home / Self Care | Payer: 59 | Source: Ambulatory Visit | Attending: Hematology and Oncology | Admitting: Hematology and Oncology

## 2021-06-24 DIAGNOSIS — I10 Essential (primary) hypertension: Secondary | ICD-10-CM | POA: Diagnosis not present

## 2021-06-24 DIAGNOSIS — Z79899 Other long term (current) drug therapy: Secondary | ICD-10-CM | POA: Diagnosis not present

## 2021-06-24 DIAGNOSIS — C8510 Unspecified B-cell lymphoma, unspecified site: Secondary | ICD-10-CM | POA: Insufficient documentation

## 2021-06-24 DIAGNOSIS — C8335 Diffuse large B-cell lymphoma, lymph nodes of inguinal region and lower limb: Secondary | ICD-10-CM

## 2021-06-24 DIAGNOSIS — Z452 Encounter for adjustment and management of vascular access device: Secondary | ICD-10-CM | POA: Diagnosis present

## 2021-06-24 HISTORY — PX: IR REMOVAL TUN ACCESS W/ PORT W/O FL MOD SED: IMG2290

## 2021-06-24 MED ORDER — LIDOCAINE HCL 1 % IJ SOLN
INTRAMUSCULAR | Status: AC
  Filled 2021-06-24: qty 20

## 2021-06-24 MED ORDER — LIDOCAINE-EPINEPHRINE 2 %-1:100000 IJ SOLN
INTRAMUSCULAR | Status: DC | PRN
  Administered 2021-06-24: 10 mL via INTRADERMAL

## 2021-06-24 MED ORDER — SODIUM CHLORIDE 0.9 % IV SOLN: INTRAVENOUS | Status: DC

## 2021-06-24 MED ORDER — MIDAZOLAM HCL 2 MG/2ML IJ SOLN
INTRAMUSCULAR | Status: AC
  Filled 2021-06-24: qty 2

## 2021-06-24 MED ORDER — MIDAZOLAM HCL 2 MG/2ML IJ SOLN
INTRAMUSCULAR | Status: DC | PRN
  Administered 2021-06-24: 1 mg via INTRAVENOUS

## 2021-06-24 MED ORDER — FENTANYL CITRATE (PF) 100 MCG/2ML IJ SOLN
INTRAMUSCULAR | Status: DC | PRN
  Administered 2021-06-24: 50 ug via INTRAVENOUS

## 2021-06-24 MED ORDER — LIDOCAINE-EPINEPHRINE (PF) 2 %-1:200000 IJ SOLN
INTRAMUSCULAR | Status: AC
  Filled 2021-06-24: qty 20

## 2021-06-24 MED ORDER — FENTANYL CITRATE (PF) 100 MCG/2ML IJ SOLN
INTRAMUSCULAR | Status: AC
  Filled 2021-06-24: qty 2

## 2021-06-24 NOTE — Discharge Instructions (Signed)
Please call Interventional Radiology clinic [phone] with any questions or concerns.  You may remove your dressing and shower tomorrow.   Implanted Port Removal, Care After This sheet gives you information about how to care for yourself after your procedure. Your health care provider may also give you more specific instructions. If you have problems or questions, contact your health care provider. What can I expect after the procedure? After the procedure, it is common to have: Soreness or pain near your incision. Some swelling or bruising near your incision. Follow these instructions at home: Medicines Take over-the-counter and prescription medicines only as told by your health care provider. If you were prescribed an antibiotic medicine, take it as told by your health care provider. Do not stop taking the antibiotic even if you start to feel better. Bathing Do not take baths, swim, or use a hot tub until your health care provider approves. Ask your health care provider if you can take showers. You may only be allowed to take sponge baths. Incision care Follow instructions from your health care provider about how to take care of your incision. Make sure you: Wash your hands with soap and water before you change your bandage (dressing). If soap and water are not available, use hand sanitizer. Change your dressing as told by your health care provider. Keep your dressing dry. Leave stitches (sutures), skin glue, or adhesive strips in place. These skin closures may need to stay in place for 2 weeks or longer. If adhesive strip edges start to loosen and curl up, you may trim the loose edges. Do not remove adhesive strips completely unless your health care provider tells you to do that. Check your incision area every day for signs of infection. Check for: More redness, swelling, or pain. More fluid or blood. Warmth. Pus or a bad smell.    Driving Do not drive for 24 hours if you were  given a medicine to help you relax (sedative) during your procedure. If you did not receive a sedative, ask your health care provider when it is safe to drive.    Activity Return to your normal activities as told by your health care provider. Ask your health care provider what activities are safe for you. Do not lift anything that is heavier than 10 lb (4.5 kg), or the limit that you are told, until your health care provider says that it is safe. Do not do activities that involve lifting your arms over your head. General instructions Do not use any products that contain nicotine or tobacco, such as cigarettes and e-cigarettes. These can delay healing. If you need help quitting, ask your health care provider. Keep all follow-up visits as told by your health care provider. This is important. Contact a health care provider if: You have more redness, swelling, or pain around your incision. You have more fluid or blood coming from your incision. Your incision feels warm to the touch. You have pus or a bad smell coming from your incision. You have pain that is not relieved by your pain medicine. Get help right away if you have: A fever or chills. Chest pain. Difficulty breathing. Summary After the procedure, it is common to have pain, soreness, swelling, or bruising near your incision. If you were prescribed an antibiotic medicine, take it as told by your health care provider. Do not stop taking the antibiotic even if you start to feel better. Do not drive for 24 hours if you were given a sedative during   your procedure. Return to your normal activities as told by your health care provider. Ask your health care provider what activities are safe for you. This information is not intended to replace advice given to you by your health care provider. Make sure you discuss any questions you have with your health care provider. Document Revised: 10/29/2017 Document Reviewed: 10/29/2017 Elsevier Patient  Education  2021 [employer] Inc[employer]   Moderate Conscious Sedation, Adult, Care After This sheet gives you information about how to care for yourself after your procedure. Your health care provider may also give you more specific instructions. If you have problems or questions, contact your health care provider. What can I expect after the procedure? After the procedure, it is common to have: Sleepiness for several hours. Impaired judgment for several hours. Difficulty with balance. Vomiting if you eat too soon. Follow these instructions at home: For the time period you were told by your health care provider: Rest. Do not participate in activities where you could fall or become injured. Do not drive or use machinery. Do not drink alcohol. Do not take sleeping pills or medicines that cause drowsiness. Do not make important decisions or sign legal documents. Do not take care of children on your own.        Eating and drinking Follow the diet recommended by your health care provider. Drink enough fluid to keep your urine pale yellow. If you vomit: Drink water, juice, or soup when you can drink without vomiting. Make sure you have little or no nausea before eating solid foods.    General instructions Take over-the-counter and prescription medicines only as told by your health care provider. Have a responsible adult stay with you for the time you are told. It is important to have someone help care for you until you are awake and alert. Do not smoke. Keep all follow-up visits as told by your health care provider. This is important. Contact a health care provider if: You are still sleepy or having trouble with balance after 24 hours. You feel light-headed. You keep feeling nauseous or you keep vomiting. You develop a rash. You have a fever. You have redness or swelling around the IV site. Get help right away if: You have trouble breathing. You have new-onset confusion at  home. Summary After the procedure, it is common to feel sleepy, have impaired judgment, or feel nauseous if you eat too soon. Rest after you get home. Know the things you should not do after the procedure. Follow the diet recommended by your health care provider and drink enough fluid to keep your urine pale yellow. Get help right away if you have trouble breathing or new-onset confusion at home. This information is not intended to replace advice given to you by your health care provider. Make sure you discuss any questions you have with your health care provider. Document Revised: 01/13/2020 Document Reviewed: 08/11/2019 Elsevier Patient Education  2021 [employer] Inc.   

## 2021-06-24 NOTE — H&P (Signed)
Chief Complaint: Patient was seen in consultation today for Port-A-Cath removal  at the request of Draper T IV  Referring Physician(s): Dorsey,John T IV  Supervising Physician: Mir, Sharen Heck  Patient Status: [facility] - Out-pt  History of Present Illness: Steve Smith is a 60 y.o. male PMH of basal cell carcinoma, hypertension, nephrolithiasis and significant medical history of large B-cell lymphoma stage IV.  Patient started treatment 12/17/2020 and completed treatment 05/29/2021.  Posttreatment PET scans showed findings consistent with complete response.  Dr. Lorenso Courier has requested patient's Port-A-Cath be removed since chemotherapy has been completed.  Past Medical History:  Diagnosis Date   Basal cell carcinoma    Hypertension    Kidney stone     Past Surgical History:  Procedure Laterality Date   INGUINAL LYMPH NODE BIOPSY Left 12/26/2020   Procedure: LEFT INGUINAL EXCISIONAL  LYMPH NODE BIOPSY;  Surgeon: Virl Cagey, MD;  Location: [facility];  Service: General;  Laterality: Left;   IR IMAGING GUIDED PORT INSERTION  01/17/2021   ROTATOR CUFF REPAIR     Left    Allergies: Patient has no known allergies.  Medications: Prior to Admission medications   Medication Sig Start Date End Date Taking? Authorizing Provider  allopurinol (ZYLOPRIM) 300 MG tablet Take 1 tablet (300 mg total) by mouth daily. 01/08/21  Yes Orson Slick, MD  doxycycline (VIBRA-TABS) 100 MG tablet Take 1 tablet (100 mg total) by mouth 2 (two) times daily. 04/25/21  Yes Orson Slick, MD  lidocaine-prilocaine (EMLA) cream Apply 1 application topically as needed. 01/08/21  Yes Orson Slick, MD  olmesartan (BENICAR) 40 MG tablet Take 40 mg by mouth daily.   Yes [provider]  ondansetron (ZOFRAN) 4 MG tablet Take 1 tablet (4 mg total) by mouth every 8 (eight) hours as needed. 12/26/20 12/26/21 Yes Virl Cagey, MD  pantoprazole (PROTONIX) 40 MG tablet Take 1 tablet (40 mg total)  by mouth daily. 01/21/21  Yes Orson Slick, MD  predniSONE (DELTASONE) 20 MG tablet Take 3 tablets (60 mg total) by mouth as directed. Take 3 tablets in the morning on Day 1 of chemotherapy. Take 3 tablets in the morning for the next 4 days. 01/21/21  Yes Orson Slick, MD  prochlorperazine (COMPAZINE) 10 MG tablet Take 1 tablet (10 mg total) by mouth every 6 (six) hours as needed for nausea or vomiting. 01/08/21  Yes Orson Slick, MD     Family History  Problem Relation Age of Onset   Basal cell carcinoma Mother    Liver cancer Father     Social History   Socioeconomic History   Marital status: Married    Spouse name: Not on file   Number of children: Not on file   Years of education: Not on file   Highest education level: Not on file  Occupational History   Not on file  Tobacco Use   Smoking status: Never   Smokeless tobacco: Never  Substance and Sexual Activity   Alcohol use: No   Drug use: No   Sexual activity: Not on file  Other Topics Concern   Not on file  Social History Narrative   Not on file   Social Determinants of Health   Financial Resource Strain: Not on file  Food Insecurity: Not on file  Transportation Needs: Not on file  Physical Activity: Not on file  Stress: Not on file  Social Connections: Not on file  Review of Systems: A 12 point ROS discussed and pertinent positives are indicated in the HPI above.  All other systems are negative.  Review of Systems  Constitutional:  Negative for appetite change, chills and fever.  Respiratory:  Negative for cough and shortness of breath.   Cardiovascular:  Negative for chest pain and leg swelling.  Gastrointestinal:  Negative for abdominal pain, blood in stool, diarrhea, nausea and vomiting.   Vital Signs: Ht 6\' 2"  (1.88 m)   Wt 270 lb (122.5 kg)   BMI 34.67 kg/m   Physical Exam Constitutional:      Appearance: Normal appearance. He is not ill-appearing.  HENT:     Head: Normocephalic  and atraumatic.     Mouth/Throat:     Mouth: Mucous membranes are dry.     Pharynx: Oropharynx is clear.  Cardiovascular:     Rate and Rhythm: Normal rate and regular rhythm.     Pulses: Normal pulses.     Heart sounds: Normal heart sounds. No murmur heard.   No gallop.  Pulmonary:     Effort: Pulmonary effort is normal. No respiratory distress.     Breath sounds: Normal breath sounds. No stridor. No wheezing, rhonchi or rales.  Abdominal:     General: There is no distension.     Palpations: Abdomen is soft.     Tenderness: There is no abdominal tenderness. There is no guarding.  Musculoskeletal:     Right lower leg: No edema.     Left lower leg: No edema.  Skin:    General: Skin is warm and dry.  Neurological:     Mental Status: He is alert and oriented to person, place, and time. Mental status is at baseline.  Psychiatric:        Mood and Affect: Mood normal.        Behavior: Behavior normal.        Thought Content: Thought content normal.        Judgment: Judgment normal.    Imaging: NM PET Image Restag (PS) Skull Base To Thigh  Result Date: 05/31/2021 CLINICAL DATA:  Initial treatment strategy for diffuse B-cell lymphoma. EXAM: NUCLEAR MEDICINE PET SKULL BASE TO THIGH TECHNIQUE: 13.2 mCi F-18 FDG was injected intravenously. Full-ring PET imaging was performed from the skull base to thigh after the radiotracer. CT data was obtained and used for attenuation correction and anatomic localization. Fasting blood glucose: 117 mg/dl COMPARISON:  March 20, 2021 PET-CT FINDINGS: Mediastinal blood pool activity: SUV max 2.6 Liver activity: SUV max 4.8 NECK: No hypermetabolic lymph nodes in the neck. Incidental CT findings: Calcified mediastinal and left hilar lymph nodes, likely sequela prior granulomatous infection. Stable right lower lobe pulmonary nodule located on series 4, image 98 measuring 3 mm. CHEST: No hypermetabolic mediastinal or hilar nodes. No suspicious pulmonary nodules on the  CT scan. Incidental CT findings: Calcified mediastinal and left hilar lymph nodes, likely sequela prior granulomatous infection. Stable right lower lobe pulmonary nodule located on series 4, image 98 measuring 3 mm. Right chest wall port with tip in the upper right atrium. ABDOMEN/PELVIS: No abnormal hypermetabolic activity within the liver, pancreas, adrenal glands, or spleen. No hypermetabolic lymph nodes in the abdomen or pelvis. Normal-sized spleen. Left inguinal ill-defined soft tissue at site of previous lymphadenectomy is decreased in size compared to prior exam, likely evolving postsurgical change. Unchanged mildly increased bilateral testicular uptake, SUV max 6.0. Right inguinal lymph node unchanged in size compared to prior exam with an SUV  max of 2.1. Incidental CT findings: Punctate nonobstructing stone the left kidney. No hydronephrosis. Calcifications of the spleen, likely due to prior granulomatous infection. Bladder is decompressed. Atherosclerotic calcifications of the abdominal aorta. SKELETON: No focal hypermetabolic activity to suggest skeletal metastasis. Incidental CT findings: none IMPRESSION: No evidence of hypermetabolic disease chest, abdomen or pelvis. (Deauville category 1 or 2 due to background organ uptake) Right inguinal lymph nodes with mild FDG uptake below mediastinal blood pool are unchanged in size compared to prior exam node likely reactive. Unchanged mildly increased bilateral testicular FDG uptake. Finding could be further evaluated with testicular ultrasound. Aortic Atherosclerosis (ICD10-I70.0). Electronically Signed   By: Yetta Glassman M.D.   On: 05/31/2021 08:59    Labs:  CBC: Recent Labs    03/25/21 0827 04/17/21 1026 05/06/21 0959 06/05/21 1451  WBC 3.9* 3.9* 3.6* 3.4*  HGB 10.8* 10.6* 10.2* 10.6*  HCT 32.3* 31.9* 30.6* 30.8*  PLT 300 261 253 162    COAGS: No results for input(s): INR, APTT in the last 8760 hours.  BMP: Recent Labs     03/25/21 0827 04/17/21 1026 05/06/21 0959 06/05/21 1451  NA 139 141 139 140  K 4.2 4.0 4.3 4.2  CL 105 105 106 107  CO2 25 29 24 23   GLUCOSE 144* 146* 197* 95  BUN 12 12 18 12   CALCIUM 9.3 9.4 9.6 9.1  CREATININE 1.09 1.19 1.14 1.09  GFRNONAA >60 >60 >60 >60    LIVER FUNCTION TESTS: Recent Labs    03/25/21 0827 04/17/21 1026 05/06/21 0959 06/05/21 1451  BILITOT 0.3 0.5 0.3 0.4  AST 18 21 14* 20  ALT 14 15 14 14   ALKPHOS 66 63 66 67  PROT 6.1* 6.1* 6.3* 6.0*  ALBUMIN 3.0* 3.6 3.5 3.6    TUMOR MARKERS: No results for input(s): AFPTM, CEA, CA199, CHROMGRNA in the last 8760 hours.  Assessment and Plan:  History of basal cell carcinoma, hypertension, nephrolithiasis and significant medical history of large B-cell lymphoma stage IV.  Patient started treatment 12/17/2020 and completed treatment 05/29/2021 posttreatment PET scans showed findings consistent with complete response.  Dr. Lorenso Courier has requested patient's Port-A-Cath be removed since chemotherapy has been completed.  Patient resting on bed.  He is alert and oriented, calm and pleasant.  He is in no distress. He states he is n.p.o. per order. No labs needed for today's procedure.   Risks and benefits of image guided port-a-catheter removal was discussed with the patient including, but not limited to bleeding, infection, pneumothorax, or fibrin sheath development and need for additional procedures.  All of the patient's questions were answered, patient is agreeable to proceed. Consent signed and in chart.   Thank you for this interesting consult.  I greatly enjoyed meeting KELLIE CHISOLM and look forward to participating in their care.  A copy of this report was sent to the requesting provider on this date.  Electronically Signed: Tyson Alias, NP 06/24/2021, 9:00 AM   I spent a total of 30-minutes in face to face in clinical consultation, greater than 50% of which was counseling/coordinating care for Port-A-Cath  removal.

## 2021-06-24 NOTE — Procedures (Signed)
Interventional Radiology Procedure Note  Procedure: Chest port removal   Indication: Completion of chemotherapy  Findings: Please refer to procedural dictation for full description.  Complications: None  EBL: < 10 mL  Jamilee Lafosse, MD [phone]   

## 2021-09-04 ENCOUNTER — Encounter: Payer: Self-pay | Admitting: Hematology and Oncology

## 2021-09-04 ENCOUNTER — Other Ambulatory Visit: Payer: Self-pay

## 2021-09-04 ENCOUNTER — Inpatient Hospital Stay (HOSPITAL_BASED_OUTPATIENT_CLINIC_OR_DEPARTMENT_OTHER): Payer: 59 | Admitting: Hematology and Oncology

## 2021-09-04 ENCOUNTER — Inpatient Hospital Stay: Payer: 59 | Attending: Hematology and Oncology

## 2021-09-04 VITALS — BP 151/90 | HR 89 | Temp 96.9°F | Resp 18 | Wt 285.3 lb

## 2021-09-04 DIAGNOSIS — C8335 Diffuse large B-cell lymphoma, lymph nodes of inguinal region and lower limb: Secondary | ICD-10-CM | POA: Diagnosis not present

## 2021-09-04 DIAGNOSIS — Z79899 Other long term (current) drug therapy: Secondary | ICD-10-CM | POA: Insufficient documentation

## 2021-09-04 DIAGNOSIS — C8338 Diffuse large B-cell lymphoma, lymph nodes of multiple sites: Secondary | ICD-10-CM | POA: Insufficient documentation

## 2021-09-04 DIAGNOSIS — Z95828 Presence of other vascular implants and grafts: Secondary | ICD-10-CM

## 2021-09-04 LAB — CBC WITH DIFFERENTIAL (CANCER CENTER ONLY)
Abs Immature Granulocytes: 0.01 10*3/uL (ref 0.00–0.07)
Basophils Absolute: 0 10*3/uL (ref 0.0–0.1)
Basophils Relative: 1 %
Eosinophils Absolute: 0.3 10*3/uL (ref 0.0–0.5)
Eosinophils Relative: 7 %
HCT: 39.8 % (ref 39.0–52.0)
Hemoglobin: 13.7 g/dL (ref 13.0–17.0)
Immature Granulocytes: 0 %
Lymphocytes Relative: 18 %
Lymphs Abs: 0.7 10*3/uL (ref 0.7–4.0)
MCH: 31.6 pg (ref 26.0–34.0)
MCHC: 34.4 g/dL (ref 30.0–36.0)
MCV: 91.9 fL (ref 80.0–100.0)
Monocytes Absolute: 0.5 10*3/uL (ref 0.1–1.0)
Monocytes Relative: 12 %
Neutro Abs: 2.4 10*3/uL (ref 1.7–7.7)
Neutrophils Relative %: 62 %
Platelet Count: 174 10*3/uL (ref 150–400)
RBC: 4.33 MIL/uL (ref 4.22–5.81)
RDW: 13 % (ref 11.5–15.5)
WBC Count: 3.9 10*3/uL — ABNORMAL LOW (ref 4.0–10.5)
nRBC: 0 % (ref 0.0–0.2)

## 2021-09-04 LAB — CMP (CANCER CENTER ONLY)
ALT: 15 U/L (ref 0–44)
AST: 15 U/L (ref 15–41)
Albumin: 3.7 g/dL (ref 3.5–5.0)
Alkaline Phosphatase: 90 U/L (ref 38–126)
Anion gap: 11 (ref 5–15)
BUN: 15 mg/dL (ref 6–20)
CO2: 22 mmol/L (ref 22–32)
Calcium: 8.8 mg/dL — ABNORMAL LOW (ref 8.9–10.3)
Chloride: 108 mmol/L (ref 98–111)
Creatinine: 1.03 mg/dL (ref 0.61–1.24)
GFR, Estimated: >60 mL/min (ref >60)
Glucose, Bld: 108 mg/dL — ABNORMAL HIGH (ref 70–99)
Potassium: 3.9 mmol/L (ref 3.5–5.1)
Sodium: 141 mmol/L (ref 135–145)
Total Bilirubin: 0.5 mg/dL (ref 0.3–1.2)
Total Protein: 6.3 g/dL — ABNORMAL LOW (ref 6.5–8.1)

## 2021-09-04 LAB — LACTATE DEHYDROGENASE: LDH: 177 U/L (ref 98–192)

## 2021-09-04 NOTE — Progress Notes (Signed)
[place] [phone]   [phone]  PROGRESS NOTE  Patient Care Team: Redmond School, MD as PCP - General (Internal Medicine)  Hematological/Oncological History # Diffuse Large B Cell Lymphoma, Stage IV 12/17/2020: CT scan at [facility], results show left inguinal adenopathy and multiple solid splenic lesions probably of lymphoid origin  12/26/2020: excisional biopsy of left inguinal lymph node shows DLBCL.  01/04/2021: establish care with Dr. Lorenso Courier  01/09/2021: PET CT scan shows multiple hypermetabolic splenic lesions compatible with lymphoma.  There are also 2 hypermetabolic liver lesions also likely related to lymphoma.  No evidence of hypermetabolic activity in the neck, chest, abdomen or pelvis elsewhere. 01/21/2021: Cycle 1 Day 1 of R-CHOP chemotherapy 02/11/2021: Cycle 2 Day 1 of R-CHOP chemotherapy 03/04/2021: Cycle 3 Day 1 of R-CHOP chemotherapy 03/20/2021: PET CT scan shows excellent response, with resolution of splenic/liver lesions and only Deauville 1 and 2 lesions remaining elsewhere.  03/25/2021: Cycle 4 Day 1 of R-CHOP chemotherapy 04/17/2021: Cycle 5 Day 1 of R-CHOP chemotherapy 05/06/2021: Cycle 6 Day 1 of R-CHOP chemotherapy 05/29/2021: post treatment PET CT scan showed Deauville Score 1 of prior lymph nodes. Consistent with complete metabolic response. Enter surviellance.   Interval History:  Steve Smith 60 y.o. male with medical history significant for Stage IV DLBCL who presents for a follow up visit. The patient's last visit was on 06/05/2021. In the interim since the last visit Steve Smith has continued to improve.  On exam today Steve Smith is accompanied by his wife.  He reports has had marked improvement in his strength since our last visit.  His energy levels are good and he is able to work without difficulty.  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  He notes that his weight has been steadily increasing and he has had good appetite.  His hair is currently returning.  He denies any clear signs of lymphadenopathy or B symptoms.  He denies any fevers, chills, sweats, nausea, vomiting or diarrhea.  A full 10 point ROS is listed below.  MEDICAL HISTORY:  Past Medical History:  Diagnosis Date   Basal cell carcinoma    Hypertension    Kidney stone     SURGICAL HISTORY: Past Surgical History:  Procedure Laterality Date   INGUINAL LYMPH NODE BIOPSY Left 12/26/2020   Procedure: LEFT INGUINAL EXCISIONAL  LYMPH NODE BIOPSY;  Surgeon: Virl Cagey, MD;  Location: [facility];  Service: General;  Laterality: Left;   IR IMAGING GUIDED PORT INSERTION  01/17/2021   IR REMOVAL TUN ACCESS W/ PORT W/O FL MOD SED  06/24/2021   ROTATOR CUFF REPAIR     Left    SOCIAL HISTORY: Social History   Socioeconomic History   Marital status: Married    Spouse name: Not on file   Number of children: Not on file   Years of education: Not on file   Highest education level: Not on file  Occupational History   Not on file  Tobacco Use   Smoking status: Never   Smokeless tobacco: Never  Substance and Sexual Activity   Alcohol use: No   Drug use: No   Sexual activity: Not on file  Other Topics Concern   Not on file  Social History Narrative   Not on file   Social Determinants of Health   Financial Resource Strain: Not on file  Food Insecurity: Not  on file  Transportation Needs: Not on file  Physical Activity: Not on file  Stress: Not on file  Social Connections: Not on file  Intimate Partner Violence: Not on file    FAMILY HISTORY: Family History  Problem Relation Age of Onset   Basal cell carcinoma Mother    Liver cancer Father     ALLERGIES:  has No Known Allergies.  MEDICATIONS:  Current Outpatient Medications  Medication Sig Dispense Refill   olmesartan (BENICAR) 40 MG tablet Take 40 mg by mouth daily.     No current  facility-administered medications for this visit.    REVIEW OF SYSTEMS:   Constitutional: ( - ) fevers, ( - )  chills , ( - ) night sweats Eyes: ( - ) blurriness of vision, ( - ) double vision, ( - ) watery eyes Ears, nose, mouth, throat, and face: ( - ) mucositis, ( - ) sore throat Respiratory: ( - ) cough, ( - ) dyspnea, ( - ) wheezes Cardiovascular: ( - ) palpitation, ( - ) chest discomfort, ( - ) lower extremity swelling Gastrointestinal:  ( - ) nausea, ( - ) heartburn, ( - ) change in bowel habits Skin: ( - ) abnormal skin rashes Lymphatics: ( - ) new lymphadenopathy, ( - ) easy bruising Neurological: ( - ) numbness, ( - ) tingling, ( - ) new weaknesses Behavioral/Psych: ( - ) mood change, ( - ) new changes  All other systems were reviewed with the patient and are negative.  PHYSICAL EXAMINATION: ECOG PERFORMANCE STATUS: 1 - Symptomatic but completely ambulatory  Vitals:   09/04/21 1054  BP: (!) 151/90  Pulse: 89  Resp: 18  Temp: (!) 96.9 F (36.1 C)  SpO2: 99%     Filed Weights   09/04/21 1054  Weight: 285 lb 4.8 oz (129.4 kg)    GENERAL: well appearing middle aged Caucasian male. alert, no distress and comfortable SKIN: skin color, texture, turgor are normal, no rashes or significant lesions EYES: conjunctiva are pink and non-injected, sclera clear LUNGS: clear to auscultation and percussion with normal breathing effort HEART: regular rate & rhythm and no murmurs and no lower extremity edema Musculoskeletal: no cyanosis of digits and no clubbing  PSYCH: alert & oriented x 3, fluent speech NEURO: no focal motor/sensory deficits  LABORATORY DATA:  I have reviewed the data as listed CBC Latest Ref Rng & Units 09/04/2021 06/05/2021 05/06/2021  WBC 4.0 - 10.5 K/uL 3.9(L) 3.4(L) 3.6(L)  Hemoglobin 13.0 - 17.0 g/dL 13.7 10.6(L) 10.2(L)  Hematocrit 39.0 - 52.0 % 39.8 30.8(L) 30.6(L)  Platelets 150 - 400 K/uL 174 162 253    CMP Latest Ref Rng & Units 09/04/2021 06/05/2021  05/06/2021  Glucose 70 - 99 mg/dL 108(H) 95 197(H)  BUN 6 - 20 mg/dL 15 12 18   Creatinine 0.61 - 1.24 mg/dL 1.03 1.09 1.14  Sodium 135 - 145 mmol/L 141 140 139  Potassium 3.5 - 5.1 mmol/L 3.9 4.2 4.3  Chloride 98 - 111 mmol/L 108 107 106  CO2 22 - 32 mmol/L 22 23 24   Calcium 8.9 - 10.3 mg/dL 8.8(L) 9.1 9.6  Total Protein 6.5 - 8.1 g/dL 6.3(L) 6.0(L) 6.3(L)  Total Bilirubin 0.3 - 1.2 mg/dL 0.5 0.4 0.3  Alkaline Phos 38 - 126 U/L 90 67 66  AST 15 - 41 U/L 15 20 14(L)  ALT 0 - 44 U/L 15 14 14     RADIOGRAPHIC STUDIES: No results found.  ASSESSMENT & PLAN Steve Smith  60 y.o. male with medical history significant for Stage IV DLBCL who presents for a follow up visit.  After review the labs, the records, schedule the patient the findings most consistent with a diffuse large B-cell lymphoma with staging in process.  We completed the staging with a PET CT scan which showed involvement of the liver and spleen.  At this time his disease does appear as late stage and we will plan for at least 6 cycles of R-CHOP followed by repeat scans.   Previously we discussed the risks and benefits of R-CHOP chemotherapy.  We noted the nature of diffuse large B-cell lymphoma and the possibility of achieving a complete remission with this chemotherapy regimen.  I also noted the side effects which include but are not limited to nausea, vomiting, hair loss, diarrhea, sedation, insomnia, hyperglycemia, high blood pressure, and cardiotoxicity.  The patient voices understanding of the risk and benefits and was agreeable to proceeding with treatment at this time.   The R-CHOP regimen consisted of rituximab 375 mg per metered squared IV on day 1, cyclophosphamide 750 mg per metered squared on day 1, doxorubicin 50 mg/m on day 1, vincristine 1.4 mg per metered squared with a max dose of 2 mg on day 1, and prednisone 100 mg orally days 1 through 5.  A cycle lasts for 21 days and number of cycles planned is 6.   IPI Score:  3 points. (53% progression free survival).   #Diffuse Large B Cell Lymphoma, Stage IV --Findings at this time are most consistent with a Stage IV diffuse large B-cell lymphoma  He is Stage IV, required 6 cycles of therapy.  --Baseline labs to include CBC, CMP, uric acid and LDH. --patient completed 6 cycles of R-CHOP with complete metabolic response noted on post treatment PET CT scan from 05/29/2021.  Plan: --per NCCN guidelines, plan for clinic visits q3-6 months for the first 2 years, with q 6 month CT scans. Next CT scan due early March 2023 --Have the patient return to clinic in 12 weeks time. Plan for repeat CT scan at that time   #Chemotherapy Induced Anemia, resolved  #Chemotherapy Induced Neutropenia, resolved  -- Hemoglobin today at 13.7.  Trending up.  --White blood cell at 3.9, ANC 2.4.  --Continue to monitor.  # Rash, resolved  #Fever, resolved  -- lesions on resolving --No other focal symptoms.  Patient is not neutropenic and therefore there is no indication for admission and IV antibiotics. --Strict return precautions for worsening fever or new focal symptoms.   No orders of the defined types were placed in this encounter.  All questions were answered. The patient knows to call the clinic with any problems, questions or concerns.  A total of more than 30 minutes were spent on this encounter and over half of that time was spent on counseling and coordination of care as outlined above.   Ledell Peoples, MD Department of Hematology/Oncology [place] at [facility] Phone: [phone] Pager: [phone] Email: [email or web address]  09/04/2021 12:05 PM

## 2021-10-23 IMAGING — PT NM PET TUM IMG INITIAL (PI) SKULL BASE T - THIGH
1 of 7 series · 1 of 25 positions shown · non-contrast
Comparison: Abdominopelvic CT 01/03/2012.  No recent imaging.

CLINICAL DATA: Initial treatment strategy for diffuse large B-cell
lymphoma of inguinal region. Left inguinal nodal biopsy 12/26/2020.

EXAM:
NUCLEAR MEDICINE PET SKULL BASE TO THIGH
TECHNIQUE: 13.4 mCi F-18 FDG was injected intravenously. Full-ring PET imaging
was performed from the skull base to thigh after the radiotracer. CT
data was obtained and used for attenuation correction and anatomic
localization.
Fasting blood glucose: 95 mg/dl

[Series 4: ct sk_thigh 5.0 bf37 · axial · 5.0mm · 0.98mm/px · 1 of 258 slices shown]
[im 258/258  brain]
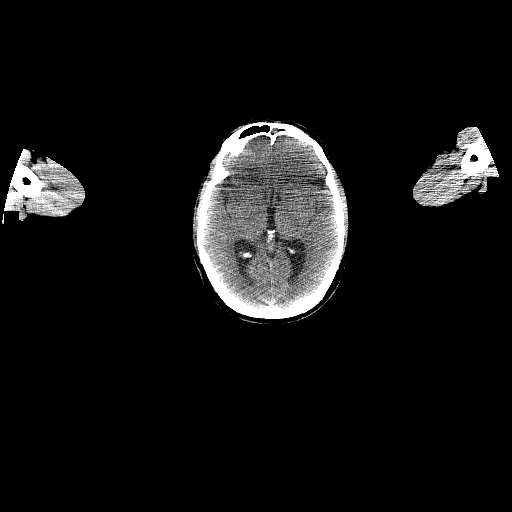

[1 of 25 positions shown; findings below may reference images not displayed]

FINDINGS: Mediastinal blood pool activity: SUV max

Liver activity: SUV max

NECK:

No hypermetabolic cervical lymph nodes are identified.There is mild
asymmetric metabolic activity in the left palatine tonsil (SUV max
5.7) without obvious mucosal abnormality on CT images. The orbital
activity is prominent, but symmetric and likely physiologic as well.

Incidental CT findings: none

CHEST:

There are no hypermetabolic mediastinal, hilar or axillary lymph
nodes. No hypermetabolic pulmonary activity or suspicious
nodularity.

Incidental CT findings: There are calcified mediastinal and left
hilar lymph nodes as well as calcified left lung granulomas. Mild
underlying emphysematous changes.

ABDOMEN/PELVIS:

There is diffuse hepatic steatosis. There are 2 focal hypermetabolic
lesions within the right hepatic lobe which are not visible on the
CT images. The largest lesion is located posteriorly in segment 7
and has an SUV max of 11.4. There is a smaller lesion more
anteriorly in the right lobe which has an SUV max of 7.1. The spleen
is normal in size, although demonstrates multiple hypermetabolic
masses which are faintly visible on the CT images as low-density
lesions. The largest is located superiorly, measuring approximately
3.8 cm on image 98/4 and has an SUV max of 31.8. There are at least
7 splenic lesions. There is a postoperative fluid collection in the
left groin from previous lymphadenectomy, measuring approximately
5.9 x 5.3 cm on image 206/4 which demonstrates mild peripheral
metabolic activity. There are no hypermetabolic lymph nodes in the
abdomen or pelvis. Nonspecific hypermetabolic activity centrally
within the prostate gland which may reflect urine, especially if the
patient has had previous trans urethral resection. This has an SUV
max of 14.6.

Incidental CT findings: Underlying hepatic steatosis. There are
scattered calcified splenic granulomas. Tiny nonobstructing left
renal calculus and mild aortic atherosclerosis. Small bilateral
scrotal hydroceles.

SKELETON:

There is no hypermetabolic activity to suggest osseous metastatic
disease.

Incidental CT findings: none
IMPRESSION: 1. Multiple hypermetabolic splenic lesions compatible with lymphoma
([HOSPITAL] 5). The spleen is not significantly enlarged.
2. Two hypermetabolic liver lesions are nonspecific, but likely
related to lymphoma as well.
3. No hypermetabolic lymph nodes in the neck, chest, abdomen or
pelvis status post left inguinal lymphadenectomy. Minimal asymmetric
activity in the left palatine tonsil, within physiologic limits.
4. Nonspecific central activity in the prostate gland. This could
reflect urine in the prosthetic urethra, especially if the patient
has undergone previous TURP.
5. Incidental findings including sequela of prior granulomatous
disease, hepatic steatosis and a nonobstructing left renal calculus.

## 2021-11-29 ENCOUNTER — Inpatient Hospital Stay: Payer: 59 | Attending: Hematology and Oncology

## 2021-11-29 ENCOUNTER — Ambulatory Visit (HOSPITAL_COMMUNITY)
Admission: RE | Admit: 2021-11-29 | Discharge: 2021-11-29 | Disposition: A | Discharge status: Home / Self Care | Payer: 59 | Source: Ambulatory Visit | Attending: Hematology and Oncology | Admitting: Hematology and Oncology

## 2021-11-29 ENCOUNTER — Other Ambulatory Visit: Payer: Self-pay

## 2021-11-29 ENCOUNTER — Encounter (HOSPITAL_COMMUNITY): Payer: Self-pay

## 2021-11-29 DIAGNOSIS — Z8572 Personal history of non-Hodgkin lymphomas: Secondary | ICD-10-CM | POA: Insufficient documentation

## 2021-11-29 DIAGNOSIS — Z9221 Personal history of antineoplastic chemotherapy: Secondary | ICD-10-CM | POA: Insufficient documentation

## 2021-11-29 DIAGNOSIS — C8335 Diffuse large B-cell lymphoma, lymph nodes of inguinal region and lower limb: Secondary | ICD-10-CM | POA: Diagnosis not present

## 2021-11-29 LAB — CMP (CANCER CENTER ONLY)
ALT: 15 U/L (ref 0–44)
AST: 16 U/L (ref 15–41)
Albumin: 4 g/dL (ref 3.5–5.0)
Alkaline Phosphatase: 91 U/L (ref 38–126)
Anion gap: 6 (ref 5–15)
BUN: 14 mg/dL (ref 6–20)
CO2: 26 mmol/L (ref 22–32)
Calcium: 8.9 mg/dL (ref 8.9–10.3)
Chloride: 108 mmol/L (ref 98–111)
Creatinine: 1.12 mg/dL (ref 0.61–1.24)
GFR, Estimated: >60 mL/min (ref >60)
Glucose, Bld: 126 mg/dL — ABNORMAL HIGH (ref 70–99)
Potassium: 4.2 mmol/L (ref 3.5–5.1)
Sodium: 140 mmol/L (ref 135–145)
Total Bilirubin: 0.6 mg/dL (ref 0.3–1.2)
Total Protein: 6.4 g/dL — ABNORMAL LOW (ref 6.5–8.1)

## 2021-11-29 LAB — CBC WITH DIFFERENTIAL (CANCER CENTER ONLY)
Abs Immature Granulocytes: 0.01 10*3/uL (ref 0.00–0.07)
Basophils Absolute: 0 10*3/uL (ref 0.0–0.1)
Basophils Relative: 1 %
Eosinophils Absolute: 0.4 10*3/uL (ref 0.0–0.5)
Eosinophils Relative: 11 %
HCT: 39.5 % (ref 39.0–52.0)
Hemoglobin: 13.8 g/dL (ref 13.0–17.0)
Immature Granulocytes: 0 %
Lymphocytes Relative: 19 %
Lymphs Abs: 0.6 10*3/uL — ABNORMAL LOW (ref 0.7–4.0)
MCH: 32.4 pg (ref 26.0–34.0)
MCHC: 34.9 g/dL (ref 30.0–36.0)
MCV: 92.7 fL (ref 80.0–100.0)
Monocytes Absolute: 0.3 10*3/uL (ref 0.1–1.0)
Monocytes Relative: 10 %
Neutro Abs: 2 10*3/uL (ref 1.7–7.7)
Neutrophils Relative %: 59 %
Platelet Count: 167 10*3/uL (ref 150–400)
RBC: 4.26 MIL/uL (ref 4.22–5.81)
RDW: 13.9 % (ref 11.5–15.5)
WBC Count: 3.3 10*3/uL — ABNORMAL LOW (ref 4.0–10.5)
nRBC: 0 % (ref 0.0–0.2)

## 2021-11-29 LAB — LACTATE DEHYDROGENASE: LDH: 159 U/L (ref 98–192)

## 2021-11-29 MED ORDER — IOHEXOL 300 MG/ML  SOLN
100.0000 mL | Freq: Once | INTRAMUSCULAR | Status: AC | PRN
Start: 2021-11-29 — End: 2021-11-29
  Administered 2021-11-29: 100 mL via INTRAVENOUS

## 2021-11-29 MED ORDER — SODIUM CHLORIDE (PF) 0.9 % IJ SOLN
INTRAMUSCULAR | Status: AC
  Filled 2021-11-29: qty 50

## 2021-12-02 ENCOUNTER — Telehealth: Payer: Self-pay | Admitting: *Deleted

## 2021-12-02 NOTE — Telephone Encounter (Signed)
-----   Message from Orson Slick, MD sent at 11/30/2021  7:45 PM EST ----- ?Please let Steve Smith know that his CT scan shows no evidence of recurrent disease.  We will be seeing him back on 12/04/2021 ?----- Message ----- ?From: Interface, Rad Results In ?Sent: 11/30/2021   1:15 PM EST ?To: Orson Slick, MD ? ? ?

## 2021-12-02 NOTE — Telephone Encounter (Signed)
TCT patient regarding recent scan results. Spoke with pt and informed him that his recent scan shows no evidence of recurrent disease. Pt pleased with results. He aware of his appt with Dr. Lorenso Courier on 12/04/21 ?

## 2021-12-04 ENCOUNTER — Other Ambulatory Visit: Payer: Self-pay

## 2021-12-04 ENCOUNTER — Inpatient Hospital Stay: Payer: 59 | Admitting: Hematology and Oncology

## 2021-12-04 ENCOUNTER — Inpatient Hospital Stay: Payer: 59

## 2021-12-04 VITALS — BP 158/92 | HR 110 | Temp 97.2°F | Resp 17 | Wt 291.2 lb

## 2021-12-04 DIAGNOSIS — C8335 Diffuse large B-cell lymphoma, lymph nodes of inguinal region and lower limb: Secondary | ICD-10-CM | POA: Diagnosis not present

## 2021-12-04 DIAGNOSIS — Z8572 Personal history of non-Hodgkin lymphomas: Secondary | ICD-10-CM | POA: Diagnosis present

## 2021-12-04 DIAGNOSIS — Z95828 Presence of other vascular implants and grafts: Secondary | ICD-10-CM | POA: Diagnosis not present

## 2021-12-04 DIAGNOSIS — Z9221 Personal history of antineoplastic chemotherapy: Secondary | ICD-10-CM | POA: Diagnosis not present

## 2021-12-04 NOTE — Progress Notes (Signed)
[place] Telephone:(336) [phone]   Fax:(336) [phone]  PROGRESS NOTE  Patient Care Team: Redmond School, MD as PCP - General (Internal Medicine)  Hematological/Oncological History # Diffuse Large B Cell Lymphoma, Stage IV 12/17/2020: CT scan at [facility], results show left inguinal adenopathy and multiple solid splenic lesions probably of lymphoid origin  12/26/2020: excisional biopsy of left inguinal lymph node shows DLBCL.  01/04/2021: establish care with Dr. Lorenso Courier  01/09/2021: PET CT scan shows multiple hypermetabolic splenic lesions compatible with lymphoma.  There are also 2 hypermetabolic liver lesions also likely related to lymphoma.  No evidence of hypermetabolic activity in the neck, chest, abdomen or pelvis elsewhere. 01/21/2021: Cycle 1 Day 1 of R-CHOP chemotherapy 02/11/2021: Cycle 2 Day 1 of R-CHOP chemotherapy 03/04/2021: Cycle 3 Day 1 of R-CHOP chemotherapy 03/20/2021: PET CT scan shows excellent response, with resolution of splenic/liver lesions and only Deauville 1 and 2 lesions remaining elsewhere.  03/25/2021: Cycle 4 Day 1 of R-CHOP chemotherapy 04/17/2021: Cycle 5 Day 1 of R-CHOP chemotherapy 05/06/2021: Cycle 6 Day 1 of R-CHOP chemotherapy 05/29/2021: post treatment PET CT scan showed Deauville Score 1 of prior lymph nodes. Consistent with complete metabolic response. Enter surviellance.  11/29/2021: CT C/A/P showed no evidence of lymphadenopathy in the chest, abdomen, or pelvis. Evidence of prior left inguinal lymph node resection.  Interval History:  Steve Smith 61 y.o. male with medical history significant for Stage IV DLBCL who presents for a follow up visit. The patient's last visit was on 09/04/2021. In the interim since the last visit Steve Smith has continued to improve.  On exam today Steve Smith is accompanied by his wife.  He reports has been well in the interim since her last visit.  He is concerned that his weight continues to increase.  He is up to 291  pounds up from 272 pounds at the end of his chemotherapy treatment.  He reports that he does work 40 hours a week and is quite exhausted when he gets home.  He has not been eating particular well.  He reports his energy levels are good except "at the end of the day".  He denies any lymphadenopathy.  He reports no fevers, chills, sweats or other B symptoms.  He reports his appetite is "solid".  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  He denies any fevers, chills, sweats, nausea, vomiting or diarrhea.  A full 10 point ROS is listed below.  MEDICAL HISTORY:  Past Medical History:  Diagnosis Date   Basal cell carcinoma    Hypertension    Kidney stone     SURGICAL HISTORY: Past Surgical History:  Procedure Laterality Date   INGUINAL LYMPH NODE BIOPSY Left 12/26/2020   Procedure: LEFT INGUINAL EXCISIONAL  LYMPH NODE BIOPSY;  Surgeon: Virl Cagey, MD;  Location: [facility];  Service: General;  Laterality: Left;   IR IMAGING GUIDED PORT INSERTION  01/17/2021   IR REMOVAL TUN ACCESS W/ PORT W/O FL MOD SED  06/24/2021   ROTATOR CUFF REPAIR     Left    SOCIAL HISTORY: Social History   Socioeconomic History   Marital status: Married    Spouse name: Not on file   Number of children: Not on file   Years of education: Not on file   Highest education level: Not on file  Occupational History   Not on file  Tobacco Use  Smoking status: Never   Smokeless tobacco: Never  Substance and Sexual Activity   Alcohol use: No   Drug use: No   Sexual activity: Not on file  Other Topics Concern   Not on file  Social History Narrative   Not on file   Social Determinants of Health   Financial Resource Strain: Not on file  Food Insecurity: Not on file  Transportation Needs: Not on file  Physical Activity: Not on file  Stress: Not on file  Social Connections: Not on file  Intimate Partner Violence: Not on  file    FAMILY HISTORY: Family History  Problem Relation Age of Onset   Basal cell carcinoma Mother    Liver cancer Father     ALLERGIES:  has No Known Allergies.  MEDICATIONS:  Current Outpatient Medications  Medication Sig Dispense Refill   olmesartan (BENICAR) 40 MG tablet Take 40 mg by mouth daily.     No current facility-administered medications for this visit.    REVIEW OF SYSTEMS:   Constitutional: ( - ) fevers, ( - )  chills , ( - ) night sweats Eyes: ( - ) blurriness of vision, ( - ) double vision, ( - ) watery eyes Ears, nose, mouth, throat, and face: ( - ) mucositis, ( - ) sore throat Respiratory: ( - ) cough, ( - ) dyspnea, ( - ) wheezes Cardiovascular: ( - ) palpitation, ( - ) chest discomfort, ( - ) lower extremity swelling Gastrointestinal:  ( - ) nausea, ( - ) heartburn, ( - ) change in bowel habits Skin: ( - ) abnormal skin rashes Lymphatics: ( - ) new lymphadenopathy, ( - ) easy bruising Neurological: ( - ) numbness, ( - ) tingling, ( - ) new weaknesses Behavioral/Psych: ( - ) mood change, ( - ) new changes  All other systems were reviewed with the patient and are negative.  PHYSICAL EXAMINATION: ECOG PERFORMANCE STATUS: 1 - Symptomatic but completely ambulatory  Vitals:   12/04/21 1456  BP: (!) 158/92  Pulse: (!) 110  Resp: 17  Temp: (!) 97.2 F (36.2 C)  SpO2: 98%     Filed Weights   12/04/21 1456  Weight: 291 lb 3.2 oz (132.1 kg)    GENERAL: well appearing middle aged Caucasian male. alert, no distress and comfortable SKIN: skin color, texture, turgor are normal, no rashes or significant lesions EYES: conjunctiva are pink and non-injected, sclera clear LUNGS: clear to auscultation and percussion with normal breathing effort HEART: regular rate & rhythm and no murmurs and no lower extremity edema Musculoskeletal: no cyanosis of digits and no clubbing  PSYCH: alert & oriented x 3, fluent speech NEURO: no focal motor/sensory  deficits  LABORATORY DATA:  I have reviewed the data as listed CBC Latest Ref Rng & Units 11/29/2021 09/04/2021 06/05/2021  WBC 4.0 - 10.5 K/uL 3.3(L) 3.9(L) 3.4(L)  Hemoglobin 13.0 - 17.0 g/dL 13.8 13.7 10.6(L)  Hematocrit 39.0 - 52.0 % 39.5 39.8 30.8(L)  Platelets 150 - 400 K/uL 167 174 162    CMP Latest Ref Rng & Units 11/29/2021 09/04/2021 06/05/2021  Glucose 70 - 99 mg/dL 126(H) 108(H) 95  BUN 6 - 20 mg/dL '14 15 12  '$ Creatinine 0.61 - 1.24 mg/dL 1.12 1.03 1.09  Sodium 135 - 145 mmol/L 140 141 140  Potassium 3.5 - 5.1 mmol/L 4.2 3.9 4.2  Chloride 98 - 111 mmol/L 108 108 107  CO2 22 - 32 mmol/L '26 22 23  '$ Calcium 8.9 - 10.3  mg/dL 8.9 8.8(L) 9.1  Total Protein 6.5 - 8.1 g/dL 6.4(L) 6.3(L) 6.0(L)  Total Bilirubin 0.3 - 1.2 mg/dL 0.6 0.5 0.4  Alkaline Phos 38 - 126 U/L 91 90 67  AST 15 - 41 U/L '16 15 20  '$ ALT 0 - 44 U/L '15 15 14    '$ RADIOGRAPHIC STUDIES: CT CHEST ABDOMEN PELVIS W CONTRAST  Result Date: 11/30/2021 CLINICAL DATA:  Diffuse large B-cell lymphoma, left pelvic lymph nodes, chemotherapy complete EXAM: CT CHEST, ABDOMEN, AND PELVIS WITH CONTRAST TECHNIQUE: Multidetector CT imaging of the chest, abdomen and pelvis was performed following the standard protocol during bolus administration of intravenous contrast. RADIATION DOSE REDUCTION: This exam was performed according to the departmental dose-optimization program which includes automated exposure control, adjustment of the mA and/or kV according to patient size and/or use of iterative reconstruction technique. CONTRAST:  184m OMNIPAQUE IOHEXOL 300 MG/ML  SOLN COMPARISON:  PET-CT, 05/29/2021 FINDINGS: CT CHEST FINDINGS Cardiovascular: No significant vascular findings. Normal heart size. No pericardial effusion. Mediastinum/Nodes: No enlarged mediastinal, hilar, or axillary lymph nodes. Calcified subcarinal and left hilar lymph nodes. Thyroid gland, trachea, and esophagus demonstrate no significant findings. Lungs/Pleura: There is a 0.3 cm  nodule of the peripheral right upper lobe, not clearly visualized on prior free breathing PET-CT examinations (series 4, image 49). Small, benign calcified nodules of the left lower lobe, consistent with prior granulomatous infection. No pleural effusion or pneumothorax. Musculoskeletal: No chest wall mass or suspicious osseous lesions identified. CT ABDOMEN PELVIS FINDINGS Hepatobiliary: No solid liver abnormality is seen. Hepatic steatosis. No gallstones, gallbladder wall thickening, or biliary dilatation. Pancreas: Unremarkable. No pancreatic ductal dilatation or surrounding inflammatory changes. Spleen: Normal in size. Punctuate splenic parenchymal calcifications consistent with prior granulomatous infection. Adrenals/Urinary Tract: Adrenal glands are unremarkable. Small nonobstructive left renal calculus. No right-sided calculi, ureteral calculi or hydronephrosis. Bladder is unremarkable. Stomach/Bowel: Stomach is within normal limits. Appendix appears normal. No evidence of bowel wall thickening, distention, or inflammatory changes. Vascular/Lymphatic: Aortic atherosclerosis. No enlarged abdominal or pelvic lymph nodes. Postoperative findings in the left groin. Reproductive: Mild prostatomegaly. Other: No abdominal wall hernia or abnormality. No ascites. Musculoskeletal: No acute osseous findings. IMPRESSION: 1. No evidence of lymphadenopathy in the chest, abdomen, or pelvis. Evidence of prior left inguinal lymph node resection. 2. There is a 0.3 cm nodule of the peripheral right upper lobe, not clearly visualized on prior free breathing PET-CT examinations, although likely incidental and benign sequelae of prior infection or inflammation. Attention on follow-up. 3. Hepatic steatosis. 4. Nonobstructive left nephrolithiasis. 5. Mild prostatomegaly. Aortic Atherosclerosis (ICD10-I70.0). Electronically Signed   By: ADelanna AhmadiM.D.   On: 11/30/2021 13:12    ASSESSMENT & PLAN Steve TANDY89y.o. male with  medical history significant for Stage IV DLBCL who presents for a follow up visit.  After review the labs, the records, schedule the patient the findings most consistent with a diffuse large B-cell lymphoma with staging in process.  We completed the staging with a PET CT scan which showed involvement of the liver and spleen.  At this time his disease does appear as late stage and we will plan for at least 6 cycles of R-CHOP followed by repeat scans.   Previously we discussed the risks and benefits of R-CHOP chemotherapy.  We noted the nature of diffuse large B-cell lymphoma and the possibility of achieving a complete remission with this chemotherapy regimen.  I also noted the side effects which include but are not limited to nausea, vomiting, hair  loss, diarrhea, sedation, insomnia, hyperglycemia, high blood pressure, and cardiotoxicity.  The patient voices understanding of the risk and benefits and was agreeable to proceeding with treatment at this time.   The R-CHOP regimen consisted of rituximab 375 mg per metered squared IV on day 1, cyclophosphamide 750 mg per metered squared on day 1, doxorubicin 50 mg/m on day 1, vincristine 1.4 mg per metered squared with a max dose of 2 mg on day 1, and prednisone 100 mg orally days 1 through 5.  A cycle lasts for 21 days and number of cycles planned is 6.   IPI Score: 3 points. (53% progression free survival).   #Diffuse Large B Cell Lymphoma, Stage IV --Findings at this time are most consistent with a Stage IV diffuse large B-cell lymphoma  He is Stage IV, required 6 cycles of therapy.  --Baseline labs to include CBC, CMP, uric acid and LDH. --patient completed 6 cycles of R-CHOP with complete metabolic response noted on post treatment PET CT scan from 05/29/2021.  Plan: --per NCCN guidelines, plan for clinic visits q3-6 months for the first 2 years, with q 6 month CT scans. Next CT scan due early Sept 2023 --CT scan on 11/30/2021 show no evidence of  residual or recurrent disease.  --Have the patient return to clinic in 12 weeks time. Plan for repeat CT scan in 6 months time   #Chemotherapy Induced Anemia, resolved  #Chemotherapy Induced Neutropenia, resolved  -- Hemoglobin today at 13.8.  Now steady WNL.  --White blood cell at 3.3, ANC 2.0.  --Continue to monitor.  # Rash, resolved  #Fever, resolved  -- lesions on resolving --No other focal symptoms.  Patient is not neutropenic and therefore there is no indication for admission and IV antibiotics. --Strict return precautions for worsening fever or new focal symptoms.   No orders of the defined types were placed in this encounter.   All questions were answered. The patient knows to call the clinic with any problems, questions or concerns.  A total of more than 30 minutes were spent on this encounter and over half of that time was spent on counseling and coordination of care as outlined above.   Ledell Peoples, MD Department of Hematology/Oncology [place] [facility] Phone: [phone] Pager: [phone] Email: [email or web address]  12/04/2021 4:18 PM

## 2022-03-06 ENCOUNTER — Other Ambulatory Visit: Payer: Self-pay

## 2022-03-06 ENCOUNTER — Inpatient Hospital Stay (HOSPITAL_BASED_OUTPATIENT_CLINIC_OR_DEPARTMENT_OTHER): Payer: 59 | Admitting: Hematology and Oncology

## 2022-03-06 ENCOUNTER — Inpatient Hospital Stay: Payer: 59 | Attending: Hematology and Oncology

## 2022-03-06 ENCOUNTER — Other Ambulatory Visit: Payer: Self-pay | Admitting: Hematology and Oncology

## 2022-03-06 VITALS — BP 147/97 | HR 89 | Temp 97.8°F | Resp 18 | Ht 74.0 in | Wt 292.6 lb

## 2022-03-06 DIAGNOSIS — Z8572 Personal history of non-Hodgkin lymphomas: Secondary | ICD-10-CM | POA: Insufficient documentation

## 2022-03-06 DIAGNOSIS — C8335 Diffuse large B-cell lymphoma, lymph nodes of inguinal region and lower limb: Secondary | ICD-10-CM

## 2022-03-06 DIAGNOSIS — I10 Essential (primary) hypertension: Secondary | ICD-10-CM | POA: Insufficient documentation

## 2022-03-06 DIAGNOSIS — Z79899 Other long term (current) drug therapy: Secondary | ICD-10-CM | POA: Diagnosis not present

## 2022-03-06 DIAGNOSIS — G629 Polyneuropathy, unspecified: Secondary | ICD-10-CM | POA: Diagnosis not present

## 2022-03-06 LAB — CBC WITH DIFFERENTIAL (CANCER CENTER ONLY)
Abs Immature Granulocytes: 0.01 10*3/uL (ref 0.00–0.07)
Basophils Absolute: 0 10*3/uL (ref 0.0–0.1)
Basophils Relative: 1 %
Eosinophils Absolute: 0.3 10*3/uL (ref 0.0–0.5)
Eosinophils Relative: 6 %
HCT: 41.6 % (ref 39.0–52.0)
Hemoglobin: 14.2 g/dL (ref 13.0–17.0)
Immature Granulocytes: 0 %
Lymphocytes Relative: 22 %
Lymphs Abs: 1 10*3/uL (ref 0.7–4.0)
MCH: 31.8 pg (ref 26.0–34.0)
MCHC: 34.1 g/dL (ref 30.0–36.0)
MCV: 93.3 fL (ref 80.0–100.0)
Monocytes Absolute: 0.5 10*3/uL (ref 0.1–1.0)
Monocytes Relative: 10 %
Neutro Abs: 2.9 10*3/uL (ref 1.7–7.7)
Neutrophils Relative %: 61 %
Platelet Count: 196 10*3/uL (ref 150–400)
RBC: 4.46 MIL/uL (ref 4.22–5.81)
RDW: 13.4 % (ref 11.5–15.5)
WBC Count: 4.7 10*3/uL (ref 4.0–10.5)
nRBC: 0 % (ref 0.0–0.2)

## 2022-03-06 LAB — CMP (CANCER CENTER ONLY)
ALT: 16 U/L (ref 0–44)
AST: 17 U/L (ref 15–41)
Albumin: 4.2 g/dL (ref 3.5–5.0)
Alkaline Phosphatase: 85 U/L (ref 38–126)
Anion gap: 8 (ref 5–15)
BUN: 17 mg/dL (ref 6–20)
CO2: 25 mmol/L (ref 22–32)
Calcium: 9.5 mg/dL (ref 8.9–10.3)
Chloride: 107 mmol/L (ref 98–111)
Creatinine: 1.12 mg/dL (ref 0.61–1.24)
GFR, Estimated: >60 mL/min (ref >60)
Glucose, Bld: 94 mg/dL (ref 70–99)
Potassium: 3.8 mmol/L (ref 3.5–5.1)
Sodium: 140 mmol/L (ref 135–145)
Total Bilirubin: 0.5 mg/dL (ref 0.3–1.2)
Total Protein: 6.7 g/dL (ref 6.5–8.1)

## 2022-03-06 LAB — LACTATE DEHYDROGENASE: LDH: 180 U/L (ref 98–192)

## 2022-03-06 NOTE — Progress Notes (Signed)
[place] [phone]   Fax:([phone]  PROGRESS NOTE  Patient Care Team: Redmond School, MD as PCP - General (Internal Medicine)  Hematological/Oncological History # Diffuse Large B Cell Lymphoma, Stage IV 12/17/2020: CT scan at [facility], results show left inguinal adenopathy and multiple solid splenic lesions probably of lymphoid origin  12/26/2020: excisional biopsy of left inguinal lymph node shows DLBCL.  01/04/2021: establish care with Dr. Lorenso Courier  01/09/2021: PET CT scan shows multiple hypermetabolic splenic lesions compatible with lymphoma.  There are also 2 hypermetabolic liver lesions also likely related to lymphoma.  No evidence of hypermetabolic activity in the neck, chest, abdomen or pelvis elsewhere. 01/21/2021: Cycle 1 Day 1 of R-CHOP chemotherapy 02/11/2021: Cycle 2 Day 1 of R-CHOP chemotherapy 03/04/2021: Cycle 3 Day 1 of R-CHOP chemotherapy 03/20/2021: PET CT scan shows excellent response, with resolution of splenic/liver lesions and only Deauville 1 and 2 lesions remaining elsewhere.  03/25/2021: Cycle 4 Day 1 of R-CHOP chemotherapy 04/17/2021: Cycle 5 Day 1 of R-CHOP chemotherapy 05/06/2021: Cycle 6 Day 1 of R-CHOP chemotherapy 05/29/2021: post treatment PET CT scan showed Deauville Score 1 of prior lymph nodes. Consistent with complete metabolic response. Enter surviellance.  11/29/2021: CT C/A/P showed no evidence of lymphadenopathy in the chest, abdomen, or pelvis. Evidence of prior left inguinal lymph node resection.  Interval History:  Steve Smith 61 y.o. male with medical history significant for Stage IV DLBCL who presents for a follow up visit. The patient's last visit was on 11/29/2021. In the interim since the last visit Steve Smith has continued to improve.  On exam today Steve Smith is accompanied by his wife.  He reports he has been well in the interim since her last visit.  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  He reports  that he did describe it as an occasional numbness and tingling but no frank pain.  This has not caused him any instability of his gait or falls.  He also reports he inspects his lymph nodes weekly and has not noticed any lumps or bumps.  He notes his energy levels are good though he does experience some days of fatigue which are easy to recover from with a full nights rest.  He reports that his appetite is good and his weight has been stable at 292 pounds.  He denies any fevers, chills, sweats, nausea, vomiting or diarrhea.  A full 10 point ROS is listed below.  MEDICAL HISTORY:  Past Medical History:  Diagnosis Date   Basal cell carcinoma    Hypertension    Kidney stone     SURGICAL HISTORY: Past Surgical History:  Procedure Laterality Date   INGUINAL LYMPH NODE BIOPSY Left 12/26/2020   Procedure: LEFT INGUINAL EXCISIONAL  LYMPH NODE BIOPSY;  Surgeon: Virl Cagey, MD;  Location: [facility];  Service: General;  Laterality: Left;   IR IMAGING GUIDED PORT INSERTION  01/17/2021   IR REMOVAL TUN ACCESS W/ PORT W/O FL MOD SED  06/24/2021   ROTATOR CUFF REPAIR     Left    SOCIAL HISTORY: Social History   Socioeconomic History   Marital status: Married    Spouse name: Not on file   Number of children: Not on file   Years of education: Not on file   Highest education level: Not on file  Occupational History   Not on file  Tobacco Use   Smoking status: Never   Smokeless tobacco: Never  Substance and Sexual  Activity   Alcohol use: No   Drug use: No   Sexual activity: Not on file  Other Topics Concern   Not on file  Social History Narrative   Not on file   Social Determinants of Health   Financial Resource Strain: Not on file  Food Insecurity: Not on file  Transportation Needs: Not on file  Physical Activity: Not on file  Stress: Not on file  Social Connections: Not on file  Intimate Partner Violence: Not on file    FAMILY HISTORY: Family History  Problem Relation Age of  Onset   Basal cell carcinoma Mother    Liver cancer Father     ALLERGIES:  has No Known Allergies.  MEDICATIONS:  Current Outpatient Medications  Medication Sig Dispense Refill   olmesartan (BENICAR) 40 MG tablet Take 40 mg by mouth daily.     No current facility-administered medications for this visit.    REVIEW OF SYSTEMS:   Constitutional: ( - ) fevers, ( - )  chills , ( - ) night sweats Eyes: ( - ) blurriness of vision, ( - ) double vision, ( - ) watery eyes Ears, nose, mouth, throat, and face: ( - ) mucositis, ( - ) sore throat Respiratory: ( - ) cough, ( - ) dyspnea, ( - ) wheezes Cardiovascular: ( - ) palpitation, ( - ) chest discomfort, ( - ) lower extremity swelling Gastrointestinal:  ( - ) nausea, ( - ) heartburn, ( - ) change in bowel habits Skin: ( - ) abnormal skin rashes Lymphatics: ( - ) new lymphadenopathy, ( - ) easy bruising Neurological: ( - ) numbness, ( - ) tingling, ( - ) new weaknesses Behavioral/Psych: ( - ) mood change, ( - ) new changes  All other systems were reviewed with the patient and are negative.  PHYSICAL EXAMINATION: ECOG PERFORMANCE STATUS: 1 - Symptomatic but completely ambulatory  Vitals:   03/06/22 1502  BP: (!) 147/97  Pulse: 89  Resp: 18  Temp: 97.8 F (36.6 C)  SpO2: 100%     Filed Weights   03/06/22 1502  Weight: 292 lb 9.6 oz (132.7 kg)    GENERAL: well appearing middle aged Caucasian male. alert, no distress and comfortable SKIN: skin color, texture, turgor are normal, no rashes or significant lesions EYES: conjunctiva are pink and non-injected, sclera clear LUNGS: clear to auscultation and percussion with normal breathing effort HEART: regular rate & rhythm and no murmurs and no lower extremity edema Musculoskeletal: no cyanosis of digits and no clubbing  PSYCH: alert & oriented x 3, fluent speech NEURO: no focal motor/sensory deficits  LABORATORY DATA:  I have reviewed the data as listed    Latest Ref Rng &  Units 03/06/2022    2:23 PM 11/29/2021    8:13 AM 09/04/2021   10:35 AM  CBC  WBC 4.0 - 10.5 K/uL 4.7  3.3  3.9   Hemoglobin 13.0 - 17.0 g/dL 14.2  13.8  13.7   Hematocrit 39.0 - 52.0 % 41.6  39.5  39.8   Platelets 150 - 400 K/uL 196  167  174        Latest Ref Rng & Units 03/06/2022    2:23 PM 11/29/2021    8:13 AM 09/04/2021   10:35 AM  CMP  Glucose 70 - 99 mg/dL 94  126  108   BUN 6 - 20 mg/dL '17  14  15   '$ Creatinine 0.61 - 1.24 mg/dL 1.12  1.12  1.03   Sodium 135 - 145 mmol/L 140  140  141   Potassium 3.5 - 5.1 mmol/L 3.8  4.2  3.9   Chloride 98 - 111 mmol/L 107  108  108   CO2 22 - 32 mmol/L '25  26  22   '$ Calcium 8.9 - 10.3 mg/dL 9.5  8.9  8.8   Total Protein 6.5 - 8.1 g/dL 6.7  6.4  6.3   Total Bilirubin 0.3 - 1.2 mg/dL 0.5  0.6  0.5   Alkaline Phos 38 - 126 U/L 85  91  90   AST 15 - 41 U/L '17  16  15   '$ ALT 0 - 44 U/L '16  15  15     '$ RADIOGRAPHIC STUDIES: No results found.  ASSESSMENT & PLAN Steve Smith 61 y.o. male with medical history significant for Stage IV DLBCL who presents for a follow up visit.  After review the labs, the records, schedule the patient the findings most consistent with a diffuse large B-cell lymphoma with staging in process.  We completed the staging with a PET CT scan which showed involvement of the liver and spleen.  At this time his disease does appear as late stage and we will plan for at least 6 cycles of R-CHOP followed by repeat scans.   Previously we discussed the risks and benefits of R-CHOP chemotherapy.  We noted the nature of diffuse large B-cell lymphoma and the possibility of achieving a complete remission with this chemotherapy regimen.  I also noted the side effects which include but are not limited to nausea, vomiting, hair loss, diarrhea, sedation, insomnia, hyperglycemia, high blood pressure, and cardiotoxicity.  The patient voices understanding of the risk and benefits and was agreeable to proceeding with treatment at this time.   The  R-CHOP regimen consisted of rituximab 375 mg per metered squared IV on day 1, cyclophosphamide 750 mg per metered squared on day 1, doxorubicin 50 mg/m on day 1, vincristine 1.4 mg per metered squared with a max dose of 2 mg on day 1, and prednisone 100 mg orally days 1 through 5.  A cycle lasts for 21 days and number of cycles planned is 6.   IPI Score: 3 points. (53% progression free survival).   #Diffuse Large B Cell Lymphoma, Stage IV --Findings at this time are most consistent with a Stage IV diffuse large B-cell lymphoma  He is Stage IV, required 6 cycles of therapy.  --Baseline labs to include CBC, CMP, uric acid and LDH. --patient completed 6 cycles of R-CHOP with complete metabolic response noted on post treatment PET CT scan from 05/29/2021.  Plan: --per NCCN guidelines, plan for clinic visits q3-6 months for the first 2 years, with q 6 month CT scans. Next CT scan due early Sept 2023 --CT scan on 11/30/2021 show no evidence of residual or recurrent disease.  --Have the patient return to clinic in 12 weeks time. Plan for repeat CT scan prior to that visit   #Chemotherapy Induced Anemia, resolved  #Chemotherapy Induced Neutropenia, resolved  -- Hemoglobin today at 14.2.  Now steady WNL.  --White blood cell at 4.7, ANC 2.9.  --Continue to monitor.  # Rash, resolved  #Fever, resolved  -- lesions on resolving --No other focal symptoms.  Patient is not neutropenic and therefore there is no indication for admission and IV antibiotics. --Strict return precautions for worsening fever or new focal symptoms.   No orders of the defined types were placed in  this encounter.   All questions were answered. The patient knows to call the clinic with any problems, questions or concerns.  A total of more than 30 minutes were spent on this encounter and over half of that time was spent on counseling and coordination of care as outlined above.   Ledell Peoples, MD Department of  Hematology/Oncology [place] at [facility] Phone: [phone] Pager: [phone] Email: [email or web address]  03/06/2022 3:25 PM

## 2022-04-21 ENCOUNTER — Other Ambulatory Visit: Payer: Self-pay

## 2022-05-12 ENCOUNTER — Other Ambulatory Visit: Payer: Self-pay

## 2022-05-28 ENCOUNTER — Ambulatory Visit (HOSPITAL_COMMUNITY)
Admission: RE | Admit: 2022-05-28 | Discharge: 2022-05-28 | Disposition: A | Discharge status: Home / Self Care | Payer: 59 | Source: Ambulatory Visit | Attending: Hematology and Oncology | Admitting: Hematology and Oncology

## 2022-05-28 ENCOUNTER — Encounter (HOSPITAL_COMMUNITY): Payer: Self-pay

## 2022-05-28 DIAGNOSIS — C8335 Diffuse large B-cell lymphoma, lymph nodes of inguinal region and lower limb: Secondary | ICD-10-CM | POA: Diagnosis present

## 2022-05-28 MED ORDER — IOHEXOL 300 MG/ML  SOLN
100.0000 mL | Freq: Once | INTRAMUSCULAR | Status: AC | PRN
  Administered 2022-05-28: 100 mL via INTRAVENOUS

## 2022-05-28 MED ORDER — SODIUM CHLORIDE (PF) 0.9 % IJ SOLN
INTRAMUSCULAR | Status: AC
  Filled 2022-05-28: qty 50

## 2022-06-04 ENCOUNTER — Other Ambulatory Visit: Payer: Self-pay

## 2022-06-04 DIAGNOSIS — C8335 Diffuse large B-cell lymphoma, lymph nodes of inguinal region and lower limb: Secondary | ICD-10-CM

## 2022-06-05 ENCOUNTER — Inpatient Hospital Stay: Payer: 59 | Attending: Hematology and Oncology

## 2022-06-05 ENCOUNTER — Other Ambulatory Visit: Payer: Self-pay

## 2022-06-05 ENCOUNTER — Inpatient Hospital Stay: Payer: 59 | Admitting: Hematology and Oncology

## 2022-06-05 VITALS — BP 147/89 | HR 92 | Temp 97.6°F | Resp 17 | Wt 292.7 lb

## 2022-06-05 DIAGNOSIS — C8335 Diffuse large B-cell lymphoma, lymph nodes of inguinal region and lower limb: Secondary | ICD-10-CM

## 2022-06-05 DIAGNOSIS — Z8572 Personal history of non-Hodgkin lymphomas: Secondary | ICD-10-CM | POA: Diagnosis present

## 2022-06-05 DIAGNOSIS — G629 Polyneuropathy, unspecified: Secondary | ICD-10-CM | POA: Insufficient documentation

## 2022-06-05 DIAGNOSIS — Z9221 Personal history of antineoplastic chemotherapy: Secondary | ICD-10-CM | POA: Insufficient documentation

## 2022-06-05 LAB — CBC WITH DIFFERENTIAL (CANCER CENTER ONLY)
Abs Immature Granulocytes: 0.01 10*3/uL (ref 0.00–0.07)
Basophils Absolute: 0 10*3/uL (ref 0.0–0.1)
Basophils Relative: 0 %
Eosinophils Absolute: 0.2 10*3/uL (ref 0.0–0.5)
Eosinophils Relative: 4 %
HCT: 41.3 % (ref 39.0–52.0)
Hemoglobin: 14.5 g/dL (ref 13.0–17.0)
Immature Granulocytes: 0 %
Lymphocytes Relative: 20 %
Lymphs Abs: 1 10*3/uL (ref 0.7–4.0)
MCH: 32.6 pg (ref 26.0–34.0)
MCHC: 35.1 g/dL (ref 30.0–36.0)
MCV: 92.8 fL (ref 80.0–100.0)
Monocytes Absolute: 0.4 10*3/uL (ref 0.1–1.0)
Monocytes Relative: 8 %
Neutro Abs: 3.5 10*3/uL (ref 1.7–7.7)
Neutrophils Relative %: 68 %
Platelet Count: 207 10*3/uL (ref 150–400)
RBC: 4.45 MIL/uL (ref 4.22–5.81)
RDW: 13.2 % (ref 11.5–15.5)
WBC Count: 5 10*3/uL (ref 4.0–10.5)
nRBC: 0 % (ref 0.0–0.2)

## 2022-06-05 LAB — CMP (CANCER CENTER ONLY)
ALT: 16 U/L (ref 0–44)
AST: 16 U/L (ref 15–41)
Albumin: 4.2 g/dL (ref 3.5–5.0)
Alkaline Phosphatase: 80 U/L (ref 38–126)
Anion gap: 7 (ref 5–15)
BUN: 18 mg/dL (ref 6–20)
CO2: 25 mmol/L (ref 22–32)
Calcium: 9.6 mg/dL (ref 8.9–10.3)
Chloride: 107 mmol/L (ref 98–111)
Creatinine: 1.17 mg/dL (ref 0.61–1.24)
GFR, Estimated: >60 mL/min (ref >60)
Glucose, Bld: 93 mg/dL (ref 70–99)
Potassium: 3.8 mmol/L (ref 3.5–5.1)
Sodium: 139 mmol/L (ref 135–145)
Total Bilirubin: 0.7 mg/dL (ref 0.3–1.2)
Total Protein: 6.8 g/dL (ref 6.5–8.1)

## 2022-06-05 LAB — LACTATE DEHYDROGENASE: LDH: 160 U/L (ref 98–192)

## 2022-06-05 LAB — URIC ACID: Uric Acid, Serum: 6.2 mg/dL (ref 3.7–8.6)

## 2022-06-05 NOTE — Progress Notes (Signed)
[place] Telephone:(336) [phone]   [phone]  PROGRESS NOTE  Patient Care Team: Redmond School, MD as PCP - General (Internal Medicine)  Hematological/Oncological History # Diffuse Large B Cell Lymphoma, Stage IV 12/17/2020: CT scan at [facility], results show left inguinal adenopathy and multiple solid splenic lesions probably of lymphoid origin  12/26/2020: excisional biopsy of left inguinal lymph node shows DLBCL.  01/04/2021: establish care with Dr. Lorenso Courier  01/09/2021: PET CT scan shows multiple hypermetabolic splenic lesions compatible with lymphoma.  There are also 2 hypermetabolic liver lesions also likely related to lymphoma.  No evidence of hypermetabolic activity in the neck, chest, abdomen or pelvis elsewhere. 01/21/2021: Cycle 1 Day 1 of R-CHOP chemotherapy 02/11/2021: Cycle 2 Day 1 of R-CHOP chemotherapy 03/04/2021: Cycle 3 Day 1 of R-CHOP chemotherapy 03/20/2021: PET CT scan shows excellent response, with resolution of splenic/liver lesions and only Deauville 1 and 2 lesions remaining elsewhere.  03/25/2021: Cycle 4 Day 1 of R-CHOP chemotherapy 04/17/2021: Cycle 5 Day 1 of R-CHOP chemotherapy 05/06/2021: Cycle 6 Day 1 of R-CHOP chemotherapy 05/29/2021: post treatment PET CT scan showed Deauville Score 1 of prior lymph nodes. Consistent with complete metabolic response. Enter surviellance.  11/29/2021: CT C/A/P showed no evidence of lymphadenopathy in the chest, abdomen, or pelvis. Evidence of prior left inguinal lymph node resection. 05/28/2022: CT C/A/P showed no evidence of recurrent disease in the chest, abdomen, or pelvis  Interval History:  Steve Smith 61 y.o. male with medical history significant for Stage IV DLBCL who presents for a follow up visit. The patient's last visit was on 11/29/2021. In the interim since the last visit Steve Smith has continued to improve.  On exam today Steve Smith is accompanied by his wife.  He reports he has been "feeling good" in  the interim since her last visit.  He reports his energy levels are strong and his appetite has been good.  His weight is stable at 292 pounds.  His goal is to get down to 230 pounds eventually.  He reports that with his last CT scan on 05/28/2022 he developed welts on his back due to the IV contrast.  He was itching and sore and lasted for about 5 to 6 days.  He reports it is predominantly on his chest and back.  He reports he has had no other major symptoms.  He denies any bumps or lumps concerning for recurrent lymphoma.  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  He denies any fevers, chills, sweats, nausea, vomiting or diarrhea.  A full 10 point ROS is listed below.  MEDICAL HISTORY:  Past Medical History:  Diagnosis Date   Basal cell carcinoma    Hypertension    Kidney stone     SURGICAL HISTORY: Past Surgical History:  Procedure Laterality Date   INGUINAL LYMPH NODE BIOPSY Left 12/26/2020   Procedure: LEFT INGUINAL EXCISIONAL  LYMPH NODE BIOPSY;  Surgeon: Virl Cagey, MD;  Location: [facility];  Service: General;  Laterality: Left;   IR IMAGING GUIDED PORT INSERTION  01/17/2021   IR REMOVAL TUN ACCESS W/ PORT W/O FL MOD SED  06/24/2021   ROTATOR CUFF REPAIR     Left    SOCIAL HISTORY: Social History   Socioeconomic History   Marital status: Married    Spouse name: Not on file   Number of children: Not on file   Years of education: Not on  file   Highest education level: Not on file  Occupational History   Not on file  Tobacco Use   Smoking status: Never   Smokeless tobacco: Never  Substance and Sexual Activity   Alcohol use: No   Drug use: No   Sexual activity: Not on file  Other Topics Concern   Not on file  Social History Narrative   Not on file   Social Determinants of Health   Financial Resource Strain: Not on file  Food Insecurity: Not on file  Transportation Needs: Not on file  Physical  Activity: Not on file  Stress: Not on file  Social Connections: Not on file  Intimate Partner Violence: Not on file    FAMILY HISTORY: Family History  Problem Relation Age of Onset   Basal cell carcinoma Mother    Liver cancer Father     ALLERGIES:  is allergic to ivp dye [iodinated contrast media].  MEDICATIONS:  Current Outpatient Medications  Medication Sig Dispense Refill   olmesartan (BENICAR) 40 MG tablet Take 40 mg by mouth daily.     No current facility-administered medications for this visit.    REVIEW OF SYSTEMS:   Constitutional: ( - ) fevers, ( - )  chills , ( - ) night sweats Eyes: ( - ) blurriness of vision, ( - ) double vision, ( - ) watery eyes Ears, nose, mouth, throat, and face: ( - ) mucositis, ( - ) sore throat Respiratory: ( - ) cough, ( - ) dyspnea, ( - ) wheezes Cardiovascular: ( - ) palpitation, ( - ) chest discomfort, ( - ) lower extremity swelling Gastrointestinal:  ( - ) nausea, ( - ) heartburn, ( - ) change in bowel habits Skin: ( - ) abnormal skin rashes Lymphatics: ( - ) new lymphadenopathy, ( - ) easy bruising Neurological: ( - ) numbness, ( - ) tingling, ( - ) new weaknesses Behavioral/Psych: ( - ) mood change, ( - ) new changes  All other systems were reviewed with the patient and are negative.  PHYSICAL EXAMINATION: ECOG PERFORMANCE STATUS: 1 - Symptomatic but completely ambulatory  Vitals:   06/05/22 1541  BP: (!) 147/89  Pulse: 92  Resp: 17  Temp: 97.6 F (36.4 C)  SpO2: 98%     Filed Weights   06/05/22 1541  Weight: 292 lb 11.2 oz (132.8 kg)    GENERAL: well appearing middle aged Caucasian male. alert, no distress and comfortable SKIN: skin color, texture, turgor are normal, no rashes or significant lesions EYES: conjunctiva are pink and non-injected, sclera clear LUNGS: clear to auscultation and percussion with normal breathing effort HEART: regular rate & rhythm and no murmurs and no lower extremity  edema Musculoskeletal: no cyanosis of digits and no clubbing  PSYCH: alert & oriented x 3, fluent speech NEURO: no focal motor/sensory deficits  LABORATORY DATA:  I have reviewed the data as listed    Latest Ref Rng & Units 06/05/2022    2:49 PM 03/06/2022    2:23 PM 11/29/2021    8:13 AM  CBC  WBC 4.0 - 10.5 K/uL 5.0  4.7  3.3   Hemoglobin 13.0 - 17.0 g/dL 14.5  14.2  13.8   Hematocrit 39.0 - 52.0 % 41.3  41.6  39.5   Platelets 150 - 400 K/uL 207  196  167        Latest Ref Rng & Units 06/05/2022    2:49 PM 03/06/2022    2:23 PM 11/29/2021  8:13 AM  CMP  Glucose 70 - 99 mg/dL 93  94  126   BUN 6 - 20 mg/dL '18  17  14   '$ Creatinine 0.61 - 1.24 mg/dL 1.17  1.12  1.12   Sodium 135 - 145 mmol/L 139  140  140   Potassium 3.5 - 5.1 mmol/L 3.8  3.8  4.2   Chloride 98 - 111 mmol/L 107  107  108   CO2 22 - 32 mmol/L '25  25  26   '$ Calcium 8.9 - 10.3 mg/dL 9.6  9.5  8.9   Total Protein 6.5 - 8.1 g/dL 6.8  6.7  6.4   Total Bilirubin 0.3 - 1.2 mg/dL 0.7  0.5  0.6   Alkaline Phos 38 - 126 U/L 80  85  91   AST 15 - 41 U/L '16  17  16   '$ ALT 0 - 44 U/L '16  16  15     '$ RADIOGRAPHIC STUDIES: CT CHEST ABDOMEN PELVIS W CONTRAST  Result Date: 05/28/2022 CLINICAL DATA:  History of lymphoma diagnosed 2022, chemotherapy complete. Follow-up/surveillance. * Tracking Code: BO * EXAM: CT CHEST, ABDOMEN, AND PELVIS WITH CONTRAST TECHNIQUE: Multidetector CT imaging of the chest, abdomen and pelvis was performed following the standard protocol during bolus administration of intravenous contrast. RADIATION DOSE REDUCTION: This exam was performed according to the departmental dose-optimization program which includes automated exposure control, adjustment of the mA and/or kV according to patient size and/or use of iterative reconstruction technique. CONTRAST:  179m OMNIPAQUE IOHEXOL 300 MG/ML  SOLN COMPARISON:  Multiple priors including most recent CT chest abdomen and pelvis dated November 29, 2021 FINDINGS: CT CHEST  FINDINGS Cardiovascular: Normal caliber thoracic aorta. No central pulmonary embolus on this nondedicated study. Normal size heart. No significant pericardial effusion/thickening. Mediastinum/Nodes: No supraclavicular adenopathy. No suspicious thyroid nodule. No pathologically enlarged mediastinal, hilar or axillary lymph nodes. Calcified subcarinal and left hilar lymph nodes. Diffuse symmetric esophageal wall thickening. Lungs/Pleura: No suspicious pulmonary nodules or masses. Interval resolution of the previously identified 3 mm right upper lobe pulmonary nodule. Small calcified granulomata in the left lower lobe. No pleural effusion. No pneumothorax. Musculoskeletal: No aggressive lytic or blastic lesion of bone. Multilevel degenerative change of the spine. CT ABDOMEN PELVIS FINDINGS Hepatobiliary: Diffuse hepatic steatosis. No suspicious hepatic lesion. Gallbladder is unremarkable. No biliary ductal dilation. Pancreas: No pancreatic ductal dilation or evidence of acute inflammation. Spleen: No splenomegaly.  Calcified splenic granulomata. Adrenals/Urinary Tract: Bilateral adrenal glands appear normal. No hydronephrosis. Punctate nonobstructive left interpolar renal stone. Urinary bladder is unremarkable for degree of distension. Stomach/Bowel: Radiopaque enteric contrast material traverses the splenic flexure. Stomach is unremarkable for degree of distension. No pathologic dilation of small or large bowel. The appendix and terminal ileum appear normal. No evidence of acute bowel inflammation. Moderate volume of formed stool throughout the colon suggestive of constipation. Vascular/Lymphatic: Normal caliber abdominal aorta. No pathologically enlarged abdominal or pelvic lymph nodes. Reproductive: Prostate is unremarkable. Other: No significant abdominopelvic free fluid. Prior left inguinal lymph node dissection. Musculoskeletal: No aggressive lytic or blastic lesion of bone. IMPRESSION: 1. No adenopathy above or  below the diaphragm and no splenomegaly to suggest lymphomatous disease recurrence. 2. Hepatic steatosis. 3. Nonobstructive punctate left renal stone. 4.  Aortic Atherosclerosis (ICD10-I70.0). Electronically Signed   By: JDahlia BailiffM.D.   On: 05/28/2022 08:26    ASSESSMENT & PLAN DMIGUELANGEL KORN89y.o. male with medical history significant for Stage IV DLBCL who presents for a  follow up visit.  After review the labs, the records, schedule the patient the findings most consistent with a diffuse large B-cell lymphoma with staging in process.  We completed the staging with a PET CT scan which showed involvement of the liver and spleen.  At this time his disease does appear as late stage and we will plan for at least 6 cycles of R-CHOP followed by repeat scans.   Previously we discussed the risks and benefits of R-CHOP chemotherapy.  We noted the nature of diffuse large B-cell lymphoma and the possibility of achieving a complete remission with this chemotherapy regimen.  I also noted the side effects which include but are not limited to nausea, vomiting, hair loss, diarrhea, sedation, insomnia, hyperglycemia, high blood pressure, and cardiotoxicity.  The patient voices understanding of the risk and benefits and was agreeable to proceeding with treatment at this time.   The R-CHOP regimen consisted of rituximab 375 mg per metered squared IV on day 1, cyclophosphamide 750 mg per metered squared on day 1, doxorubicin 50 mg/m on day 1, vincristine 1.4 mg per metered squared with a max dose of 2 mg on day 1, and prednisone 100 mg orally days 1 through 5.  A cycle lasts for 21 days and number of cycles planned is 6.   IPI Score: 3 points. (53% progression free survival).   #Diffuse Large B Cell Lymphoma, Stage IV --Findings at this time are most consistent with a Stage IV diffuse large B-cell lymphoma  He is Stage IV, required 6 cycles of therapy.  --Baseline labs to include CBC, CMP, uric acid and  LDH. --patient completed 6 cycles of R-CHOP with complete metabolic response noted on post treatment PET CT scan from 05/29/2021.  Plan: --per NCCN guidelines, plan for clinic visits q3-6 months for the first 2 years, with q 6 month CT scans.  --CT scan on 05/28/2022 show no evidence of residual or recurrent disease. Next scan in early March 2024 --Labs today show white blood cell count 5.0, hemoglobin 14.5, platelets of 207. --Have the patient return to clinic in 3 months time. Plan for repeat CT scan in March 2024  # Contrast Allergy -- Developed welts and hives after his last CT scan on 05/28/2022. -- Patient will require premedication prior to CT scans moving forward.   # Rash, resolved  #Fever, resolved  -- lesions on resolving --No other focal symptoms.  Patient is not neutropenic and therefore there is no indication for admission and IV antibiotics. --Strict return precautions for worsening fever or new focal symptoms.   No orders of the defined types were placed in this encounter.   All questions were answered. The patient knows to call the clinic with any problems, questions or concerns.  A total of more than 30 minutes were spent on this encounter and over half of that time was spent on counseling and coordination of care as outlined above.   Ledell Peoples, MD Department of Hematology/Oncology [place] at [facility] Phone: [phone] Pager: [phone] Email: [email or web address]  06/05/2022 5:02 PM

## 2022-06-06 ENCOUNTER — Telehealth: Payer: Self-pay | Admitting: Hematology and Oncology

## 2022-06-06 NOTE — Telephone Encounter (Signed)
Per 9/7 los called and spoke to pt about appointment

## 2022-09-11 ENCOUNTER — Inpatient Hospital Stay: Payer: 59 | Admitting: Hematology and Oncology

## 2022-09-11 ENCOUNTER — Inpatient Hospital Stay: Payer: 59 | Attending: Hematology and Oncology

## 2022-09-11 ENCOUNTER — Other Ambulatory Visit: Payer: Self-pay | Admitting: Hematology and Oncology

## 2022-09-11 ENCOUNTER — Other Ambulatory Visit: Payer: Self-pay

## 2022-09-11 VITALS — BP 148/90 | HR 86 | Temp 98.2°F | Resp 16 | Wt 301.3 lb

## 2022-09-11 DIAGNOSIS — C8335 Diffuse large B-cell lymphoma, lymph nodes of inguinal region and lower limb: Secondary | ICD-10-CM | POA: Diagnosis not present

## 2022-09-11 DIAGNOSIS — Z8572 Personal history of non-Hodgkin lymphomas: Secondary | ICD-10-CM | POA: Insufficient documentation

## 2022-09-11 DIAGNOSIS — Z9221 Personal history of antineoplastic chemotherapy: Secondary | ICD-10-CM | POA: Insufficient documentation

## 2022-09-11 LAB — CBC WITH DIFFERENTIAL (CANCER CENTER ONLY)
Abs Immature Granulocytes: 0.01 10*3/uL (ref 0.00–0.07)
Basophils Absolute: 0 10*3/uL (ref 0.0–0.1)
Basophils Relative: 0 %
Eosinophils Absolute: 0.2 10*3/uL (ref 0.0–0.5)
Eosinophils Relative: 4 %
HCT: 42.3 % (ref 39.0–52.0)
Hemoglobin: 14.7 g/dL (ref 13.0–17.0)
Immature Granulocytes: 0 %
Lymphocytes Relative: 19 %
Lymphs Abs: 1 10*3/uL (ref 0.7–4.0)
MCH: 32.5 pg (ref 26.0–34.0)
MCHC: 34.8 g/dL (ref 30.0–36.0)
MCV: 93.6 fL (ref 80.0–100.0)
Monocytes Absolute: 0.4 10*3/uL (ref 0.1–1.0)
Monocytes Relative: 8 %
Neutro Abs: 3.4 10*3/uL (ref 1.7–7.7)
Neutrophils Relative %: 69 %
Platelet Count: 199 10*3/uL (ref 150–400)
RBC: 4.52 MIL/uL (ref 4.22–5.81)
RDW: 13.3 % (ref 11.5–15.5)
WBC Count: 5 10*3/uL (ref 4.0–10.5)
nRBC: 0 % (ref 0.0–0.2)

## 2022-09-11 LAB — CMP (CANCER CENTER ONLY)
ALT: 18 U/L (ref 0–44)
AST: 17 U/L (ref 15–41)
Albumin: 4 g/dL (ref 3.5–5.0)
Alkaline Phosphatase: 85 U/L (ref 38–126)
Anion gap: 6 (ref 5–15)
BUN: 13 mg/dL (ref 6–20)
CO2: 28 mmol/L (ref 22–32)
Calcium: 9.5 mg/dL (ref 8.9–10.3)
Chloride: 106 mmol/L (ref 98–111)
Creatinine: 1.1 mg/dL (ref 0.61–1.24)
GFR, Estimated: >60 mL/min (ref >60)
Glucose, Bld: 114 mg/dL — ABNORMAL HIGH (ref 70–99)
Potassium: 4 mmol/L (ref 3.5–5.1)
Sodium: 140 mmol/L (ref 135–145)
Total Bilirubin: 0.6 mg/dL (ref 0.3–1.2)
Total Protein: 6.2 g/dL — ABNORMAL LOW (ref 6.5–8.1)

## 2022-09-11 LAB — LACTATE DEHYDROGENASE: LDH: 155 U/L (ref 98–192)

## 2022-09-11 NOTE — Progress Notes (Signed)
[place] [phone]   Fax:([phone]  PROGRESS NOTE  Patient Care Team: Redmond School, MD as PCP - General (Internal Medicine)  Hematological/Oncological History # Diffuse Large B Cell Lymphoma, Stage IV 12/17/2020: CT scan at [facility], results show left inguinal adenopathy and multiple solid splenic lesions probably of lymphoid origin  12/26/2020: excisional biopsy of left inguinal lymph node shows DLBCL.  01/04/2021: establish care with Dr. Lorenso Courier  01/09/2021: PET CT scan shows multiple hypermetabolic splenic lesions compatible with lymphoma.  There are also 2 hypermetabolic liver lesions also likely related to lymphoma.  No evidence of hypermetabolic activity in the neck, chest, abdomen or pelvis elsewhere. 01/21/2021: Cycle 1 Day 1 of R-CHOP chemotherapy 02/11/2021: Cycle 2 Day 1 of R-CHOP chemotherapy 03/04/2021: Cycle 3 Day 1 of R-CHOP chemotherapy 03/20/2021: PET CT scan shows excellent response, with resolution of splenic/liver lesions and only Deauville 1 and 2 lesions remaining elsewhere.  03/25/2021: Cycle 4 Day 1 of R-CHOP chemotherapy 04/17/2021: Cycle 5 Day 1 of R-CHOP chemotherapy 05/06/2021: Cycle 6 Day 1 of R-CHOP chemotherapy 05/29/2021: post treatment PET CT scan showed Deauville Score 1 of prior lymph nodes. Consistent with complete metabolic response. Enter surviellance.  11/29/2021: CT C/A/P showed no evidence of lymphadenopathy in the chest, abdomen, or pelvis. Evidence of prior left inguinal lymph node resection. 05/28/2022: CT C/A/P showed no evidence of recurrent disease in the chest, abdomen, or pelvis  Interval History:  Steve Smith 61 y.o. male with medical history significant for Stage IV DLBCL who presents for a follow up visit. The patient's last visit was on 06/05/2022. In the interim since the last visit Steve Smith has had no major changes in his health.   On exam today Steve Smith presents for a follow-up visit.  He reports that his  wife is currently being followed by Dr. Alvy Bimler for GYN malignancy.  He notes that she is doing quite well.  He reports that he has been well overall.  His energy is quite strong and he has been working 10 hours/day.  He is eating well and denies any bumps or lumps.  He reports no signs or symptoms concerning for enlarged spleen such as early satiety or abdominal distention.  He checks for enlarged lymph nodes every 2 weeks.  Otherwise he is grateful to be in remission so that he is able to help take care of his wife.Marland Kitchen  He denies any fevers, chills, sweats, nausea, vomiting or diarrhea.  A full 10 point ROS is listed below.  On a personal note the patient's wife has developed Fallopian tube cancer and is under treatment with Dr. Alvy Bimler.   MEDICAL HISTORY:  Past Medical History:  Diagnosis Date   Basal cell carcinoma    Hypertension    Kidney stone     SURGICAL HISTORY: Past Surgical History:  Procedure Laterality Date   INGUINAL LYMPH NODE BIOPSY Left 12/26/2020   Procedure: LEFT INGUINAL EXCISIONAL  LYMPH NODE BIOPSY;  Surgeon: Virl Cagey, MD;  Location: [facility];  Service: General;  Laterality: Left;   IR IMAGING GUIDED PORT INSERTION  01/17/2021   IR REMOVAL TUN ACCESS W/ PORT W/O FL MOD SED  06/24/2021   ROTATOR CUFF REPAIR     Left    SOCIAL HISTORY: Social History   Socioeconomic History   Marital status: Married    Spouse name: Not on file   Number of children: Not on file   Years of education: Not on file   Highest  education level: Not on file  Occupational History   Not on file  Tobacco Use   Smoking status: Never   Smokeless tobacco: Never  Substance and Sexual Activity   Alcohol use: No   Drug use: No   Sexual activity: Not on file  Other Topics Concern   Not on file  Social History Narrative   Not on file   Social Determinants of Health   Financial Resource Strain: Not on file  Food Insecurity: Not on file  Transportation Needs: Not on file  Physical  Activity: Not on file  Stress: Not on file  Social Connections: Not on file  Intimate Partner Violence: Not on file    FAMILY HISTORY: Family History  Problem Relation Age of Onset   Basal cell carcinoma Mother    Liver cancer Father     ALLERGIES:  is allergic to ivp dye [iodinated contrast media].  MEDICATIONS:  Current Outpatient Medications  Medication Sig Dispense Refill   olmesartan (BENICAR) 40 MG tablet Take 40 mg by mouth daily.     No current facility-administered medications for this visit.    REVIEW OF SYSTEMS:   Constitutional: ( - ) fevers, ( - )  chills , ( - ) night sweats Eyes: ( - ) blurriness of vision, ( - ) double vision, ( - ) watery eyes Ears, nose, mouth, throat, and face: ( - ) mucositis, ( - ) sore throat Respiratory: ( - ) cough, ( - ) dyspnea, ( - ) wheezes Cardiovascular: ( - ) palpitation, ( - ) chest discomfort, ( - ) lower extremity swelling Gastrointestinal:  ( - ) nausea, ( - ) heartburn, ( - ) change in bowel habits Skin: ( - ) abnormal skin rashes Lymphatics: ( - ) new lymphadenopathy, ( - ) easy bruising Neurological: ( - ) numbness, ( - ) tingling, ( - ) new weaknesses Behavioral/Psych: ( - ) mood change, ( - ) new changes  All other systems were reviewed with the patient and are negative.  PHYSICAL EXAMINATION: ECOG PERFORMANCE STATUS: 1 - Symptomatic but completely ambulatory  Vitals:   09/11/22 1110  BP: (!) 148/90  Pulse: 86  Resp: 16  Temp: 98.2 F (36.8 C)     Filed Weights   09/11/22 1110  Weight: (!) 301 lb 4.8 oz (136.7 kg)    GENERAL: well appearing middle aged Caucasian male. alert, no distress and comfortable SKIN: skin color, texture, turgor are normal, no rashes or significant lesions EYES: conjunctiva are pink and non-injected, sclera clear LUNGS: clear to auscultation and percussion with normal breathing effort HEART: regular rate & rhythm and no murmurs and no lower extremity edema Musculoskeletal: no  cyanosis of digits and no clubbing  PSYCH: alert & oriented x 3, fluent speech NEURO: no focal motor/sensory deficits  LABORATORY DATA:  I have reviewed the data as listed    Latest Ref Rng & Units 09/11/2022   10:19 AM 06/05/2022    2:49 PM 03/06/2022    2:23 PM  CBC  WBC 4.0 - 10.5 K/uL 5.0  5.0  4.7   Hemoglobin 13.0 - 17.0 g/dL 14.7  14.5  14.2   Hematocrit 39.0 - 52.0 % 42.3  41.3  41.6   Platelets 150 - 400 K/uL 199  207  196        Latest Ref Rng & Units 09/11/2022   10:19 AM 06/05/2022    2:49 PM 03/06/2022    2:23 PM  CMP  Glucose  70 - 99 mg/dL 114  93  94   BUN 6 - 20 mg/dL '13  18  17   '$ Creatinine 0.61 - 1.24 mg/dL 1.10  1.17  1.12   Sodium 135 - 145 mmol/L 140  139  140   Potassium 3.5 - 5.1 mmol/L 4.0  3.8  3.8   Chloride 98 - 111 mmol/L 106  107  107   CO2 22 - 32 mmol/L '28  25  25   '$ Calcium 8.9 - 10.3 mg/dL 9.5  9.6  9.5   Total Protein 6.5 - 8.1 g/dL 6.2  6.8  6.7   Total Bilirubin 0.3 - 1.2 mg/dL 0.6  0.7  0.5   Alkaline Phos 38 - 126 U/L 85  80  85   AST 15 - 41 U/L '17  16  17   '$ ALT 0 - 44 U/L '18  16  16     '$ RADIOGRAPHIC STUDIES: No results found.  ASSESSMENT & PLAN Steve Smith 61 y.o. male with medical history significant for Stage IV DLBCL who presents for a follow up visit.  After review the labs, the records, schedule the patient the findings most consistent with a diffuse large B-cell lymphoma with staging in process.  We completed the staging with a PET CT scan which showed involvement of the liver and spleen.  At this time his disease does appear as late stage and we will plan for at least 6 cycles of R-CHOP followed by repeat scans.   Previously we discussed the risks and benefits of R-CHOP chemotherapy.  We noted the nature of diffuse large B-cell lymphoma and the possibility of achieving a complete remission with this chemotherapy regimen.  I also noted the side effects which include but are not limited to nausea, vomiting, hair loss, diarrhea,  sedation, insomnia, hyperglycemia, high blood pressure, and cardiotoxicity.  The patient voices understanding of the risk and benefits and was agreeable to proceeding with treatment at this time.   The R-CHOP regimen consisted of rituximab 375 mg per metered squared IV on day 1, cyclophosphamide 750 mg per metered squared on day 1, doxorubicin 50 mg/m on day 1, vincristine 1.4 mg per metered squared with a max dose of 2 mg on day 1, and prednisone 100 mg orally days 1 through 5.  A cycle lasts for 21 days and number of cycles planned is 6.   IPI Score: 3 points. (53% progression free survival).   #Diffuse Large B Cell Lymphoma, Stage IV --Findings at this time are most consistent with a Stage IV diffuse large B-cell lymphoma  He is Stage IV, required 6 cycles of therapy.  --Baseline labs to include CBC, CMP, uric acid and LDH. --patient completed 6 cycles of R-CHOP with complete metabolic response noted on post treatment PET CT scan from 05/29/2021.  Plan: --per NCCN guidelines, plan for clinic visits q3-6 months for the first 2 years, with q 6 month CT scans.  --CT scan on 05/28/2022 show no evidence of residual or recurrent disease. Next scan in early March 2024 --Labs today show white blood cell count 5.0, hemoglobin 14.7, platelets of 199. --Have the patient return to clinic in 3 months time. Plan for repeat CT scan in March 2024  # Contrast Allergy -- Developed welts and hives after his last CT scan on 05/28/2022. -- Patient will require premedication prior to CT scans moving forward.   # Rash, resolved  #Fever, resolved  -- lesions on resolving --No other focal  symptoms.  Patient is not neutropenic and therefore there is no indication for admission and IV antibiotics. --Strict return precautions for worsening fever or new focal symptoms.   No orders of the defined types were placed in this encounter.   All questions were answered. The patient knows to call the clinic with any  problems, questions or concerns.  A total of more than 25 minutes were spent on this encounter and over half of that time was spent on counseling and coordination of care as outlined above.   Ledell Peoples, MD Department of Hematology/Oncology [place] at [facility] Phone: [phone] Pager: [phone] Email: [email or web address]  09/11/2022 11:42 AM

## 2022-09-12 IMAGING — CT CT CHEST-ABD-PELV W/ CM
2 of 5 series · 13 of 36 positions shown, 15 images · IV contrast (APPLIED)
Comparison: PET-CT, 05/29/2021

CLINICAL DATA: Diffuse large B-cell lymphoma, left pelvic lymph
nodes, chemotherapy complete

EXAM:
CT CHEST, ABDOMEN, AND PELVIS WITH CONTRAST
TECHNIQUE: Multidetector CT imaging of the chest, abdomen and pelvis was
performed following the standard protocol during bolus
administration of intravenous contrast.

[Series 2: cap with · axial · 0.98mm/px · z∈[-705,-100]mm · 10 of 149 slices shown, 12 images]
[im 14/149  mediastinal]
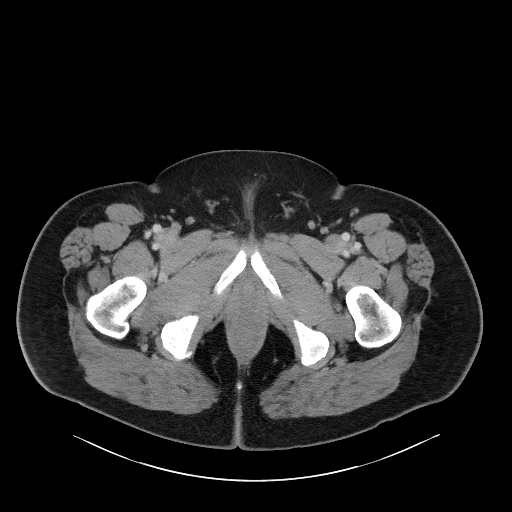
[im 14/149  bone]
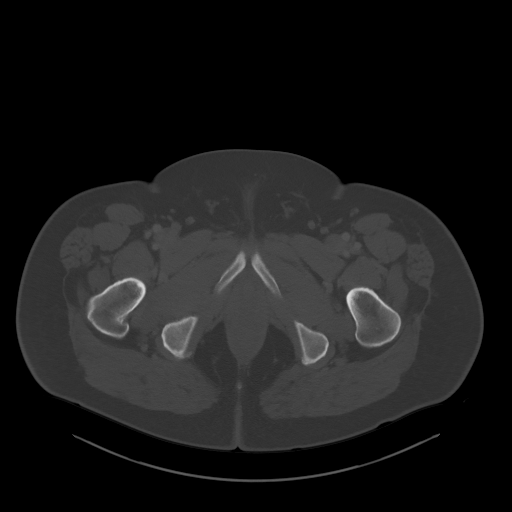
[im 27/149  mediastinal]
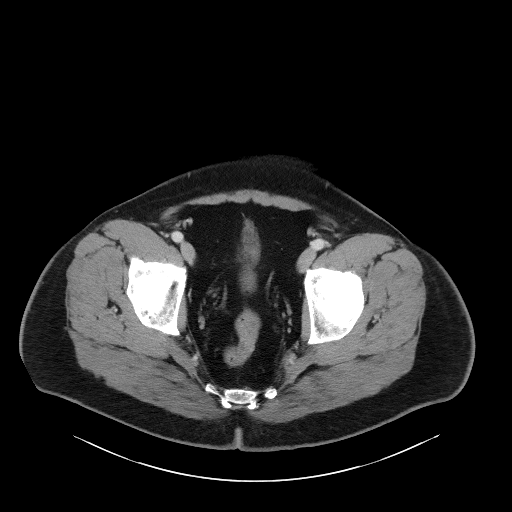
[im 41/149  mediastinal]
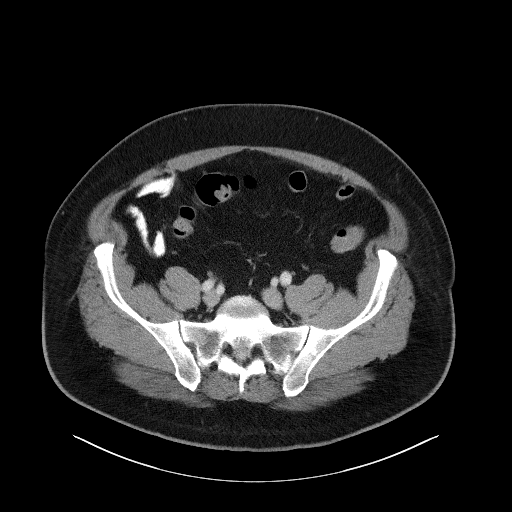
[im 54/149  mediastinal]
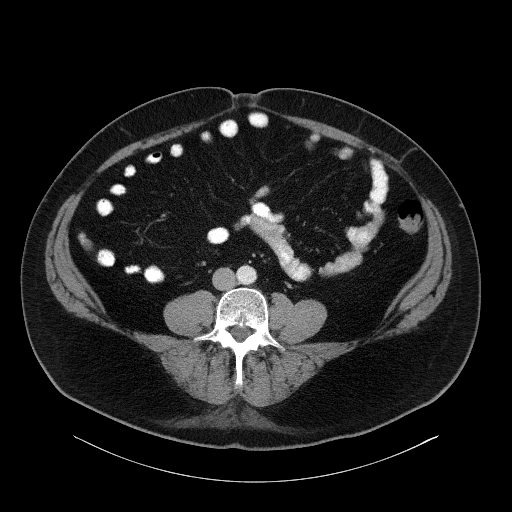
[im 68/149  mediastinal]
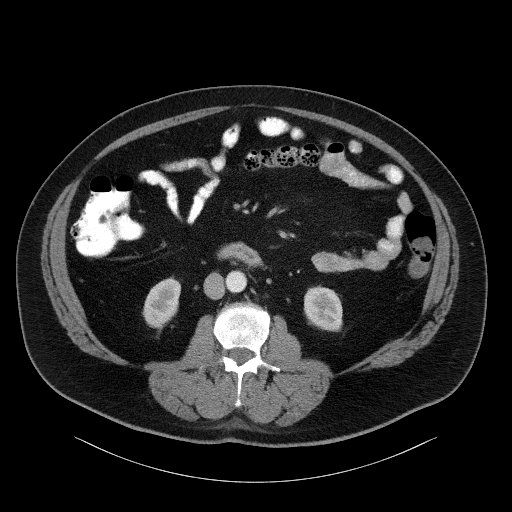
[im 81/149  mediastinal]
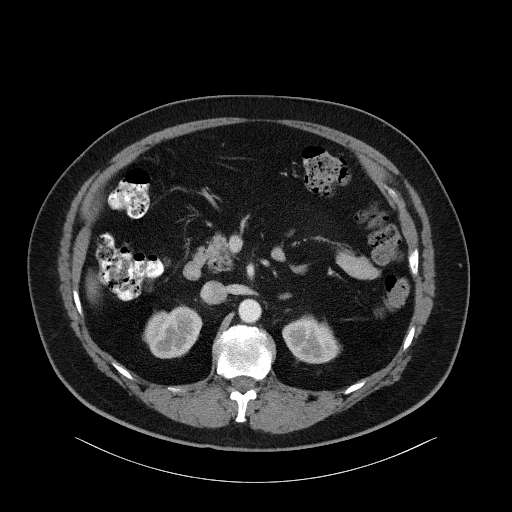
[im 95/149  mediastinal]
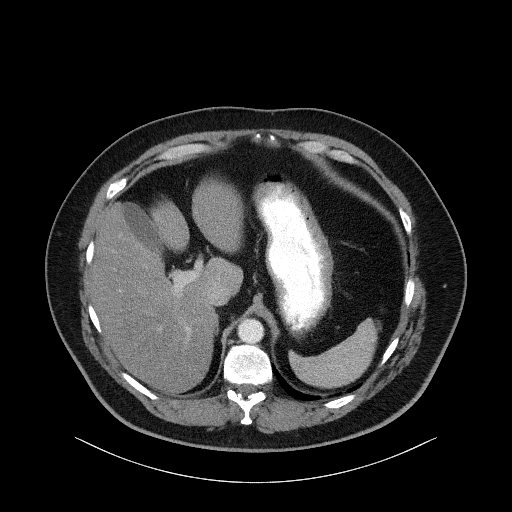
[im 108/149  mediastinal]
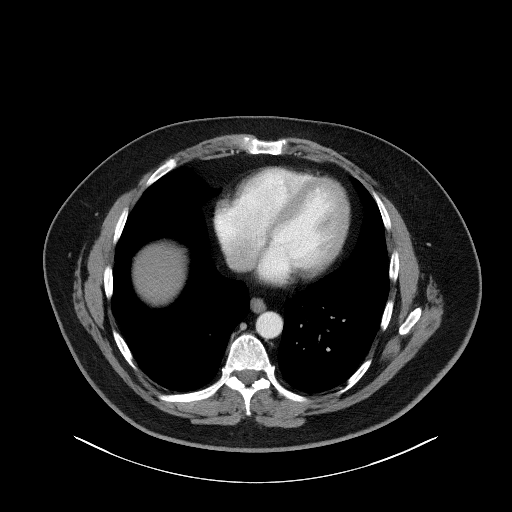
[im 122/149  mediastinal]
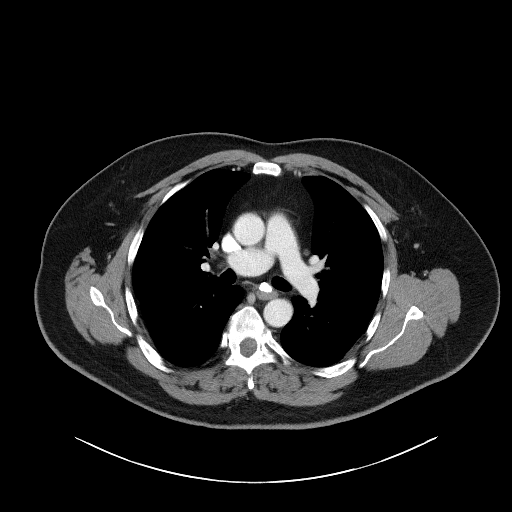
[im 122/149  bone]
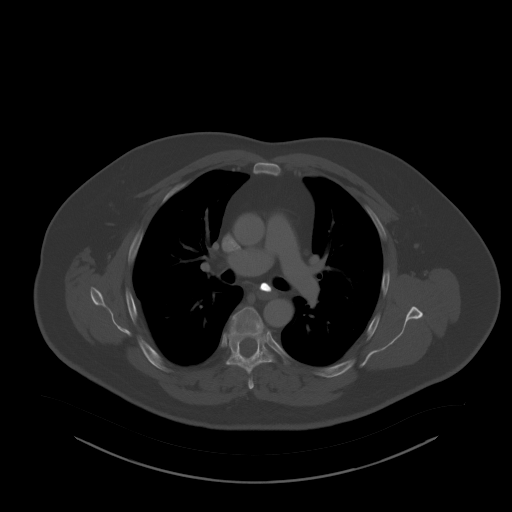
[im 135/149  mediastinal]
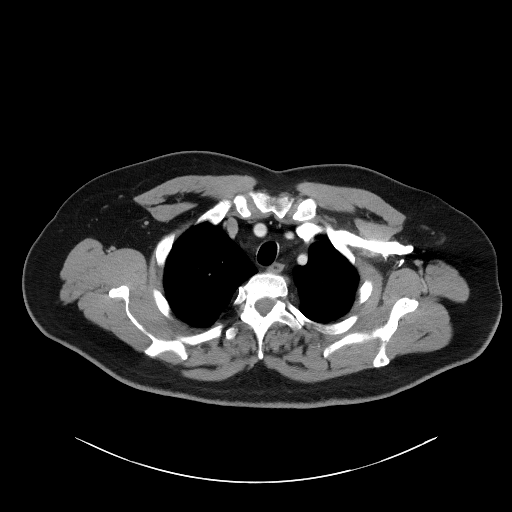

[Series 5: coronals · coronal · 1.00mm/px · 3 of 214 slices shown]
[im 43/214  mediastinal]
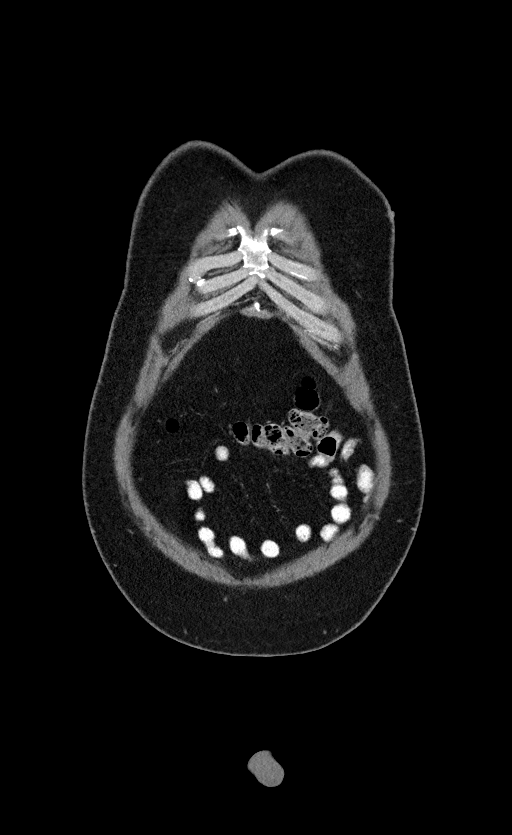
[im 86/214  mediastinal]
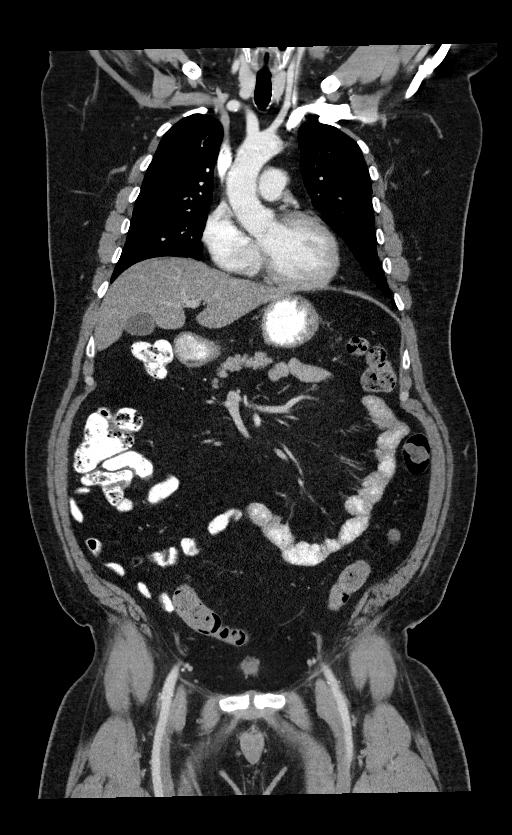
[im 128/214  mediastinal]
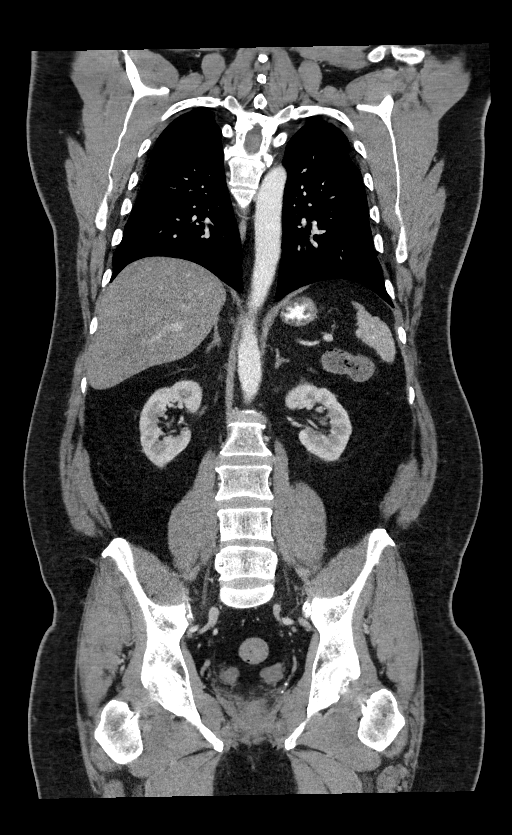

[13 of 36 positions shown; findings below may reference images not displayed]

RADIATION DOSE REDUCTION: This exam was performed according to the
departmental dose-optimization program which includes automated
exposure control, adjustment of the mA and/or kV according to
patient size and/or use of iterative reconstruction technique.

CONTRAST:  100mL OMNIPAQUE IOHEXOL 300 MG/ML  SOLN
FINDINGS: CT CHEST FINDINGS

Cardiovascular: No significant vascular findings. Normal heart size.
No pericardial effusion.

Mediastinum/Nodes: No enlarged mediastinal, hilar, or axillary lymph
nodes. Calcified subcarinal and left hilar lymph nodes. Thyroid
gland, trachea, and esophagus demonstrate no significant findings.

Lungs/Pleura: There is a 0.3 cm nodule of the peripheral right upper
lobe, not clearly visualized on prior free breathing PET-CT
examinations (series 4, image 49). Small, benign calcified nodules
of the left lower lobe, consistent with prior granulomatous
infection. No pleural effusion or pneumothorax.

Musculoskeletal: No chest wall mass or suspicious osseous lesions
identified.

CT ABDOMEN PELVIS FINDINGS

Hepatobiliary: No solid liver abnormality is seen. Hepatic
steatosis. No gallstones, gallbladder wall thickening, or biliary
dilatation.

Pancreas: Unremarkable. No pancreatic ductal dilatation or
surrounding inflammatory changes.

Spleen: Normal in size. Punctuate splenic parenchymal calcifications
consistent with prior granulomatous infection.

Adrenals/Urinary Tract: Adrenal glands are unremarkable. Small
nonobstructive left renal calculus. No right-sided calculi, ureteral
calculi or hydronephrosis. Bladder is unremarkable.

Stomach/Bowel: Stomach is within normal limits. Appendix appears
normal. No evidence of bowel wall thickening, distention, or
inflammatory changes.

Vascular/Lymphatic: Aortic atherosclerosis. No enlarged abdominal or
pelvic lymph nodes. Postoperative findings in the left groin.

Reproductive: Mild prostatomegaly.

Other: No abdominal wall hernia or abnormality. No ascites.

Musculoskeletal: No acute osseous findings.
IMPRESSION: 1. No evidence of lymphadenopathy in the chest, abdomen, or pelvis.
Evidence of prior left inguinal lymph node resection.
2. There is a 0.3 cm nodule of the peripheral right upper lobe, not
clearly visualized on prior free breathing PET-CT examinations,
although likely incidental and benign sequelae of prior infection or
inflammation. Attention on follow-up.
3. Hepatic steatosis.
4. Nonobstructive left nephrolithiasis.
5. Mild prostatomegaly.

Aortic Atherosclerosis (NUVAN-B37.7).

## 2022-09-14 ENCOUNTER — Encounter: Payer: Self-pay | Admitting: Hematology and Oncology

## 2022-11-11 ENCOUNTER — Encounter: Payer: Self-pay | Admitting: Hematology and Oncology

## 2022-11-17 ENCOUNTER — Other Ambulatory Visit: Payer: Self-pay | Admitting: Hematology and Oncology

## 2022-11-17 MED ORDER — PREDNISONE 50 MG PO TABS: ORAL_TABLET | ORAL | 0 refills | Status: DC

## 2022-11-17 MED ORDER — DIPHENHYDRAMINE HCL 50 MG PO TABS: 50.0000 mg | ORAL_TABLET | Freq: Every evening | ORAL | 0 refills | Status: AC | PRN

## 2022-11-21 ENCOUNTER — Ambulatory Visit (HOSPITAL_COMMUNITY)
Admission: RE | Admit: 2022-11-21 | Discharge: 2022-11-21 | Disposition: A | Discharge status: Home / Self Care | Payer: 59 | Source: Ambulatory Visit | Attending: Hematology and Oncology | Admitting: Hematology and Oncology

## 2022-11-21 DIAGNOSIS — C8335 Diffuse large B-cell lymphoma, lymph nodes of inguinal region and lower limb: Secondary | ICD-10-CM | POA: Diagnosis not present

## 2022-11-21 MED ORDER — SODIUM CHLORIDE (PF) 0.9 % IJ SOLN
INTRAMUSCULAR | Status: AC
  Filled 2022-11-21: qty 50

## 2022-11-21 MED ORDER — IOHEXOL 300 MG/ML  SOLN
100.0000 mL | Freq: Once | INTRAMUSCULAR | Status: AC | PRN
  Administered 2022-11-21: 100 mL via INTRAVENOUS

## 2022-11-24 ENCOUNTER — Telehealth: Payer: Self-pay | Admitting: *Deleted

## 2022-11-24 NOTE — Telephone Encounter (Signed)
Notified of message below

## 2022-11-24 NOTE — Telephone Encounter (Signed)
-----   Message from Orson Slick, MD sent at 11/24/2022 10:03 AM EST ----- Please let Steve Smith know that his CT scan did not show any signs of residual or recurrent lymphoma.  He remains in remission.  We will see him as scheduled next month. ----- Message ----- From: Interface, Rad Results In Sent: 11/21/2022   1:47 PM EST To: Orson Slick, MD

## 2022-12-18 ENCOUNTER — Inpatient Hospital Stay: Payer: 59 | Attending: Hematology and Oncology

## 2022-12-18 ENCOUNTER — Other Ambulatory Visit: Payer: Self-pay | Admitting: Hematology and Oncology

## 2022-12-18 ENCOUNTER — Inpatient Hospital Stay: Payer: 59 | Admitting: Hematology and Oncology

## 2022-12-18 VITALS — BP 148/102 | HR 93 | Temp 97.9°F | Resp 20 | Wt 296.9 lb

## 2022-12-18 DIAGNOSIS — Z8572 Personal history of non-Hodgkin lymphomas: Secondary | ICD-10-CM | POA: Insufficient documentation

## 2022-12-18 DIAGNOSIS — C8335 Diffuse large B-cell lymphoma, lymph nodes of inguinal region and lower limb: Secondary | ICD-10-CM | POA: Diagnosis not present

## 2022-12-18 DIAGNOSIS — Z9221 Personal history of antineoplastic chemotherapy: Secondary | ICD-10-CM | POA: Diagnosis not present

## 2022-12-18 LAB — CBC WITH DIFFERENTIAL (CANCER CENTER ONLY)
Abs Immature Granulocytes: 0.01 10*3/uL (ref 0.00–0.07)
Basophils Absolute: 0 10*3/uL (ref 0.0–0.1)
Basophils Relative: 0 %
Eosinophils Absolute: 0.2 10*3/uL (ref 0.0–0.5)
Eosinophils Relative: 4 %
HCT: 43.1 % (ref 39.0–52.0)
Hemoglobin: 15 g/dL (ref 13.0–17.0)
Immature Granulocytes: 0 %
Lymphocytes Relative: 24 %
Lymphs Abs: 1.2 10*3/uL (ref 0.7–4.0)
MCH: 32.1 pg (ref 26.0–34.0)
MCHC: 34.8 g/dL (ref 30.0–36.0)
MCV: 92.3 fL (ref 80.0–100.0)
Monocytes Absolute: 0.4 10*3/uL (ref 0.1–1.0)
Monocytes Relative: 8 %
Neutro Abs: 3.2 10*3/uL (ref 1.7–7.7)
Neutrophils Relative %: 64 %
Platelet Count: 212 10*3/uL (ref 150–400)
RBC: 4.67 MIL/uL (ref 4.22–5.81)
RDW: 13 % (ref 11.5–15.5)
WBC Count: 5 10*3/uL (ref 4.0–10.5)
nRBC: 0 % (ref 0.0–0.2)

## 2022-12-18 LAB — CMP (CANCER CENTER ONLY)
ALT: 15 U/L (ref 0–44)
AST: 14 U/L — ABNORMAL LOW (ref 15–41)
Albumin: 4 g/dL (ref 3.5–5.0)
Alkaline Phosphatase: 87 U/L (ref 38–126)
Anion gap: 8 (ref 5–15)
BUN: 14 mg/dL (ref 6–20)
CO2: 26 mmol/L (ref 22–32)
Calcium: 9.1 mg/dL (ref 8.9–10.3)
Chloride: 106 mmol/L (ref 98–111)
Creatinine: 1.2 mg/dL (ref 0.61–1.24)
GFR, Estimated: >60 mL/min (ref >60)
Glucose, Bld: 100 mg/dL — ABNORMAL HIGH (ref 70–99)
Potassium: 3.9 mmol/L (ref 3.5–5.1)
Sodium: 140 mmol/L (ref 135–145)
Total Bilirubin: 0.7 mg/dL (ref 0.3–1.2)
Total Protein: 6.3 g/dL — ABNORMAL LOW (ref 6.5–8.1)

## 2022-12-18 LAB — LACTATE DEHYDROGENASE: LDH: 141 U/L (ref 98–192)

## 2022-12-18 NOTE — Progress Notes (Signed)
[facility] [phone]   [phone]  PROGRESS NOTE  Patient Care Team: Redmond [employer], MD as PCP - General (Internal Medicine)  Hematological/Oncological History # Diffuse Large B Cell Lymphoma, Stage IV 12/17/2020: CT scan at [facility], results show left inguinal adenopathy and multiple solid splenic lesions probably of lymphoid origin  12/26/2020: excisional biopsy of left inguinal lymph node shows DLBCL.  01/04/2021: establish care with Dr. Lorenso Courier  01/09/2021: PET CT scan shows multiple hypermetabolic splenic lesions compatible with lymphoma.  There are also 2 hypermetabolic liver lesions also likely related to lymphoma.  No evidence of hypermetabolic activity in the neck, chest, abdomen or pelvis elsewhere. 01/21/2021: Cycle 1 Day 1 of R-CHOP chemotherapy 02/11/2021: Cycle 2 Day 1 of R-CHOP chemotherapy 03/04/2021: Cycle 3 Day 1 of R-CHOP chemotherapy 03/20/2021: PET CT scan shows excellent response, with resolution of splenic/liver lesions and only Deauville 1 and 2 lesions remaining elsewhere.  03/25/2021: Cycle 4 Day 1 of R-CHOP chemotherapy 04/17/2021: Cycle 5 Day 1 of R-CHOP chemotherapy 05/06/2021: Cycle 6 Day 1 of R-CHOP chemotherapy 05/29/2021: post treatment PET CT scan showed Deauville Score 1 of prior lymph nodes. Consistent with complete metabolic response. Enter surviellance.  11/29/2021: CT C/A/P showed no evidence of lymphadenopathy in the chest, abdomen, or pelvis. Evidence of prior left inguinal lymph node resection. 05/28/2022: CT C/A/P showed no evidence of recurrent disease in the chest, abdomen, or pelvis 11/21/2022: CT C/A/P showed no evidence of recurrent disease in the chest, abdomen, or pelvis  Interval History:  Steve Smith 62 y.o. male with medical history significant for Stage IV DLBCL who presents for a follow up visit. The patient's last visit was on 09/11/2022. In the interim since the last visit Mr. Constable has had no major changes in  his health.   On exam today Mr. Seepersad presents for a follow-up visit.  He reports he has been doing great and feeling good.  He reports he is losing weight by trying to eat more fruits and vegetables for lunch and not snacking as much.  He reports that since he finished chemotherapy has been more sensitive to the cold and has not enjoyed hunting as much.  He reports his energy is excellent and he is able to work 10-hour days without difficulty.  He is keeping quite busy.  He notes he is not having any bumps or lumps concerning for lymphadenopathy.  Overall he feels quite well.  He reports his wife has also completed her treatment with Dr. Alvy Bimler..  He denies any fevers, chills, sweats, nausea, vomiting or diarrhea.  A full 10 point ROS is listed below.  On a personal note the patient's wife developed Fallopian tube cancer and is under treatment with Dr. Alvy Bimler. She is currently in remission.   MEDICAL HISTORY:  Past Medical History:  Diagnosis Date   Basal cell carcinoma    Hypertension    Kidney stone     SURGICAL HISTORY: Past Surgical History:  Procedure Laterality Date   INGUINAL LYMPH NODE BIOPSY Left 12/26/2020   Procedure: LEFT INGUINAL EXCISIONAL  LYMPH NODE BIOPSY;  Surgeon: Virl Cagey, MD;  Location: [facility];  Service: General;  Laterality: Left;   IR IMAGING GUIDED PORT INSERTION  01/17/2021   IR REMOVAL TUN ACCESS W/ PORT W/O FL MOD SED  06/24/2021   ROTATOR CUFF REPAIR     Left    SOCIAL HISTORY: Social History   Socioeconomic History   Marital status: Married    Spouse  name: Not on file   Number of children: Not on file   Years of education: Not on file   Highest education level: Not on file  Occupational History   Not on file  Tobacco Use   Smoking status: Never   Smokeless tobacco: Never  Substance and Sexual Activity   Alcohol use: No   Drug use: No   Sexual activity: Not on file  Other Topics Concern   Not on file  Social History Narrative   Not  on file   Social Determinants of Health   Financial Resource Strain: Not on file  Food Insecurity: Not on file  Transportation Needs: Not on file  Physical Activity: Not on file  Stress: Not on file  Social Connections: Not on file  Intimate Partner Violence: Not on file    FAMILY HISTORY: Family History  Problem Relation Age of Onset   Basal cell carcinoma Mother    Liver cancer Father     ALLERGIES:  is allergic to ivp dye [iodinated contrast media].  MEDICATIONS:  Current Outpatient Medications  Medication Sig Dispense Refill   diphenhydrAMINE (BENADRYL) 50 MG tablet Take 1 tablet (50 mg total) by mouth at bedtime as needed for itching. Take 1 hour prior to CT scan. 30 tablet 0   olmesartan (BENICAR) 40 MG tablet Take 40 mg by mouth daily.     predniSONE (DELTASONE) 50 MG tablet Take 1 pill 13 hours prior to CT scan, the next 7 hours prior to CT scan, and the last 1 hour prior to CT scan. 3 tablet 0   No current facility-administered medications for this visit.    REVIEW OF SYSTEMS:   Constitutional: ( - ) fevers, ( - )  chills , ( - ) night sweats Eyes: ( - ) blurriness of vision, ( - ) double vision, ( - ) watery eyes Ears, nose, mouth, throat, and face: ( - ) mucositis, ( - ) sore throat Respiratory: ( - ) cough, ( - ) dyspnea, ( - ) wheezes Cardiovascular: ( - ) palpitation, ( - ) chest discomfort, ( - ) lower extremity swelling Gastrointestinal:  ( - ) nausea, ( - ) heartburn, ( - ) change in bowel habits Skin: ( - ) abnormal skin rashes Lymphatics: ( - ) new lymphadenopathy, ( - ) easy bruising Neurological: ( - ) numbness, ( - ) tingling, ( - ) new weaknesses Behavioral/Psych: ( - ) mood change, ( - ) new changes  All other systems were reviewed with the patient and are negative.  PHYSICAL EXAMINATION: ECOG PERFORMANCE STATUS: 1 - Symptomatic but completely ambulatory  Vitals:   12/18/22 1505  BP: (!) 148/102  Pulse: 93  Resp: 20  Temp: 97.9 F (36.6 C)   SpO2: 98%     Filed Weights   12/18/22 1505  Weight: 296 lb 14.4 oz (134.7 kg)    GENERAL: well appearing middle aged Caucasian male. alert, no distress and comfortable SKIN: skin color, texture, turgor are normal, no rashes or significant lesions EYES: conjunctiva are pink and non-injected, sclera clear LUNGS: clear to auscultation and percussion with normal breathing effort HEART: regular rate & rhythm and no murmurs and no lower extremity edema Musculoskeletal: no cyanosis of digits and no clubbing  PSYCH: alert & oriented x 3, fluent speech NEURO: no focal motor/sensory deficits  LABORATORY DATA:  I have reviewed the data as listed    Latest Ref Rng & Units 12/18/2022    2:54 PM 09/11/2022  10:19 AM 06/05/2022    2:49 PM  CBC  WBC 4.0 - 10.5 K/uL 5.0  5.0  5.0   Hemoglobin 13.0 - 17.0 g/dL 15.0  14.7  14.5   Hematocrit 39.0 - 52.0 % 43.1  42.3  41.3   Platelets 150 - 400 K/uL 212  199  207        Latest Ref Rng & Units 09/11/2022   10:19 AM 06/05/2022    2:49 PM 03/06/2022    2:23 PM  CMP  Glucose 70 - 99 mg/dL 114  93  94   BUN 6 - 20 mg/dL 13  18  17    Creatinine 0.61 - 1.24 mg/dL 1.10  1.17  1.12   Sodium 135 - 145 mmol/L 140  139  140   Potassium 3.5 - 5.1 mmol/L 4.0  3.8  3.8   Chloride 98 - 111 mmol/L 106  107  107   CO2 22 - 32 mmol/L 28  25  25    Calcium 8.9 - 10.3 mg/dL 9.5  9.6  9.5   Total Protein 6.5 - 8.1 g/dL 6.2  6.8  6.7   Total Bilirubin 0.3 - 1.2 mg/dL 0.6  0.7  0.5   Alkaline Phos 38 - 126 U/L 85  80  85   AST 15 - 41 U/L 17  16  17    ALT 0 - 44 U/L 18  16  16      RADIOGRAPHIC STUDIES: CT CHEST ABDOMEN PELVIS W CONTRAST  Result Date: 11/21/2022 CLINICAL DATA:  Lymphoma. In remission on chemotherapy. * Tracking Code: BO * EXAM: CT CHEST, ABDOMEN, AND PELVIS WITH CONTRAST TECHNIQUE: Multidetector CT imaging of the chest, abdomen and pelvis was performed following the standard protocol during bolus administration of intravenous contrast.  RADIATION DOSE REDUCTION: This exam was performed according to the departmental dose-optimization program which includes automated exposure control, adjustment of the mA and/or kV according to patient size and/or use of iterative reconstruction technique. CONTRAST:  174mL OMNIPAQUE IOHEXOL 300 MG/ML SOLN. Of note the patient has a known contrast allergy in the patient did undergo steroid preparation COMPARISON:  CT 05/28/2022 and older FINDINGS: CT CHEST FINDINGS Cardiovascular: Heart is nonenlarged. No pericardial effusion. Normal caliber thoracic aorta. Minimal coronary artery calcifications are seen. Please correlate for other coronary risk factors. Mediastinum/Nodes: No specific abnormal lymph node enlargement seen in the axillary region, hilum or mediastinum. There is a lymph node subcarinal which has some dystrophic calcification, unchanged from prior. Also calcification along left hilar node. Small hiatal hernia. Otherwise normal caliber thoracic esophagus. Preserved thyroid gland. Lungs/Pleura: No consolidation, pneumothorax or effusion. There are some calcified nodules identified bilaterally consistent with old granulomatous disease. There is a new subtle area nodularity with thickening ground-glass along the right lower lobe on series 4, image 97 measuring 7 mm. Nonspecific. Adjacent area of bronchiectasis. Musculoskeletal: Mild degenerative changes noted along the spine. CT ABDOMEN PELVIS FINDINGS Hepatobiliary: Fatty liver infiltration identified. No space-occupying liver lesion. Patent portal vein. Gallbladder is nondilated. Pancreas: Unremarkable. No pancreatic ductal dilatation or surrounding inflammatory changes. Spleen: The spleen is nonenlarged. Maximal dimension 11.3 cm. Multiple splenic granulomas. Adrenals/Urinary Tract: Adrenal glands are preserved. No enhancing renal mass or collecting system dilatation. There are bilateral nonobstructing renal stones. Measuring up to 4 mm on the left and 6 mm  on the right. Mild perinephric stranding nonspecific. Prominent renal sinus fat. The uterus has a normal course and caliber down to the bladder. The bladder is underdistended but has  a preserved contour. Stomach/Bowel: Large bowel is of normal course and caliber. Few left-sided colonic diverticula. Normal retrocecal appendix. The stomach is nondilated. Small bowel is nondilated. Vascular/Lymphatic: Scattered mild vascular calcifications along the aorta and branch vessels. Preserved IVC. Developing abnormal lymph node enlargement seen in the abdomen and pelvis. This includes margin of the stomach, porta hepatic, retrocrural, retroperitoneal, iliac chain, inguinal chain pelvic sidewall. Reproductive: Heterogeneous prostate. Other: No ascites.  Small fat containing umbilical hernia. Musculoskeletal: Mild degenerative changes seen along the spine and pelvis. IMPRESSION: No developing abnormal lymph node enlargement or splenomegaly. Fatty liver infiltration. Evidence of old granulomatous disease. Bilateral nonobstructing renal stones. Small hiatal hernia Electronically Signed   By: Jill Side M.D.   On: 11/21/2022 13:45    ASSESSMENT & PLAN EMELIO YEAKEL 62 y.o. male with medical history significant for Stage IV DLBCL who presents for a follow up visit.  After review the labs, the records, schedule the patient the findings most consistent with a diffuse large B-cell lymphoma with staging in process.  We completed the staging with a PET CT scan which showed involvement of the liver and spleen.  At this time his disease does appear as late stage and we will plan for at least 6 cycles of R-CHOP followed by repeat scans.   Previously we discussed the risks and benefits of R-CHOP chemotherapy.  We noted the nature of diffuse large B-cell lymphoma and the possibility of achieving a complete remission with this chemotherapy regimen.  I also noted the side effects which include but are not limited to nausea, vomiting,  hair loss, diarrhea, sedation, insomnia, hyperglycemia, high blood pressure, and cardiotoxicity.  The patient voices understanding of the risk and benefits and was agreeable to proceeding with treatment at this time.   The R-CHOP regimen consisted of rituximab 375 mg per metered squared IV on day 1, cyclophosphamide 750 mg per metered squared on day 1, doxorubicin 50 mg/m on day 1, vincristine 1.4 mg per metered squared with a max dose of 2 mg on day 1, and prednisone 100 mg orally days 1 through 5.  A cycle lasts for 21 days and number of cycles planned is 6.   IPI Score: 3 points. (53% progression free survival).   #Diffuse Large B Cell Lymphoma, Stage IV --Findings at this time are most consistent with a Stage IV diffuse large B-cell lymphoma  He is Stage IV, required 6 cycles of therapy.  --Baseline labs to include CBC, CMP, uric acid and LDH. --patient completed 6 cycles of R-CHOP with complete metabolic response noted on post treatment PET CT scan from 05/29/2021.  Plan: --per NCCN guidelines, plan for clinic visits q3-6 months for the first 2 years, with q 6 month CT scans.  --CT scan on 05/28/2022 show no evidence of residual or recurrent disease. Next scan in August 2024 --Labs today show white blood cell count 5.0, hemoglobin 15.0, MCV 92.3, and platelets of 212. --Have the patient return to clinic in 3 months time. Plan for repeat CT scan in August 2024  # Contrast Allergy -- Developed welts and hives after his last CT scan on 05/28/2022. -- Patient will require premedication prior to CT scans moving forward.  # Rash, resolved  #Fever, resolved  -- lesions on resolving --No other focal symptoms.  Patient is not neutropenic and therefore there is no indication for admission and IV antibiotics. --Strict return precautions for worsening fever or new focal symptoms.   No orders of the defined types  were placed in this encounter.   All questions were answered. The patient knows to  call the clinic with any problems, questions or concerns.  A total of more than 25 minutes were spent on this encounter and over half of that time was spent on counseling and coordination of care as outlined above.   Ledell Peoples, MD Department of Hematology/Oncology [facility] Phone: [phone] Pager: [phone] Email: [email or web address]  12/18/2022 3:11 PM

## 2022-12-19 ENCOUNTER — Telehealth: Payer: Self-pay | Admitting: Hematology and Oncology

## 2022-12-19 NOTE — Telephone Encounter (Signed)
Called patient per 3/21 los notes to schedule f/u. Left voicemail with new appointment information and contact details if needing to reschedule.

## 2023-03-19 ENCOUNTER — Encounter: Payer: Self-pay | Admitting: Hematology and Oncology

## 2023-03-26 ENCOUNTER — Encounter: Payer: Self-pay | Admitting: Hematology and Oncology

## 2023-03-26 ENCOUNTER — Other Ambulatory Visit: Payer: Self-pay

## 2023-03-26 ENCOUNTER — Inpatient Hospital Stay: Payer: 59 | Attending: Hematology and Oncology

## 2023-03-26 ENCOUNTER — Inpatient Hospital Stay: Payer: 59 | Admitting: Hematology and Oncology

## 2023-03-26 ENCOUNTER — Other Ambulatory Visit: Payer: Self-pay | Admitting: Hematology and Oncology

## 2023-03-26 VITALS — BP 142/91 | HR 83 | Temp 97.8°F | Resp 15 | Wt 293.6 lb

## 2023-03-26 DIAGNOSIS — Z8572 Personal history of non-Hodgkin lymphomas: Secondary | ICD-10-CM | POA: Diagnosis not present

## 2023-03-26 DIAGNOSIS — C8335 Diffuse large B-cell lymphoma, lymph nodes of inguinal region and lower limb: Secondary | ICD-10-CM

## 2023-03-26 DIAGNOSIS — Z95828 Presence of other vascular implants and grafts: Secondary | ICD-10-CM

## 2023-03-26 DIAGNOSIS — Z91041 Radiographic dye allergy status: Secondary | ICD-10-CM | POA: Diagnosis not present

## 2023-03-26 DIAGNOSIS — Z9221 Personal history of antineoplastic chemotherapy: Secondary | ICD-10-CM | POA: Diagnosis not present

## 2023-03-26 LAB — CMP (CANCER CENTER ONLY)
ALT: 13 U/L (ref 0–44)
AST: 14 U/L — ABNORMAL LOW (ref 15–41)
Albumin: 3.8 g/dL (ref 3.5–5.0)
Alkaline Phosphatase: 72 U/L (ref 38–126)
Anion gap: 6 (ref 5–15)
BUN: 18 mg/dL (ref 6–20)
CO2: 26 mmol/L (ref 22–32)
Calcium: 9.4 mg/dL (ref 8.9–10.3)
Chloride: 108 mmol/L (ref 98–111)
Creatinine: 1.08 mg/dL (ref 0.61–1.24)
GFR, Estimated: >60 mL/min (ref >60)
Glucose, Bld: 115 mg/dL — ABNORMAL HIGH (ref 70–99)
Potassium: 3.7 mmol/L (ref 3.5–5.1)
Sodium: 140 mmol/L (ref 135–145)
Total Bilirubin: 0.5 mg/dL (ref 0.3–1.2)
Total Protein: 6.6 g/dL (ref 6.5–8.1)

## 2023-03-26 LAB — CBC WITH DIFFERENTIAL (CANCER CENTER ONLY)
Abs Immature Granulocytes: 0.01 10*3/uL (ref 0.00–0.07)
Basophils Absolute: 0 10*3/uL (ref 0.0–0.1)
Basophils Relative: 1 %
Eosinophils Absolute: 0.2 10*3/uL (ref 0.0–0.5)
Eosinophils Relative: 4 %
HCT: 43.2 % (ref 39.0–52.0)
Hemoglobin: 14.5 g/dL (ref 13.0–17.0)
Immature Granulocytes: 0 %
Lymphocytes Relative: 24 %
Lymphs Abs: 1.2 10*3/uL (ref 0.7–4.0)
MCH: 31.7 pg (ref 26.0–34.0)
MCHC: 33.6 g/dL (ref 30.0–36.0)
MCV: 94.3 fL (ref 80.0–100.0)
Monocytes Absolute: 0.5 10*3/uL (ref 0.1–1.0)
Monocytes Relative: 10 %
Neutro Abs: 3.1 10*3/uL (ref 1.7–7.7)
Neutrophils Relative %: 61 %
Platelet Count: 207 10*3/uL (ref 150–400)
RBC: 4.58 MIL/uL (ref 4.22–5.81)
RDW: 13.9 % (ref 11.5–15.5)
WBC Count: 5 10*3/uL (ref 4.0–10.5)
nRBC: 0 % (ref 0.0–0.2)

## 2023-03-26 LAB — LACTATE DEHYDROGENASE: LDH: 155 U/L (ref 98–192)

## 2023-03-26 NOTE — Progress Notes (Signed)
[facility] Telephone:([phone]   [phone]  PROGRESS NOTE  Patient Care Team: Elfredia Nevins, MD as PCP - General (Internal Medicine)  Hematological/Oncological History # Diffuse Large B Cell Lymphoma, Stage IV 12/17/2020: CT scan at [facility], results show left inguinal adenopathy and multiple solid splenic lesions probably of lymphoid origin  12/26/2020: excisional biopsy of left inguinal lymph node shows DLBCL.  01/04/2021: establish care with Dr. Leonides Schanz  01/09/2021: PET CT scan shows multiple hypermetabolic splenic lesions compatible with lymphoma.  There are also 2 hypermetabolic liver lesions also likely related to lymphoma.  No evidence of hypermetabolic activity in the neck, chest, abdomen or pelvis elsewhere. 01/21/2021: Cycle 1 Day 1 of R-CHOP chemotherapy 02/11/2021: Cycle 2 Day 1 of R-CHOP chemotherapy 03/04/2021: Cycle 3 Day 1 of R-CHOP chemotherapy 03/20/2021: PET CT scan shows excellent response, with resolution of splenic/liver lesions and only Deauville 1 and 2 lesions remaining elsewhere.  03/25/2021: Cycle 4 Day 1 of R-CHOP chemotherapy 04/17/2021: Cycle 5 Day 1 of R-CHOP chemotherapy 05/06/2021: Cycle 6 Day 1 of R-CHOP chemotherapy 05/29/2021: post treatment PET CT scan showed Deauville Score 1 of prior lymph nodes. Consistent with complete metabolic response. Enter surviellance.  11/29/2021: CT C/A/P showed no evidence of lymphadenopathy in the chest, abdomen, or pelvis. Evidence of prior left inguinal lymph node resection. 05/28/2022: CT C/A/P showed no evidence of recurrent disease in the chest, abdomen, or pelvis 11/21/2022: CT C/A/P showed no evidence of recurrent disease in the chest, abdomen, or pelvis  Interval History:  Steve Smith 62 y.o. male with medical history significant for Stage IV DLBCL who presents for a follow up visit. The patient's last visit was on 12/18/2022. In the interim since the last visit Steve Smith has had no major changes in  his health.   On exam today Steve Smith reports that he has been "doing great".  He is trying to stay cool and hydrating plenty.  He has had no major changes in his health in the interim since her last visit.  His energy levels are strong and his appetite is good.  He reports about 1 month ago he did have a bad episode of COVID, bronchitis, sinusitis.  He was on Paxlovid antibiotic therapy, but is now "back to 100%".  He notes that he has been intentionally losing weight and is down about 8 pounds since December 2023.  Overall he feels well and has no questions concerns or complaints today.  He denies any fevers, chills, sweats, nausea, vomiting or diarrhea.  A full 10 point ROS is listed below.  On a personal note the patient's wife developed fallopian tube cancer and is under the care of  Dr. Bertis Ruddy. She is currently in remission.   MEDICAL HISTORY:  Past Medical History:  Diagnosis Date   Basal cell carcinoma    Hypertension    Kidney stone     SURGICAL HISTORY: Past Surgical History:  Procedure Laterality Date   INGUINAL LYMPH NODE BIOPSY Left 12/26/2020   Procedure: LEFT INGUINAL EXCISIONAL  LYMPH NODE BIOPSY;  Surgeon: Lucretia Roers, MD;  Location: [facility];  Service: General;  Laterality: Left;   IR IMAGING GUIDED PORT INSERTION  01/17/2021   IR REMOVAL TUN ACCESS W/ PORT W/O FL MOD SED  06/24/2021   ROTATOR CUFF REPAIR     Left    SOCIAL HISTORY: Social History   Socioeconomic History   Marital status: Married    Spouse name: Not on file   Number  of children: Not on file   Years of education: Not on file   Highest education level: Not on file  Occupational History   Not on file  Tobacco Use   Smoking status: Never   Smokeless tobacco: Never  Substance and Sexual Activity   Alcohol use: No   Drug use: No   Sexual activity: Not on file  Other Topics Concern   Not on file  Social History Narrative   Not on file   Social Determinants of Health   Financial  Resource Strain: Not on file  Food Insecurity: Not on file  Transportation Needs: Not on file  Physical Activity: Not on file  Stress: Not on file  Social Connections: Not on file  Intimate Partner Violence: Not on file    FAMILY HISTORY: Family History  Problem Relation Age of Onset   Basal cell carcinoma Mother    Liver cancer Father     ALLERGIES:  is allergic to ivp dye [iodinated contrast media].  MEDICATIONS:  Current Outpatient Medications  Medication Sig Dispense Refill   diphenhydrAMINE (BENADRYL) 50 MG tablet Take 1 tablet (50 mg total) by mouth at bedtime as needed for itching. Take 1 hour prior to CT scan. 30 tablet 0   olmesartan (BENICAR) 40 MG tablet Take 40 mg by mouth daily.     predniSONE (DELTASONE) 50 MG tablet Take 1 pill 13 hours prior to CT scan, the next 7 hours prior to CT scan, and the last 1 hour prior to CT scan. 3 tablet 0   No current facility-administered medications for this visit.    REVIEW OF SYSTEMS:   Constitutional: ( - ) fevers, ( - )  chills , ( - ) night sweats Eyes: ( - ) blurriness of vision, ( - ) double vision, ( - ) watery eyes Ears, nose, mouth, throat, and face: ( - ) mucositis, ( - ) sore throat Respiratory: ( - ) cough, ( - ) dyspnea, ( - ) wheezes Cardiovascular: ( - ) palpitation, ( - ) chest discomfort, ( - ) lower extremity swelling Gastrointestinal:  ( - ) nausea, ( - ) heartburn, ( - ) change in bowel habits Skin: ( - ) abnormal skin rashes Lymphatics: ( - ) new lymphadenopathy, ( - ) easy bruising Neurological: ( - ) numbness, ( - ) tingling, ( - ) new weaknesses Behavioral/Psych: ( - ) mood change, ( - ) new changes  All other systems were reviewed with the patient and are negative.  PHYSICAL EXAMINATION: ECOG PERFORMANCE STATUS: 1 - Symptomatic but completely ambulatory  Vitals:   03/26/23 1459  BP: (!) 142/91  Pulse: 83  Resp: 15  Temp: 97.8 F (36.6 C)  SpO2: 97%     Filed Weights   03/26/23 1459   Weight: 293 lb 9.6 oz (133.2 kg)    GENERAL: well appearing middle aged Caucasian male. alert, no distress and comfortable SKIN: skin color, texture, turgor are normal, no rashes or significant lesions EYES: conjunctiva are pink and non-injected, sclera clear LUNGS: clear to auscultation and percussion with normal breathing effort HEART: regular rate & rhythm and no murmurs and no lower extremity edema Musculoskeletal: no cyanosis of digits and no clubbing  PSYCH: alert & oriented x 3, fluent speech NEURO: no focal motor/sensory deficits  LABORATORY DATA:  I have reviewed the data as listed    Latest Ref Rng & Units 03/26/2023    2:33 PM 12/18/2022    2:54 PM 09/11/2022  10:19 AM  CBC  WBC 4.0 - 10.5 K/uL 5.0  5.0  5.0   Hemoglobin 13.0 - 17.0 g/dL 40.9  81.1  91.4   Hematocrit 39.0 - 52.0 % 43.2  43.1  42.3   Platelets 150 - 400 K/uL 207  212  199        Latest Ref Rng & Units 03/26/2023    2:33 PM 12/18/2022    2:54 PM 09/11/2022   10:19 AM  CMP  Glucose 70 - 99 mg/dL 782  956  213   BUN 6 - 20 mg/dL 18  14  13    Creatinine 0.61 - 1.24 mg/dL 0.86  5.78  4.69   Sodium 135 - 145 mmol/L 140  140  140   Potassium 3.5 - 5.1 mmol/L 3.7  3.9  4.0   Chloride 98 - 111 mmol/L 108  106  106   CO2 22 - 32 mmol/L 26  26  28    Calcium 8.9 - 10.3 mg/dL 9.4  9.1  9.5   Total Protein 6.5 - 8.1 g/dL 6.6  6.3  6.2   Total Bilirubin 0.3 - 1.2 mg/dL 0.5  0.7  0.6   Alkaline Phos 38 - 126 U/L 72  87  85   AST 15 - 41 U/L 14  14  17    ALT 0 - 44 U/L 13  15  18      RADIOGRAPHIC STUDIES: No results found.  ASSESSMENT & PLAN Steve Smith 62 y.o. male with medical history significant for Stage IV DLBCL who presents for a follow up visit.  After review the labs, the records, schedule the patient the findings most consistent with a diffuse large B-cell lymphoma with staging in process.  We completed the staging with a PET CT scan which showed involvement of the liver and spleen.  At this  time his disease does appear as late stage and we will plan for at least 6 cycles of R-CHOP followed by repeat scans.   Previously we discussed the risks and benefits of R-CHOP chemotherapy.  We noted the nature of diffuse large B-cell lymphoma and the possibility of achieving a complete remission with this chemotherapy regimen.  I also noted the side effects which include but are not limited to nausea, vomiting, hair loss, diarrhea, sedation, insomnia, hyperglycemia, high blood pressure, and cardiotoxicity.  The patient voices understanding of the risk and benefits and was agreeable to proceeding with treatment at this time.   The R-CHOP regimen consisted of rituximab 375 mg per metered squared IV on day 1, cyclophosphamide 750 mg per metered squared on day 1, doxorubicin 50 mg/m on day 1, vincristine 1.4 mg per metered squared with a max dose of 2 mg on day 1, and prednisone 100 mg orally days 1 through 5.  A cycle lasts for 21 days and number of cycles planned is 6.   IPI Score: 3 points. (53% progression free survival).   #Diffuse Large B Cell Lymphoma, Stage IV --Findings at this time are most consistent with a Stage IV diffuse large B-cell lymphoma  He is Stage IV, required 6 cycles of therapy.  --Baseline labs to include CBC, CMP, uric acid and LDH. --patient completed 6 cycles of R-CHOP with complete metabolic response noted on post treatment PET CT scan from 05/29/2021.  Plan: --per NCCN guidelines, plan for clinic visits q3-6 months for the first 2 years, with q 6 month CT scans.  --CT scan on 05/28/2022 show no evidence of  residual or recurrent disease. Next scan in August 2024 --Labs today show white blood cell count 5.0, hemoglobin 14.5, MCV 94.3, and platelets of 207 --Have the patient return to clinic in 3 months time. Plan for repeat CT scan in August 2024  # Contrast Allergy -- Developed welts and hives after his last CT scan on 05/28/2022. -- Patient will require premedication  prior to CT scans moving forward.    Orders Placed This Encounter  Procedures   CT CHEST ABDOMEN PELVIS W CONTRAST    Standing Status:   Future    Standing Expiration Date:   03/25/2024    Order Specific Question:   If indicated for the ordered procedure, I authorize the administration of contrast media per Radiology protocol    Answer:   Yes    Order Specific Question:   Does the patient have a contrast media/X-ray dye allergy?    Answer:   Yes    Order Specific Question:   Preferred imaging location?    Answer:   [facility]    Order Specific Question:   If indicated for the ordered procedure, I authorize the administration of oral contrast media per Radiology protocol    Answer:   Yes    All questions were answered. The patient knows to call the clinic with any problems, questions or concerns.  A total of more than 25 minutes were spent on this encounter and over half of that time was spent on counseling and coordination of care as outlined above.   Steve Barns, MD Department of Hematology/Oncology [facility] at [facility] Phone: [phone] Pager: [phone] Email: [email or web address]  03/26/2023 4:55 PM

## 2023-05-21 ENCOUNTER — Other Ambulatory Visit: Payer: Self-pay | Admitting: *Deleted

## 2023-05-21 ENCOUNTER — Encounter: Payer: Self-pay | Admitting: Hematology and Oncology

## 2023-05-21 MED ORDER — PREDNISONE 50 MG PO TABS: ORAL_TABLET | ORAL | 0 refills | Status: DC

## 2023-05-22 DIAGNOSIS — D485 Neoplasm of uncertain behavior of skin: Secondary | ICD-10-CM | POA: Diagnosis not present

## 2023-05-22 DIAGNOSIS — L218 Other seborrheic dermatitis: Secondary | ICD-10-CM | POA: Diagnosis not present

## 2023-05-29 ENCOUNTER — Ambulatory Visit (HOSPITAL_COMMUNITY)
Admission: RE | Admit: 2023-05-29 | Discharge: 2023-05-29 | Disposition: A | Discharge status: Home / Self Care | Payer: 59 | Source: Ambulatory Visit | Attending: Hematology and Oncology | Admitting: Hematology and Oncology

## 2023-05-29 DIAGNOSIS — N200 Calculus of kidney: Secondary | ICD-10-CM | POA: Diagnosis not present

## 2023-05-29 DIAGNOSIS — C8330 Diffuse large B-cell lymphoma, unspecified site: Secondary | ICD-10-CM | POA: Diagnosis not present

## 2023-05-29 DIAGNOSIS — C8335 Diffuse large B-cell lymphoma, lymph nodes of inguinal region and lower limb: Secondary | ICD-10-CM | POA: Diagnosis not present

## 2023-05-29 MED ORDER — IOHEXOL 300 MG/ML  SOLN
100.0000 mL | Freq: Once | INTRAMUSCULAR | Status: AC | PRN
  Administered 2023-05-29: 100 mL via INTRAVENOUS

## 2023-05-29 MED ORDER — SODIUM CHLORIDE (PF) 0.9 % IJ SOLN
INTRAMUSCULAR | Status: AC
  Filled 2023-05-29: qty 50

## 2023-06-04 ENCOUNTER — Telehealth: Payer: Self-pay

## 2023-06-04 NOTE — Telephone Encounter (Signed)
Steve Smith advised his CT scan did not show any evidence of residual or recurrent lymphoma. We will plan to see him as scheduled later this month. Pt voiced understanding.

## 2023-06-04 NOTE — Telephone Encounter (Signed)
-----   Message from Nurse Lanora Manis T sent at 06/04/2023  9:50 AM EDT -----  ----- Message ----- From: Jaci Standard, MD Sent: 06/03/2023   3:35 PM EDT To: Kyra Searles, RN  Please let Steve Smith know that his CT scan did not show any evidence of residual or recurrent lymphoma. We will plan to see him as scheduled later this month. ----- Message ----- From: Interface, Rad Results In Sent: 06/03/2023   2:03 PM EDT To: Jaci Standard, MD

## 2023-06-19 DIAGNOSIS — Z0001 Encounter for general adult medical examination with abnormal findings: Secondary | ICD-10-CM | POA: Diagnosis not present

## 2023-06-19 DIAGNOSIS — Z23 Encounter for immunization: Secondary | ICD-10-CM | POA: Diagnosis not present

## 2023-06-19 DIAGNOSIS — Z6838 Body mass index (BMI) 38.0-38.9, adult: Secondary | ICD-10-CM | POA: Diagnosis not present

## 2023-06-19 DIAGNOSIS — I700 Atherosclerosis of aorta: Secondary | ICD-10-CM | POA: Diagnosis not present

## 2023-06-19 DIAGNOSIS — I10 Essential (primary) hypertension: Secondary | ICD-10-CM | POA: Diagnosis not present

## 2023-06-19 DIAGNOSIS — C8335 Diffuse large B-cell lymphoma, lymph nodes of inguinal region and lower limb: Secondary | ICD-10-CM | POA: Diagnosis not present

## 2023-06-19 DIAGNOSIS — E6609 Other obesity due to excess calories: Secondary | ICD-10-CM | POA: Diagnosis not present

## 2023-06-25 ENCOUNTER — Inpatient Hospital Stay: Payer: 59 | Attending: Hematology and Oncology

## 2023-06-25 ENCOUNTER — Inpatient Hospital Stay: Payer: 59 | Admitting: Hematology and Oncology

## 2023-06-25 ENCOUNTER — Other Ambulatory Visit: Payer: Self-pay | Admitting: Hematology and Oncology

## 2023-06-25 VITALS — BP 132/89 | HR 88 | Temp 98.4°F | Resp 16 | Wt 289.5 lb

## 2023-06-25 DIAGNOSIS — C8335 Diffuse large B-cell lymphoma, lymph nodes of inguinal region and lower limb: Secondary | ICD-10-CM

## 2023-06-25 DIAGNOSIS — Z8572 Personal history of non-Hodgkin lymphomas: Secondary | ICD-10-CM | POA: Insufficient documentation

## 2023-06-25 LAB — CBC WITH DIFFERENTIAL (CANCER CENTER ONLY)
Abs Immature Granulocytes: 0.01 10*3/uL (ref 0.00–0.07)
Basophils Absolute: 0 10*3/uL (ref 0.0–0.1)
Basophils Relative: 1 %
Eosinophils Absolute: 0.2 10*3/uL (ref 0.0–0.5)
Eosinophils Relative: 4 %
HCT: 45 % (ref 39.0–52.0)
Hemoglobin: 15.3 g/dL (ref 13.0–17.0)
Immature Granulocytes: 0 %
Lymphocytes Relative: 23 %
Lymphs Abs: 1.1 10*3/uL (ref 0.7–4.0)
MCH: 32.1 pg (ref 26.0–34.0)
MCHC: 34 g/dL (ref 30.0–36.0)
MCV: 94.3 fL (ref 80.0–100.0)
Monocytes Absolute: 0.3 10*3/uL (ref 0.1–1.0)
Monocytes Relative: 7 %
Neutro Abs: 3.1 10*3/uL (ref 1.7–7.7)
Neutrophils Relative %: 65 %
Platelet Count: 209 10*3/uL (ref 150–400)
RBC: 4.77 MIL/uL (ref 4.22–5.81)
RDW: 13.2 % (ref 11.5–15.5)
WBC Count: 4.8 10*3/uL (ref 4.0–10.5)
nRBC: 0 % (ref 0.0–0.2)

## 2023-06-25 LAB — CMP (CANCER CENTER ONLY)
ALT: 17 U/L (ref 0–44)
AST: 18 U/L (ref 15–41)
Albumin: 4.3 g/dL (ref 3.5–5.0)
Alkaline Phosphatase: 76 U/L (ref 38–126)
Anion gap: 8 (ref 5–15)
BUN: 19 mg/dL (ref 6–20)
CO2: 26 mmol/L (ref 22–32)
Calcium: 9.7 mg/dL (ref 8.9–10.3)
Chloride: 107 mmol/L (ref 98–111)
Creatinine: 1.17 mg/dL (ref 0.61–1.24)
GFR, Estimated: >60 mL/min (ref >60)
Glucose, Bld: 95 mg/dL (ref 70–99)
Potassium: 3.7 mmol/L (ref 3.5–5.1)
Sodium: 141 mmol/L (ref 135–145)
Total Bilirubin: 0.9 mg/dL (ref 0.3–1.2)
Total Protein: 7.1 g/dL (ref 6.5–8.1)

## 2023-06-25 LAB — LACTATE DEHYDROGENASE: LDH: 177 U/L (ref 98–192)

## 2023-06-25 NOTE — Progress Notes (Signed)
[facility] [phone]   [phone]  PROGRESS NOTE  Patient Care Team: Elfredia Nevins, MD as PCP - General (Internal Medicine)  Hematological/Oncological History # Diffuse Large B Cell Lymphoma, Stage IV 12/17/2020: CT scan at [facility], results show left inguinal adenopathy and multiple solid splenic lesions probably of lymphoid origin  12/26/2020: excisional biopsy of left inguinal lymph node shows DLBCL.  01/04/2021: establish care with Dr. Leonides Schanz  01/09/2021: PET CT scan shows multiple hypermetabolic splenic lesions compatible with lymphoma.  There are also 2 hypermetabolic liver lesions also likely related to lymphoma.  No evidence of hypermetabolic activity in the neck, chest, abdomen or pelvis elsewhere. 01/21/2021: Cycle 1 Day 1 of R-CHOP chemotherapy 02/11/2021: Cycle 2 Day 1 of R-CHOP chemotherapy 03/04/2021: Cycle 3 Day 1 of R-CHOP chemotherapy 03/20/2021: PET CT scan shows excellent response, with resolution of splenic/liver lesions and only Deauville 1 and 2 lesions remaining elsewhere.  03/25/2021: Cycle 4 Day 1 of R-CHOP chemotherapy 04/17/2021: Cycle 5 Day 1 of R-CHOP chemotherapy 05/06/2021: Cycle 6 Day 1 of R-CHOP chemotherapy 05/29/2021: post treatment PET CT scan showed Deauville Score 1 of prior lymph nodes. Consistent with complete metabolic response. Enter surviellance.  11/29/2021: CT C/A/P showed no evidence of lymphadenopathy in the chest, abdomen, or pelvis. Evidence of prior left inguinal lymph node resection. 05/28/2022: CT C/A/P showed no evidence of recurrent disease in the chest, abdomen, or pelvis 11/21/2022: CT C/A/P showed no evidence of recurrent disease in the chest, abdomen, or pelvis 05/29/2023: CT C/A/P showed no evidence of recurrent disease in the chest, abdomen, or pelvis  Interval History:  Steve Smith 62 y.o. male with medical history significant for Stage IV DLBCL who presents for a follow up visit. The patient's last visit  was on 03/26/2023. In the interim since the last visit Mr. Proa has had no major changes in his health.   On exam today Mr. Stavis reports he has been well overall in the [place] her last visit.  He is had no major changes in his health and overall he feels good.  He reports that his energy is currently 9.5 out of 10.  His appetite is strong though he is trying to lose weight.  He like to get his weight down to 220 to 230 pounds.  He reports that he is already losing some weight steadily.  He is trying to avoid certain foods including carbs and sugar.  He has not had any recent infectious symptoms such as runny nose, sore throat, cough, though approximate 1 month ago he was having some allergies with wheezing.  He thinks this was secondary to ragweed.  He notes that he checks regularly for lymph nodes and has not noticed any lymph nodes in the neck, underarms or groin.  Overall he feels well and has no questions concerns or complaints today.  He denies any fevers, chills, sweats, nausea, vomiting or diarrhea.  A full 10 point ROS is listed below.  On a personal note the patient's wife developed fallopian tube cancer and is under the care of  Dr. Bertis Ruddy. She is currently in remission.   MEDICAL HISTORY:  Past Medical History:  Diagnosis Date   Basal cell carcinoma    Hypertension    Kidney stone     SURGICAL HISTORY: Past Surgical History:  Procedure Laterality Date   INGUINAL LYMPH NODE BIOPSY Left 12/26/2020   Procedure: LEFT INGUINAL EXCISIONAL  LYMPH NODE BIOPSY;  Surgeon: Lucretia Roers, MD;  Location: [facility];  Service: General;  Laterality: Left;   IR IMAGING GUIDED PORT INSERTION  01/17/2021   IR REMOVAL TUN ACCESS W/ PORT W/O FL MOD SED  06/24/2021   ROTATOR CUFF REPAIR     Left    SOCIAL HISTORY: Social History   Socioeconomic History   Marital status: Married    Spouse name: Not on file   Number of children: Not on file   Years of education: Not on file   Highest  education level: Not on file  Occupational History   Not on file  Tobacco Use   Smoking status: Never   Smokeless tobacco: Never  Substance and Sexual Activity   Alcohol use: No   Drug use: No   Sexual activity: Not on file  Other Topics Concern   Not on file  Social History Narrative   Not on file   Social Determinants of Health   Financial Resource Strain: Not on file  Food Insecurity: Not on file  Transportation Needs: Not on file  Physical Activity: Not on file  Stress: Not on file  Social Connections: Unknown (02/11/2022)   [facility]   Social Network    Social Network: Not on file  Intimate Partner Violence: Unknown (01/03/2022)   Received from [facility]   HITS    Physically Hurt: Not on file    Insult or Talk Down To: Not on file    Threaten Physical Harm: Not on file    Scream or Curse: Not on file    FAMILY HISTORY: Family History  Problem Relation Age of Onset   Basal cell carcinoma Mother    Liver cancer Father     ALLERGIES:  is allergic to ivp dye [iodinated contrast media].  MEDICATIONS:  Current Outpatient Medications  Medication Sig Dispense Refill   diphenhydrAMINE (BENADRYL) 50 MG tablet Take 1 tablet (50 mg total) by mouth at bedtime as needed for itching. Take 1 hour prior to CT scan. 30 tablet 0   olmesartan (BENICAR) 40 MG tablet Take 40 mg by mouth daily.     predniSONE (DELTASONE) 50 MG tablet Take 1 pill 13 hours prior to CT scan, the next 7 hours prior to CT scan, and the last 1 hour prior to CT scan. 3 tablet 0   No current facility-administered medications for this visit.    REVIEW OF SYSTEMS:   Constitutional: ( - ) fevers, ( - )  chills , ( - ) night sweats Eyes: ( - ) blurriness of vision, ( - ) double vision, ( - ) watery eyes Ears, nose, mouth, throat, and face: ( - ) mucositis, ( - ) sore throat Respiratory: ( - ) cough, ( - ) dyspnea, ( - ) wheezes Cardiovascular: ( - )  palpitation, ( - ) chest discomfort, ( - ) lower extremity swelling Gastrointestinal:  ( - ) nausea, ( - ) heartburn, ( - ) change in bowel habits Skin: ( - ) abnormal skin rashes Lymphatics: ( - ) new lymphadenopathy, ( - ) easy bruising Neurological: ( - ) numbness, ( - ) tingling, ( - ) new weaknesses Behavioral/Psych: ( - ) mood change, ( - ) new changes  All other systems were reviewed with the patient and are negative.  PHYSICAL EXAMINATION: ECOG PERFORMANCE STATUS: 1 - Symptomatic but completely ambulatory  Vitals:   06/25/23 1531  BP: 132/89  Pulse: 88  Resp: 16  Temp: 98.4 F (36.9 C)  SpO2:  98%     Filed Weights   06/25/23 1531  Weight: 289 lb 8 oz (131.3 kg)    GENERAL: well appearing middle aged Caucasian male. alert, no distress and comfortable SKIN: skin color, texture, turgor are normal, no rashes or significant lesions EYES: conjunctiva are pink and non-injected, sclera clear LUNGS: clear to auscultation and percussion with normal breathing effort HEART: regular rate & rhythm and no murmurs and no lower extremity edema Musculoskeletal: no cyanosis of digits and no clubbing  PSYCH: alert & oriented x 3, fluent speech NEURO: no focal motor/sensory deficits  LABORATORY DATA:  I have reviewed the data as listed    Latest Ref Rng & Units 06/25/2023    2:58 PM 03/26/2023    2:33 PM 12/18/2022    2:54 PM  CBC  WBC 4.0 - 10.5 K/uL 4.8  5.0  5.0   Hemoglobin 13.0 - 17.0 g/dL 16.1  09.6  04.5   Hematocrit 39.0 - 52.0 % 45.0  43.2  43.1   Platelets 150 - 400 K/uL 209  207  212        Latest Ref Rng & Units 06/25/2023    2:58 PM 03/26/2023    2:33 PM 12/18/2022    2:54 PM  CMP  Glucose 70 - 99 mg/dL 95  409  811   BUN 6 - 20 mg/dL 19  18  14    Creatinine 0.61 - 1.24 mg/dL 9.14  7.82  9.56   Sodium 135 - 145 mmol/L 141  140  140   Potassium 3.5 - 5.1 mmol/L 3.7  3.7  3.9   Chloride 98 - 111 mmol/L 107  108  106   CO2 22 - 32 mmol/L 26  26  26    Calcium 8.9  - 10.3 mg/dL 9.7  9.4  9.1   Total Protein 6.5 - 8.1 g/dL 7.1  6.6  6.3   Total Bilirubin 0.3 - 1.2 mg/dL 0.9  0.5  0.7   Alkaline Phos 38 - 126 U/L 76  72  87   AST 15 - 41 U/L 18  14  14    ALT 0 - 44 U/L 17  13  15      RADIOGRAPHIC STUDIES: CT CHEST ABDOMEN PELVIS W CONTRAST  Result Date: 06/03/2023 CLINICAL DATA:  Diffuse large B-cell lymphoma. Lymph nodes in the inguinal region. * Tracking Code: BO * EXAM: CT CHEST, ABDOMEN, AND PELVIS WITH CONTRAST TECHNIQUE: Multidetector CT imaging of the chest, abdomen and pelvis was performed following the standard protocol during bolus administration of intravenous contrast. RADIATION DOSE REDUCTION: This exam was performed according to the departmental dose-optimization program which includes automated exposure control, adjustment of the mA and/or kV according to patient size and/or use of iterative reconstruction technique. CONTRAST:  OMNIPAQUE IOHEXOL 300 MG/ML  SOLN Patient pre-medicated for iodine allergy. No adverse event reported. COMPARISON:  CT 11/18/2022 FINDINGS: CT CHEST FINDINGS Cardiovascular: No significant vascular findings. Normal heart size. No pericardial effusion. Mediastinum/Nodes: No axillary or supraclavicular adenopathy. No mediastinal or hilar adenopathy. No pericardial fluid. Esophagus normal. Lungs/Pleura: No suspicious pulmonary nodules. Normal pleural. Airways normal. Musculoskeletal: No aggressive osseous lesion. CT ABDOMEN AND PELVIS FINDINGS Hepatobiliary: No focal hepatic lesion. No biliary ductal dilatation. Gallbladder is normal. Common bile duct is normal. Pancreas: Pancreas is normal. No ductal dilatation. No pancreatic inflammation. Spleen: Multiple small calcified granulomata spleen. Adrenals/urinary tract: Adrenal glands normal. Single nonobstructing 2 mm calculus in the LEFT kidney. No ureterolithiasis. No bladder calculi. Stomach/Bowel: Stomach,  small bowel, appendix, and cecum are normal. Vascular/Lymphatic:  Abdominal aorta is normal caliber. There is no retroperitoneal or periportal lymphadenopathy. No pelvic lymphadenopathy. Reproductive: Prostate unremarkable Other: No free fluid. Musculoskeletal: No aggressive osseous lesion. IMPRESSION: CHEST: No evidence of lymphoma in the chest. PELVIS: 1. No evidence of lymphoma in the abdomen pelvis. 2. Normal volume spleen. 3. Nonobstructing LEFT renal calculus. Electronically Signed   By: Genevive Bi M.D.   On: 06/03/2023 14:00    ASSESSMENT & PLAN Steve Smith 62 y.o. male with medical history significant for Stage IV DLBCL who presents for a follow up visit.  After review the labs, the records, schedule the patient the findings most consistent with a diffuse large B-cell lymphoma with staging in process.  We completed the staging with a PET CT scan which showed involvement of the liver and spleen.  At this time his disease does appear as late stage and we will plan for at least 6 cycles of R-CHOP followed by repeat scans.   Previously we discussed the risks and benefits of R-CHOP chemotherapy.  We noted the nature of diffuse large B-cell lymphoma and the possibility of achieving a complete remission with this chemotherapy regimen.  I also noted the side effects which include but are not limited to nausea, vomiting, hair loss, diarrhea, sedation, insomnia, hyperglycemia, high blood pressure, and cardiotoxicity.  The patient voices understanding of the risk and benefits and was agreeable to proceeding with treatment at this time.   The R-CHOP regimen consisted of rituximab 375 mg per metered squared IV on day 1, cyclophosphamide 750 mg per metered squared on day 1, doxorubicin 50 mg/m on day 1, vincristine 1.4 mg per metered squared with a max dose of 2 mg on day 1, and prednisone 100 mg orally days 1 through 5.  A cycle lasts for 21 days and number of cycles planned is 6.   IPI Score: 3 points. (53% progression free survival).   #Diffuse Large B Cell  Lymphoma, Stage IV --Findings at this time are most consistent with a Stage IV diffuse large B-cell lymphoma  He is Stage IV, required 6 cycles of therapy.  --Baseline labs to include CBC, CMP, uric acid and LDH. --patient completed 6 cycles of R-CHOP with complete metabolic response noted on post treatment PET CT scan from 05/29/2021.  Plan: --per NCCN guidelines, plan for clinic visits q 6-12 months for the next 3 years, with q 12 month CT scans.  --CT scan on 05/28/2022 show no evidence of residual or recurrent disease. Next scan in August 2024 --Labs today show white blood cell count 4.8, hemoglobin 15.3, MCV 94.3, and platelets of 209 --Have the patient return to clinic in 6 months time. Plan for repeat CT scan in August 2025  # Contrast Allergy -- Developed welts and hives after his last CT scan on 05/28/2022. -- Patient will require premedication prior to CT scans moving forward.    No orders of the defined types were placed in this encounter.   All questions were answered. The patient knows to call the clinic with any problems, questions or concerns.  A total of more than 25 minutes were spent on this encounter and over half of that time was spent on counseling and coordination of care as outlined above.   Ulysees Barns, MD Department of Hematology/Oncology [facility] at [facility] Phone: [phone] Pager: [phone] Email: [email or web address]  06/25/2023 4:46 PM

## 2023-06-26 ENCOUNTER — Telehealth: Payer: Self-pay | Admitting: Hematology and Oncology

## 2023-08-19 ENCOUNTER — Encounter: Payer: Self-pay | Admitting: Hematology and Oncology

## 2023-08-19 NOTE — Telephone Encounter (Signed)
Telephone call  

## 2023-12-17 ENCOUNTER — Other Ambulatory Visit: Payer: Self-pay | Admitting: Hematology and Oncology

## 2023-12-17 DIAGNOSIS — C8335 Diffuse large B-cell lymphoma, lymph nodes of inguinal region and lower limb: Secondary | ICD-10-CM

## 2023-12-17 NOTE — Progress Notes (Signed)
 No show

## 2023-12-18 ENCOUNTER — Inpatient Hospital Stay: Payer: 59 | Admitting: Hematology and Oncology

## 2023-12-18 ENCOUNTER — Telehealth: Payer: Self-pay | Admitting: Hematology and Oncology

## 2023-12-18 ENCOUNTER — Inpatient Hospital Stay: Payer: 59

## 2023-12-18 DIAGNOSIS — C8335 Diffuse large B-cell lymphoma, lymph nodes of inguinal region and lower limb: Secondary | ICD-10-CM

## 2023-12-22 ENCOUNTER — Encounter: Payer: Self-pay | Admitting: Hematology and Oncology

## 2024-01-06 ENCOUNTER — Telehealth: Payer: Self-pay | Admitting: Physician Assistant

## 2024-01-06 NOTE — Telephone Encounter (Signed)
 Left a detailed message of appointment details. Informed patient to call back if appointments do not work.

## 2024-01-26 ENCOUNTER — Inpatient Hospital Stay: Attending: Physician Assistant | Admitting: Physician Assistant

## 2024-01-26 ENCOUNTER — Inpatient Hospital Stay

## 2024-01-26 VITALS — BP 159/92 | HR 83 | Temp 97.7°F | Resp 16 | Wt 295.4 lb

## 2024-01-26 DIAGNOSIS — C8335 Diffuse large B-cell lymphoma, lymph nodes of inguinal region and lower limb: Secondary | ICD-10-CM | POA: Diagnosis not present

## 2024-01-26 DIAGNOSIS — Z91041 Radiographic dye allergy status: Secondary | ICD-10-CM | POA: Insufficient documentation

## 2024-01-26 DIAGNOSIS — Z9221 Personal history of antineoplastic chemotherapy: Secondary | ICD-10-CM | POA: Diagnosis not present

## 2024-01-26 DIAGNOSIS — Z8572 Personal history of non-Hodgkin lymphomas: Secondary | ICD-10-CM | POA: Insufficient documentation

## 2024-01-26 LAB — CMP (CANCER CENTER ONLY)
ALT: 14 U/L (ref 0–44)
AST: 14 U/L — ABNORMAL LOW (ref 15–41)
Albumin: 4.1 g/dL (ref 3.5–5.0)
Alkaline Phosphatase: 76 U/L (ref 38–126)
Anion gap: 7 (ref 5–15)
BUN: 16 mg/dL (ref 8–23)
CO2: 26 mmol/L (ref 22–32)
Calcium: 9.3 mg/dL (ref 8.9–10.3)
Chloride: 108 mmol/L (ref 98–111)
Creatinine: 1.21 mg/dL (ref 0.61–1.24)
GFR, Estimated: >60 mL/min (ref >60)
Glucose, Bld: 113 mg/dL — ABNORMAL HIGH (ref 70–99)
Potassium: 4.1 mmol/L (ref 3.5–5.1)
Sodium: 141 mmol/L (ref 135–145)
Total Bilirubin: 0.5 mg/dL (ref 0.0–1.2)
Total Protein: 6.6 g/dL (ref 6.5–8.1)

## 2024-01-26 LAB — CBC WITH DIFFERENTIAL (CANCER CENTER ONLY)
Abs Immature Granulocytes: 0.01 10*3/uL (ref 0.00–0.07)
Basophils Absolute: 0 10*3/uL (ref 0.0–0.1)
Basophils Relative: 1 %
Eosinophils Absolute: 0.2 10*3/uL (ref 0.0–0.5)
Eosinophils Relative: 4 %
HCT: 43.5 % (ref 39.0–52.0)
Hemoglobin: 15.1 g/dL (ref 13.0–17.0)
Immature Granulocytes: 0 %
Lymphocytes Relative: 24 %
Lymphs Abs: 1.2 10*3/uL (ref 0.7–4.0)
MCH: 31.1 pg (ref 26.0–34.0)
MCHC: 34.7 g/dL (ref 30.0–36.0)
MCV: 89.7 fL (ref 80.0–100.0)
Monocytes Absolute: 0.4 10*3/uL (ref 0.1–1.0)
Monocytes Relative: 8 %
Neutro Abs: 3.2 10*3/uL (ref 1.7–7.7)
Neutrophils Relative %: 63 %
Platelet Count: 216 10*3/uL (ref 150–400)
RBC: 4.85 MIL/uL (ref 4.22–5.81)
RDW: 13.1 % (ref 11.5–15.5)
WBC Count: 5.1 10*3/uL (ref 4.0–10.5)
nRBC: 0 % (ref 0.0–0.2)

## 2024-01-26 MED ORDER — PREDNISONE 50 MG PO TABS: ORAL_TABLET | ORAL | 0 refills | Status: DC

## 2024-01-26 NOTE — Progress Notes (Signed)
 [facility] [phone]   [phone]  PROGRESS NOTE  Steve Smith Care Team: Kathyleen Parkins, MD as PCP - General (Internal Medicine)  Hematological/Oncological History # Diffuse Large B Cell Lymphoma, Stage IV 12/17/2020: CT scan at [facility], results show left inguinal adenopathy and multiple solid splenic lesions probably of lymphoid origin  12/26/2020: excisional biopsy of left inguinal lymph node shows DLBCL.  01/04/2021: establish care with Dr. Rosaline Coma  01/09/2021: PET CT scan shows multiple hypermetabolic splenic lesions compatible with lymphoma.  There are also 2 hypermetabolic liver lesions also likely related to lymphoma.  No evidence of hypermetabolic activity in the neck, chest, abdomen or pelvis elsewhere. 01/21/2021: Cycle 1 Day 1 of R-CHOP chemotherapy 02/11/2021: Cycle 2 Day 1 of R-CHOP chemotherapy 03/04/2021: Cycle 3 Day 1 of R-CHOP chemotherapy 03/20/2021: PET CT scan shows excellent response, with resolution of splenic/liver lesions and only Deauville 1 and 2 lesions remaining elsewhere.  03/25/2021: Cycle 4 Day 1 of R-CHOP chemotherapy 04/17/2021: Cycle 5 Day 1 of R-CHOP chemotherapy 05/06/2021: Cycle 6 Day 1 of R-CHOP chemotherapy 05/29/2021: post treatment PET CT scan showed Deauville Score 1 of prior lymph nodes. Consistent with complete metabolic response. Enter surviellance.  11/29/2021: CT C/A/P showed no evidence of lymphadenopathy in the chest, abdomen, or pelvis. Evidence of prior left inguinal lymph node resection. 05/28/2022: CT C/A/P showed no evidence of recurrent disease in the chest, abdomen, or pelvis 11/21/2022: CT C/A/P showed no evidence of recurrent disease in the chest, abdomen, or pelvis 05/29/2023: CT C/A/P showed no evidence of recurrent disease in the chest, abdomen, or pelvis  Interval History:  Steve Smith 63 y.o. male with medical history significant for Stage IV DLBCL who presents for a follow up visit. The Steve Smith's last visit  was on 06/25/2023. In the interim since the last visit Steve Smith has had no major changes in his health.   On exam today Steve Smith reports he is doing well without any major changes to his health. He reports that he feels back to his baseline state of health before his diagnosis. His energy and appetite are overall stable. He is able to complete his ADLs on his own. He denies nausea, vomiting or bowel habit changes. He denies easy bruising or signs of active bleeding. He denies any palpable lymph nodes.Overall he feels well and has no questions concerns or complaints today.  He denies any fevers, chills, sweats, shortness of breath, chest pain or cough. He has no other complaints. Rest of the 10 point ROS is listed below.  MEDICAL HISTORY:  Past Medical History:  Diagnosis Date   Basal cell carcinoma    Hypertension    Kidney stone     SURGICAL HISTORY: Past Surgical History:  Procedure Laterality Date   INGUINAL LYMPH NODE BIOPSY Left 12/26/2020   Procedure: LEFT INGUINAL EXCISIONAL  LYMPH NODE BIOPSY;  Surgeon: Awilda Bogus, MD;  Location: [facility];  Service: General;  Laterality: Left;   IR IMAGING GUIDED PORT INSERTION  01/17/2021   IR REMOVAL TUN ACCESS W/ PORT W/O FL MOD SED  06/24/2021   ROTATOR CUFF REPAIR     Left    SOCIAL HISTORY: Social History   Socioeconomic History   Marital status: Married    Spouse name: Not on file   Number of children: Not on file   Years of education: Not on file   Highest education level: Not on file  Occupational History   Not on file  Tobacco Use  Smoking status: Never   Smokeless tobacco: Never  Substance and Sexual Activity   Alcohol use: No   Drug use: No   Sexual activity: Not on file  Other Topics Concern   Not on file  Social History Narrative   Not on file   Social Drivers of Health   Financial Resource Strain: Not on file  Food Insecurity: Not on file  Transportation Needs: Not on file  Physical Activity: Not on  file  Stress: Not on file  Social Connections: Unknown (02/11/2022)   Received from [facility], [facility]   Social Network    Social Network: Not on file  Intimate Partner Violence: Unknown (01/03/2022)   Received from [facility], [facility]   HITS    Physically Hurt: Not on file    Insult or Talk Down To: Not on file    Threaten Physical Harm: Not on file    Scream or Curse: Not on file    FAMILY HISTORY: Family History  Problem Relation Age of Onset   Basal cell carcinoma Mother    Liver cancer Father     ALLERGIES:  is allergic to ivp dye [iodinated contrast media].  MEDICATIONS:  Current Outpatient Medications  Medication Sig Dispense Refill   diphenhydrAMINE  (BENADRYL ) 50 MG tablet Take 1 tablet (50 mg total) by mouth at bedtime as needed for itching. Take 1 hour prior to CT scan. 30 tablet 0   olmesartan (BENICAR) 40 MG tablet Take 40 mg by mouth daily.     predniSONE  (DELTASONE ) 50 MG tablet Take 1 pill 13 hours prior to CT scan, the next 7 hours prior to CT scan, and the last 1 hour prior to CT scan. 3 tablet 0   No current facility-administered medications for this visit.    REVIEW OF SYSTEMS:   Constitutional: ( - ) fevers, ( - )  chills , ( - ) night sweats Eyes: ( - ) blurriness of vision, ( - ) double vision, ( - ) watery eyes Ears, nose, mouth, throat, and face: ( - ) mucositis, ( - ) sore throat Respiratory: ( - ) cough, ( - ) dyspnea, ( - ) wheezes Cardiovascular: ( - ) palpitation, ( - ) chest discomfort, ( - ) lower extremity swelling Gastrointestinal:  ( - ) nausea, ( - ) heartburn, ( - ) change in bowel habits Skin: ( - ) abnormal skin rashes Lymphatics: ( - ) new lymphadenopathy, ( - ) easy bruising Neurological: ( - ) numbness, ( - ) tingling, ( - ) new weaknesses Behavioral/Psych: ( - ) mood change, ( - ) new changes  All other systems were reviewed with the Steve Smith and are negative.  PHYSICAL EXAMINATION: ECOG PERFORMANCE STATUS: 1 -  Symptomatic but completely ambulatory  Vitals:   01/26/24 1006  BP: (!) 159/92  Pulse: 83  Resp: 16  Temp: 97.7 F (36.5 C)  SpO2: 96%     Filed Weights   01/26/24 1006  Weight: 295 lb 6.4 oz (134 kg)    GENERAL: well appearing middle aged Caucasian male. alert, no distress and comfortable SKIN: skin color, texture, turgor are normal, no rashes or significant lesions EYES: conjunctiva are pink and non-injected, sclera clear LYMPH: No palpable lymph nodes in the neck/axillary/supraclavicular regions.  LUNGS: clear to auscultation and percussion with normal breathing effort ABDOMEN: No palpable splenomegaly. No palpable masses. No distention.  HEART: regular rate & rhythm and no murmurs and no lower extremity edema Musculoskeletal: no cyanosis  of digits and no clubbing  PSYCH: alert & oriented x 3, fluent speech NEURO: no focal motor/sensory deficits  LABORATORY DATA:  I have reviewed the data as listed    Latest Ref Rng & Units 01/26/2024    9:28 AM 06/25/2023    2:58 PM 03/26/2023    2:33 PM  CBC  WBC 4.0 - 10.5 K/uL 5.1  4.8  5.0   Hemoglobin 13.0 - 17.0 g/dL 81.1  91.4  78.2   Hematocrit 39.0 - 52.0 % 43.5  45.0  43.2   Platelets 150 - 400 K/uL 216  209  207        Latest Ref Rng & Units 01/26/2024    9:28 AM 06/25/2023    2:58 PM 03/26/2023    2:33 PM  CMP  Glucose 70 - 99 mg/dL 956  95  213   BUN 8 - 23 mg/dL 16  19  18    Creatinine 0.61 - 1.24 mg/dL 0.86  5.78  4.69   Sodium 135 - 145 mmol/L 141  141  140   Potassium 3.5 - 5.1 mmol/L 4.1  3.7  3.7   Chloride 98 - 111 mmol/L 108  107  108   CO2 22 - 32 mmol/L 26  26  26    Calcium 8.9 - 10.3 mg/dL 9.3  9.7  9.4   Total Protein 6.5 - 8.1 g/dL 6.6  7.1  6.6   Total Bilirubin 0.0 - 1.2 mg/dL 0.5  0.9  0.5   Alkaline Phos 38 - 126 U/L 76  76  72   AST 15 - 41 U/L 14  18  14    ALT 0 - 44 U/L 14  17  13      RADIOGRAPHIC STUDIES: No results found.  ASSESSMENT & PLAN Steve Smith is a 63 y.o.  male with  medical history significant for Stage IV DLBCL who presents for a follow up visit.  After review the labs, the records, schedule the Steve Smith the findings most consistent with a diffuse large B-cell lymphoma with staging in process.  We completed the staging with a PET CT scan which showed involvement of the liver and spleen.  At this time his disease does appear as late stage and we will plan for at least 6 cycles of R-CHOP followed by repeat scans.   Previously we discussed the risks and benefits of R-CHOP chemotherapy.  We noted the nature of diffuse large B-cell lymphoma and the possibility of achieving a complete remission with this chemotherapy regimen.  I also noted the side effects which include but are not limited to nausea, vomiting, hair loss, diarrhea, sedation, insomnia, hyperglycemia, high blood pressure, and cardiotoxicity.  The Steve Smith voices understanding of the risk and benefits and was agreeable to proceeding with treatment at this time.   The R-CHOP regimen consisted of rituximab  375 mg per metered squared IV on day 1, cyclophosphamide  750 mg per metered squared on day 1, doxorubicin  50 mg/m on day 1, vincristine  1.4 mg per metered squared with a max dose of 2 mg on day 1, and prednisone  100 mg orally days 1 through 5.  A cycle lasts for 21 days and number of cycles planned is 6.   IPI Score: 3 points. (53% progression free survival).   #Diffuse Large B Cell Lymphoma, Stage IV --Findings at this time are most consistent with a Stage IV diffuse large B-cell lymphoma  He is Stage IV, required 6 cycles of therapy.  --Baseline labs  to include CBC, CMP, uric acid and LDH. --Steve Smith completed 6 cycles of R-CHOP with complete metabolic response noted on post treatment PET CT scan from 05/29/2021.  Plan: --per NCCN guidelines, plan for clinic visits q 6-12 months for the next 3 years, with q 12 month CT scans.  --Most recent CT scan on 05/29/2023 show no evidence of residual or recurrent  disease.  --Labs today show white blood cell count 5.1, hemoglobin 15.1, MCV 89.7, and platelets of 216. Creatinine and LFTs in range.  --No clinical signs or symptoms of relapse.  --RTC in 6 months with labs and follow up. Plan for repeat CT scan in September 2025 (two weeks prior to visit).  # Contrast Allergy -- Developed welts and hives after his last CT scan on 05/28/2022. -- Steve Smith will require premedication prior to CT scans moving forward. Sent refill for next scan.     No orders of the defined types were placed in this encounter.   All questions were answered. The Steve Smith knows to call the clinic with any problems, questions or concerns.   I have spent a total of 25 minutes minutes of face-to-face and non-face-to-face time, preparing to see the Steve Smith, performing a medically appropriate examination, counseling and educating the Steve Smith, ordering medications/tests/procedures, documenting clinical information in the electronic health record, independently interpreting results and communicating results to the Steve Smith, and care coordination.   Wyline Hearing PA-C Dept of Hematology and Oncology [facility] at [facility] Phone: [phone]   01/26/2024 10:11 AM

## 2024-06-04 ENCOUNTER — Encounter: Payer: Self-pay | Admitting: Hematology and Oncology

## 2024-06-06 ENCOUNTER — Other Ambulatory Visit: Payer: Self-pay | Admitting: *Deleted

## 2024-06-06 MED ORDER — PREDNISONE 50 MG PO TABS
ORAL_TABLET | ORAL | 0 refills | Status: AC
Start: 2024-06-06 — End: ?

## 2024-06-10 ENCOUNTER — Ambulatory Visit (HOSPITAL_COMMUNITY)
Admission: RE | Admit: 2024-06-10 | Discharge: 2024-06-10 | Disposition: A | Discharge status: Home / Self Care | Source: Ambulatory Visit | Attending: Physician Assistant | Admitting: Physician Assistant

## 2024-06-10 DIAGNOSIS — N400 Enlarged prostate without lower urinary tract symptoms: Secondary | ICD-10-CM | POA: Diagnosis not present

## 2024-06-10 DIAGNOSIS — K5730 Diverticulosis of large intestine without perforation or abscess without bleeding: Secondary | ICD-10-CM | POA: Diagnosis not present

## 2024-06-10 DIAGNOSIS — K429 Umbilical hernia without obstruction or gangrene: Secondary | ICD-10-CM | POA: Diagnosis not present

## 2024-06-10 DIAGNOSIS — C8335 Diffuse large B-cell lymphoma, lymph nodes of inguinal region and lower limb: Secondary | ICD-10-CM | POA: Insufficient documentation

## 2024-06-10 DIAGNOSIS — Z8572 Personal history of non-Hodgkin lymphomas: Secondary | ICD-10-CM | POA: Diagnosis not present

## 2024-06-10 MED ORDER — IOHEXOL 300 MG/ML  SOLN
100.0000 mL | Freq: Once | INTRAMUSCULAR | Status: AC | PRN
  Administered 2024-06-10: 100 mL via INTRAVENOUS

## 2024-06-10 MED ORDER — BARIUM SULFATE 2 % PO SUSP
900.0000 mL | Freq: Once | ORAL | Status: AC
  Administered 2024-06-10: 900 mL via ORAL

## 2024-06-15 ENCOUNTER — Ambulatory Visit: Payer: Self-pay | Admitting: *Deleted

## 2024-06-15 NOTE — Telephone Encounter (Addendum)
 Contacted patient per MD request with message below. Patient verbalized understanding of result information. He confirmed appt with Dr. Federico on 10/10.    ----- Message from Norleen ONEIDA Federico IV sent at 06/14/2024 10:42 AM EDT ----- Please let Mr. Steve Smith know that his CT scan looks excellent.  There is no evidence of residual or recurrent lymphoma.  We will plan to see him back as scheduled in October.

## 2024-07-01 ENCOUNTER — Ambulatory Visit: Admitting: Hematology and Oncology

## 2024-07-01 ENCOUNTER — Other Ambulatory Visit

## 2024-07-08 ENCOUNTER — Inpatient Hospital Stay: Admitting: Hematology and Oncology

## 2024-07-08 ENCOUNTER — Inpatient Hospital Stay: Attending: Hematology and Oncology

## 2024-07-08 NOTE — Progress Notes (Deleted)
 [facility] [phone]   [phone]) [phone]  PROGRESS NOTE  Patient Care Team: Bertell Satterfield, MD as PCP - General (Internal Medicine)  Hematological/Oncological History # Diffuse Large B Cell Lymphoma, Stage IV 12/17/2020: CT scan at [facility], results show left inguinal adenopathy and multiple solid splenic lesions probably of lymphoid origin  12/26/2020: excisional biopsy of left inguinal lymph node shows DLBCL.  01/04/2021: establish care with Dr. Federico  01/09/2021: PET CT scan shows multiple hypermetabolic splenic lesions compatible with lymphoma.  There are also 2 hypermetabolic liver lesions also likely related to lymphoma.  No evidence of hypermetabolic activity in the neck, chest, abdomen or pelvis elsewhere. 01/21/2021: Cycle 1 Day 1 of R-CHOP chemotherapy 02/11/2021: Cycle 2 Day 1 of R-CHOP chemotherapy 03/04/2021: Cycle 3 Day 1 of R-CHOP chemotherapy 03/20/2021: PET CT scan shows excellent response, with resolution of splenic/liver lesions and only Deauville 1 and 2 lesions remaining elsewhere.  03/25/2021: Cycle 4 Day 1 of R-CHOP chemotherapy 04/17/2021: Cycle 5 Day 1 of R-CHOP chemotherapy 05/06/2021: Cycle 6 Day 1 of R-CHOP chemotherapy 05/29/2021: post treatment PET CT scan showed Deauville Score 1 of prior lymph nodes. Consistent with complete metabolic response. Enter surviellance.  11/29/2021: CT C/A/P showed no evidence of lymphadenopathy in the chest, abdomen, or pelvis. Evidence of prior left inguinal lymph node resection. 05/28/2022: CT C/A/P showed no evidence of recurrent disease in the chest, abdomen, or pelvis 11/21/2022: CT C/A/P showed no evidence of recurrent disease in the chest, abdomen, or pelvis 05/29/2023: CT C/A/P showed no evidence of recurrent disease in the chest, abdomen, or pelvis  Interval History:  Steve Smith 63 y.o. male with medical history significant for Stage IV DLBCL who presents for a follow up visit. The patient's last visit  was on 06/25/2023. In the interim since the last visit Steve Smith has had no major changes in his health.   On exam today Steve Smith reports he is doing well without any major changes to his health. He reports that he feels back to his baseline state of health before his diagnosis. His energy and appetite are overall stable. He is able to complete his ADLs on his own. He denies nausea, vomiting or bowel habit changes. He denies easy bruising or signs of active bleeding. He denies any palpable lymph nodes.Overall he feels well and has no questions concerns or complaints today.  He denies any fevers, chills, sweats, shortness of breath, chest pain or cough. He has no other complaints. Rest of the 10 point ROS is listed below.  MEDICAL HISTORY:  Past Medical History:  Diagnosis Date   Basal cell carcinoma    Hypertension    Kidney stone     SURGICAL HISTORY: Past Surgical History:  Procedure Laterality Date   INGUINAL LYMPH NODE BIOPSY Left 12/26/2020   Procedure: LEFT INGUINAL EXCISIONAL  LYMPH NODE BIOPSY;  Surgeon: Kallie Manuelita BROCKS, MD;  Location: [facility];  Service: General;  Laterality: Left;   IR IMAGING GUIDED PORT INSERTION  01/17/2021   IR REMOVAL TUN ACCESS W/ PORT W/O FL MOD SED  06/24/2021   ROTATOR CUFF REPAIR     Left    SOCIAL HISTORY: Social History   Socioeconomic History   Marital status: Married    Spouse name: Not on file   Number of children: Not on file   Years of education: Not on file   Highest education level: Not on file  Occupational History   Not on file  Tobacco Use  Smoking status: Never   Smokeless tobacco: Never  Substance and Sexual Activity   Alcohol use: No   Drug use: No   Sexual activity: Not on file  Other Topics Concern   Not on file  Social History Narrative   Not on file   Social Drivers of Health   Financial Resource Strain: Not on file  Food Insecurity: Not on file  Transportation Needs: Not on file  Physical Activity: Not on  file  Stress: Not on file  Social Connections: Unknown (02/11/2022)   [facility]   Social Network    Social Network: Not on file  Intimate Partner Violence: Unknown (01/03/2022)   Received from [facility]   HITS    Physically Hurt: Not on file    Insult or Talk Down To: Not on file    Threaten Physical Harm: Not on file    Scream or Curse: Not on file    FAMILY HISTORY: Family History  Problem Relation Age of Onset   Basal cell carcinoma Mother    Liver cancer Father     ALLERGIES:  is allergic to ivp dye [iodinated contrast media].  MEDICATIONS:  Current Outpatient Medications  Medication Sig Dispense Refill   diphenhydrAMINE  (BENADRYL ) 50 MG tablet Take 1 tablet (50 mg total) by mouth at bedtime as needed for itching. Take 1 hour prior to CT scan. 30 tablet 0   olmesartan (BENICAR) 40 MG tablet Take 40 mg by mouth daily.     predniSONE  (DELTASONE ) 50 MG tablet Take 1 pill 13 hours prior to CT scan, the next 7 hours prior to CT scan, and the last 1 hour (with benadryl ) prior to CT scan. 3 tablet 0   No current facility-administered medications for this visit.    REVIEW OF SYSTEMS:   Constitutional: ( - ) fevers, ( - )  chills , ( - ) night sweats Eyes: ( - ) blurriness of vision, ( - ) double vision, ( - ) watery eyes Ears, nose, mouth, throat, and face: ( - ) mucositis, ( - ) sore throat Respiratory: ( - ) cough, ( - ) dyspnea, ( - ) wheezes Cardiovascular: ( - ) palpitation, ( - ) chest discomfort, ( - ) lower extremity swelling Gastrointestinal:  ( - ) nausea, ( - ) heartburn, ( - ) change in bowel habits Skin: ( - ) abnormal skin rashes Lymphatics: ( - ) new lymphadenopathy, ( - ) easy bruising Neurological: ( - ) numbness, ( - ) tingling, ( - ) new weaknesses Behavioral/Psych: ( - ) mood change, ( - ) new changes  All other systems were reviewed with the patient and are negative.  PHYSICAL EXAMINATION: ECOG PERFORMANCE STATUS: 1 - Symptomatic but  completely ambulatory  There were no vitals filed for this visit.    There were no vitals filed for this visit.   GENERAL: well appearing middle aged Caucasian male. alert, no distress and comfortable SKIN: skin color, texture, turgor are normal, no rashes or significant lesions EYES: conjunctiva are pink and non-injected, sclera clear LYMPH: No palpable lymph nodes in the neck/axillary/supraclavicular regions.  LUNGS: clear to auscultation and percussion with normal breathing effort ABDOMEN: No palpable splenomegaly. No palpable masses. No distention.  HEART: regular rate & rhythm and no murmurs and no lower extremity edema Musculoskeletal: no cyanosis of digits and no clubbing  PSYCH: alert & oriented x 3, fluent speech NEURO: no focal motor/sensory deficits  LABORATORY DATA:  I have reviewed  the data as listed    Latest Ref Rng & Units 01/26/2024    9:28 AM 06/25/2023    2:58 PM 03/26/2023    2:33 PM  CBC  WBC 4.0 - 10.5 K/uL 5.1  4.8  5.0   Hemoglobin 13.0 - 17.0 g/dL 84.8  84.6  85.4   Hematocrit 39.0 - 52.0 % 43.5  45.0  43.2   Platelets 150 - 400 K/uL 216  209  207        Latest Ref Rng & Units 01/26/2024    9:28 AM 06/25/2023    2:58 PM 03/26/2023    2:33 PM  CMP  Glucose 70 - 99 mg/dL 886  95  884   BUN 8 - 23 mg/dL 16  19  18    Creatinine 0.61 - 1.24 mg/dL 8.78  8.82  8.91   Sodium 135 - 145 mmol/L 141  141  140   Potassium 3.5 - 5.1 mmol/L 4.1  3.7  3.7   Chloride 98 - 111 mmol/L 108  107  108   CO2 22 - 32 mmol/L 26  26  26    Calcium 8.9 - 10.3 mg/dL 9.3  9.7  9.4   Total Protein 6.5 - 8.1 g/dL 6.6  7.1  6.6   Total Bilirubin 0.0 - 1.2 mg/dL 0.5  0.9  0.5   Alkaline Phos 38 - 126 U/L 76  76  72   AST 15 - 41 U/L 14  18  14    ALT 0 - 44 U/L 14  17  13      RADIOGRAPHIC STUDIES: CT CHEST ABDOMEN PELVIS W CONTRAST Result Date: 06/10/2024 CLINICAL DATA:  History of diffuse large B-cell lymphoma, surveillance. * Tracking Code: BO * EXAM: CT CHEST, ABDOMEN, AND  PELVIS WITH CONTRAST TECHNIQUE: Multidetector CT imaging of the chest, abdomen and pelvis was performed following the standard protocol during bolus administration of intravenous contrast. RADIATION DOSE REDUCTION: This exam was performed according to the departmental dose-optimization program which includes automated exposure control, adjustment of the mA and/or kV according to patient size and/or use of iterative reconstruction technique. CONTRAST:  OMNIPAQUE  IOHEXOL  300 MG/ML  SOLN COMPARISON:  Multiple priors including CT May 29, 2023 FINDINGS: CT CHEST FINDINGS Cardiovascular: Normal caliber thoracic aorta. Normal size heart. No significant pericardial effusion/thickening. Mediastinum/Nodes: No suspicious thyroid nodule. No pathologically enlarged mediastinal, hilar or axillary lymph nodes. Calcified subcarinal lymph node. The esophagus is grossly unremarkable. Lungs/Pleura: No suspicious pulmonary nodules or masses. Bilateral lower lobe calcified granuloma. Musculoskeletal: No aggressive lytic or blastic lesion of bone. Surgical fixation anchor in the left humeral head. CT ABDOMEN PELVIS FINDINGS Hepatobiliary: Diffuse hepatic steatosis. No suspicious hepatic lesion. Gallbladder is unremarkable. No biliary ductal dilation. Pancreas: No pancreatic ductal dilation or evidence of acute inflammation. Spleen: Calcified splenic granulomata. Adrenals/Urinary Tract: No suspicious adrenal nodule/mass. Punctate nonobstructive left lower pole renal stone. No obstructive ureteral or bladder calculi. Urinary bladder is unremarkable for degree of distension. Stomach/Bowel: Stomach is unremarkable for degree of distension. Scattered colonic diverticulosis. Noninflamed appendix. Vascular/Lymphatic: Aortic atherosclerosis. Smooth IVC contours. No pathologically enlarged abdominal or pelvic lymph nodes. Reproductive: Enlarged prostate gland. Other: No significant abdominopelvic free fluid. Fat containing umbilical  hernia. Musculoskeletal: No aggressive lytic or blastic lesion of bone. IMPRESSION: 1. No evidence of recurrent lymphoma in the chest, abdomen or pelvis. 2. Diffuse hepatic steatosis. 3. Punctate nonobstructive left lower pole renal stone. 4. Enlarged prostate gland. 5. Aortic atherosclerosis. Aortic Atherosclerosis (ICD10-I70.0). Electronically Signed  By: Reyes Holder M.D.   On: 06/10/2024 12:13    ASSESSMENT & PLAN Steve Smith is a 63 y.o.  male with medical history significant for Stage IV DLBCL who presents for a follow up visit.  After review the labs, the records, schedule the patient the findings most consistent with a diffuse large B-cell lymphoma with staging in process.  We completed the staging with a PET CT scan which showed involvement of the liver and spleen.  At this time his disease does appear as late stage and we will plan for at least 6 cycles of R-CHOP followed by repeat scans.   Previously we discussed the risks and benefits of R-CHOP chemotherapy.  We noted the nature of diffuse large B-cell lymphoma and the possibility of achieving a complete remission with this chemotherapy regimen.  I also noted the side effects which include but are not limited to nausea, vomiting, hair loss, diarrhea, sedation, insomnia, hyperglycemia, high blood pressure, and cardiotoxicity.  The patient voices understanding of the risk and benefits and was agreeable to proceeding with treatment at this time.   The R-CHOP regimen consisted of rituximab  375 mg per metered squared IV on day 1, cyclophosphamide  750 mg per metered squared on day 1, doxorubicin  50 mg/m on day 1, vincristine  1.4 mg per metered squared with a max dose of 2 mg on day 1, and prednisone  100 mg orally days 1 through 5.  A cycle lasts for 21 days and number of cycles planned is 6.   IPI Score: 3 points. (53% progression free survival).   #Diffuse Large B Cell Lymphoma, Stage IV --Findings at this time are most consistent with a  Stage IV diffuse large B-cell lymphoma  He is Stage IV, required 6 cycles of therapy.  --Baseline labs to include CBC, CMP, uric acid and LDH. --patient completed 6 cycles of R-CHOP with complete metabolic response noted on post treatment PET CT scan from 05/29/2021.  Plan: --per NCCN guidelines, plan for clinic visits q 6-12 months for the next 3 years, with q 12 month CT scans.  --Most recent CT scan on 05/29/2023 show no evidence of residual or recurrent disease.  --Labs today show white blood cell count 5.1, hemoglobin 15.1, MCV 89.7, and platelets of 216. Creatinine and LFTs in range.  --No clinical signs or symptoms of relapse.  --RTC in 6 months with labs and follow up. Plan for repeat CT scan in September 2025 (two weeks prior to visit).  # Contrast Allergy -- Developed welts and hives after his last CT scan on 05/28/2022. -- Patient will require premedication prior to CT scans moving forward. Sent refill for next scan.     No orders of the defined types were placed in this encounter.   All questions were answered. The patient knows to call the clinic with any problems, questions or concerns.   I have spent a total of 25 minutes minutes of face-to-face and non-face-to-face time, preparing to see the patient, performing a medically appropriate examination, counseling and educating the patient, ordering medications/tests/procedures, documenting clinical information in the electronic health record, independently interpreting results and communicating results to the patient, and care coordination.   Norleen IVAR Kidney, MD Department of Hematology/Oncology [facility] at [facility] Phone: [phone] Pager: [phone] Email: [email or web address]    07/08/2024 7:13 AM

## 2024-09-30 ENCOUNTER — Ambulatory Visit
Admission: RE | Admit: 2024-09-30 | Discharge: 2024-09-30 | Disposition: A | Discharge status: Home / Self Care | Source: Ambulatory Visit | Attending: Student

## 2024-09-30 ENCOUNTER — Encounter: Payer: Self-pay | Admitting: Hematology and Oncology

## 2024-09-30 VITALS — BP 182/101 | HR 92 | Temp 98.6°F | Resp 16

## 2024-09-30 DIAGNOSIS — J0100 Acute maxillary sinusitis, unspecified: Secondary | ICD-10-CM | POA: Diagnosis not present

## 2024-09-30 MED ORDER — ERYTHROMYCIN 5 MG/GM OP OINT: TOPICAL_OINTMENT | OPHTHALMIC | 0 refills | Status: AC

## 2024-09-30 MED ORDER — CEFDINIR 300 MG PO CAPS: 300.0000 mg | ORAL_CAPSULE | Freq: Two times a day (BID) | ORAL | 0 refills | Status: AC

## 2024-09-30 NOTE — Discharge Instructions (Addendum)
-  For sinus infection, Omnicef twice daily x7 days -We are treating your left eye with an antibiotic ointment called erythromycin.  Use this once nightly for about 7 days.  Pull down the lower eyelid, and place about half an inch inside.  This will be messy, so press the remaining ointment around the eye.  You can wash your face with gentle soap and water in the morning to wash off any remaining ointment. -Warm compresses as needed -Seek additional medical attention if symptoms get worse, like eye pain, eye lid swelling, vision changes. Follow-up with your eye doctor if possible, but we're also happy to see you!

## 2024-09-30 NOTE — ED Provider Notes (Signed)
 " [facility]    CSN: [id number] Arrival date & time: 09/30/24  0856      History   Chief Complaint Chief Complaint  Patient presents with   Nasal Congestion    Entered by patient    HPI Steve Smith is a 64 y.o. male presenting w congestion x1 week.   Pt reports congestion, sinus drainage, cough,headache, facial pressure below eyes x 1 week. Denies fever sore throat. Now has redness in the left eye x 2 days. States minimal crusting. Pt has taken several otc meds but has found no relief.  Does not wear contacts or glasses.      HPI  Past Medical History:  Diagnosis Date   Basal cell carcinoma    Hypertension    Kidney stone     Patient Active Problem List   Diagnosis Date Noted   Port-A-Cath in place 02/11/2021   DLBCL (diffuse large B cell lymphoma) (HCC) 01/08/2021   Wound dehiscence, surgical 01/03/2021   Lymphadenopathy, inguinal 12/20/2020   LACERATION, HAND 08/08/2008    Past Surgical History:  Procedure Laterality Date   INGUINAL LYMPH NODE BIOPSY Left 12/26/2020   Procedure: LEFT INGUINAL EXCISIONAL  LYMPH NODE BIOPSY;  Surgeon: Kallie Manuelita BROCKS, MD;  Location: [facility];  Service: General;  Laterality: Left;   IR IMAGING GUIDED PORT INSERTION  01/17/2021   IR REMOVAL TUN ACCESS W/ PORT W/O FL MOD SED  06/24/2021   ROTATOR CUFF REPAIR     Left       Home Medications    Prior to Admission medications  Medication Sig Start Date End Date Taking? Authorizing Provider  cefdinir (OMNICEF) 300 MG capsule Take 1 capsule (300 mg total) by mouth 2 (two) times daily for 7 days. 09/30/24 10/07/24 Yes Yazan Gatling E, PA-C  erythromycin ophthalmic ointment Place a 1/2 inch ribbon of ointment into the lower eyelid at bedtime. 09/30/24 10/07/24 Yes Eron Staat E, PA-C  diphenhydrAMINE  (BENADRYL ) 50 MG tablet Take 1 tablet (50 mg total) by mouth at bedtime as needed for itching. Take 1 hour prior to CT scan. 11/17/22   Dorsey, John T IV, MD  olmesartan (BENICAR)  40 MG tablet Take 40 mg by mouth daily.    [provider]  predniSONE  (DELTASONE ) 50 MG tablet Take 1 pill 13 hours prior to CT scan, the next 7 hours prior to CT scan, and the last 1 hour (with benadryl ) prior to CT scan. 06/06/24   Federico Norleen ONEIDA MADISON, MD    Family History Family History  Problem Relation Age of Onset   Basal cell carcinoma Mother    Liver cancer Father     Social History Social History[1]   Allergies   Ivp dye [iodinated contrast media]   Review of Systems Review of Systems  Constitutional:  Negative for appetite change, chills and fever.  HENT:  Positive for congestion and sinus pressure. Negative for ear pain, rhinorrhea, sinus pain and sore throat.   Eyes:  Positive for redness. Negative for visual disturbance.  Respiratory:  Negative for cough, chest tightness, shortness of breath and wheezing.   Cardiovascular:  Negative for chest pain and palpitations.  Gastrointestinal:  Negative for abdominal pain, constipation, diarrhea, nausea and vomiting.  Genitourinary:  Negative for dysuria, frequency and urgency.  Musculoskeletal:  Negative for myalgias.  Neurological:  Negative for dizziness, weakness and headaches.  Psychiatric/Behavioral:  Negative for confusion.   All other systems reviewed and are negative.    Physical  Exam Triage Vital Signs ED Triage Vitals  Encounter Vitals Group     BP 09/30/24 0913 (!) 182/101     Girls Systolic BP Percentile --      Girls Diastolic BP Percentile --      Boys Systolic BP Percentile --      Boys Diastolic BP Percentile --      Pulse Rate 09/30/24 0913 92     Resp 09/30/24 0913 16     Temp 09/30/24 0913 98.6 F (37 C)     Temp Source 09/30/24 0913 Oral     SpO2 09/30/24 0913 94 %     Weight --      Height --      Head Circumference --      Peak Flow --      Pain Score 09/30/24 0915 0     Pain Loc --      Pain Education --      Exclude from Growth Chart --    No data found.  Updated Vital  Signs BP (!) 182/101 (BP Location: Right Arm)   Pulse 92   Temp 98.6 F (37 C) (Oral)   Resp 16   SpO2 94%   Visual Acuity Right Eye Distance:   Left Eye Distance:   Bilateral Distance:    Right Eye Near:   Left Eye Near:    Bilateral Near:     Physical Exam Vitals reviewed.  Constitutional:      General: He is not in acute distress.    Appearance: Normal appearance. He is not ill-appearing.  HENT:     Head: Normocephalic and atraumatic.     Right Ear: Tympanic membrane, ear canal and external ear normal. No tenderness. No middle ear effusion. There is no impacted cerumen. Tympanic membrane is not perforated, erythematous, retracted or bulging.     Left Ear: Tympanic membrane, ear canal and external ear normal. No tenderness.  No middle ear effusion. There is no impacted cerumen. Tympanic membrane is not perforated, erythematous, retracted or bulging.     Nose: No congestion.     Right Sinus: Maxillary sinus tenderness present.     Left Sinus: Maxillary sinus tenderness present.     Mouth/Throat:     Mouth: Mucous membranes are moist.     Pharynx: Uvula midline. No oropharyngeal exudate or posterior oropharyngeal erythema.     Tonsils: No tonsillar exudate.  Eyes:     Extraocular Movements: Extraocular movements intact.     Pupils: Pupils are equal, round, and reactive to light.     Comments: Trace L conjunctival injection. No crusting. No lid changes.   Cardiovascular:     Rate and Rhythm: Normal rate and regular rhythm.     Heart sounds: Normal heart sounds.  Pulmonary:     Effort: Pulmonary effort is normal.     Breath sounds: Normal breath sounds. No decreased breath sounds, wheezing, rhonchi or rales.  Abdominal:     Palpations: Abdomen is soft.     Tenderness: There is no abdominal tenderness. There is no guarding or rebound.  Lymphadenopathy:     Cervical: No cervical adenopathy.     Right cervical: No superficial, deep or posterior cervical adenopathy.    Left  cervical: No superficial, deep or posterior cervical adenopathy.  Skin:    Comments: No rash   Neurological:     General: No focal deficit present.     Mental Status: He is alert and oriented to person, place,  and time.  Psychiatric:        Mood and Affect: Mood normal.        Behavior: Behavior normal.        Thought Content: Thought content normal.        Judgment: Judgment normal.      UC Treatments / Results  Labs (all labs ordered are listed, but only abnormal results are displayed) Labs Reviewed - No data to display  EKG   Radiology No results found.  Procedures Procedures (including critical care time)  Medications Ordered in UC Medications - No data to display  Initial Impression / Assessment and Plan / UC Course  I have reviewed the triage vital signs and the nursing notes.  Pertinent labs & imaging results that were available during my care of the patient were reviewed by me and considered in my medical decision making (see chart for details).     Patient is a pleasant 64 y.o. male presenting with sinusitis and L upper blepharitis following viral URI. The patient is afebrile and nontachycardic.  Antipyretic has not been administered today.  Omnicef sent, as the patient has an intolerance to Augmentin (nausea, vomiting).  Erythromycin ointment sent.  Final Clinical Impressions(s) / UC Diagnoses   Final diagnoses:  Acute non-recurrent maxillary sinusitis     Discharge Instructions      -For sinus infection, Omnicef twice daily x7 days -We are treating your left eye with an antibiotic ointment called erythromycin.  Use this once nightly for about 7 days.  Pull down the lower eyelid, and place about half an inch inside.  This will be messy, so press the remaining ointment around the eye.  You can wash your face with gentle soap and water in the morning to wash off any remaining ointment. -Warm compresses as needed -Seek additional medical attention if  symptoms get worse, like eye pain, eye lid swelling, vision changes. Follow-up with your eye doctor if possible, but we're also happy to see you!      ED Prescriptions     Medication Sig Dispense Auth. Provider   cefdinir (OMNICEF) 300 MG capsule Take 1 capsule (300 mg total) by mouth 2 (two) times daily for 7 days. 14 capsule Gorgeous Newlun E, PA-C   erythromycin ophthalmic ointment Place a 1/2 inch ribbon of ointment into the lower eyelid at bedtime. 3.5 g Rebekah Sprinkle E, PA-C      PDMP not reviewed this encounter.     [1]  Social History Tobacco Use   Smoking status: Never   Smokeless tobacco: Never  Substance Use Topics   Alcohol use: No   Drug use: No     Arlyss Leita BRAVO, PA-C 09/30/24 1030  "

## 2024-09-30 NOTE — ED Triage Notes (Addendum)
 Pt reports congestion, sinus drainage, cough,headache facial pressure x 1 week denies fever sore throat, now has redness in the left eye x 2 days. Pt has taken several otc meds but has found no relief.
